# Patient Record
Sex: Male | Born: 1940 | Race: White | Hispanic: No | Smoking: Former smoker
Health system: Southern US, Community
[De-identification: ages and names within clinical notes are randomized; demographics above are authoritative.]

## PROBLEM LIST (undated history)

## (undated) DIAGNOSIS — J349 Unspecified disorder of nose and nasal sinuses: Secondary | ICD-10-CM

## (undated) DIAGNOSIS — M1990 Unspecified osteoarthritis, unspecified site: Secondary | ICD-10-CM

## (undated) DIAGNOSIS — R069 Unspecified abnormalities of breathing: Secondary | ICD-10-CM

## (undated) DIAGNOSIS — M67919 Unspecified disorder of synovium and tendon, unspecified shoulder: Secondary | ICD-10-CM

## (undated) DIAGNOSIS — N400 Enlarged prostate without lower urinary tract symptoms: Secondary | ICD-10-CM

## (undated) DIAGNOSIS — I10 Essential (primary) hypertension: Secondary | ICD-10-CM

## (undated) DIAGNOSIS — R0602 Shortness of breath: Secondary | ICD-10-CM

## (undated) DIAGNOSIS — C801 Malignant (primary) neoplasm, unspecified: Secondary | ICD-10-CM

## (undated) DIAGNOSIS — K635 Polyp of colon: Secondary | ICD-10-CM

## (undated) HISTORY — DX: Shortness of breath: R06.02

## (undated) HISTORY — DX: Unspecified abnormalities of breathing: R06.9

## (undated) HISTORY — PX: TONSILLECTOMY: SUR1361

## (undated) HISTORY — DX: Essential (primary) hypertension: I10

## (undated) HISTORY — DX: Unspecified disorder of nose and nasal sinuses: J34.9

## (undated) HISTORY — DX: Polyp of colon: K63.5

---

## 2005-05-31 ENCOUNTER — Ambulatory Visit: Payer: Self-pay | Admitting: Internal Medicine

## 2005-07-24 ENCOUNTER — Ambulatory Visit: Payer: Self-pay | Admitting: Internal Medicine

## 2005-07-31 ENCOUNTER — Ambulatory Visit: Payer: Self-pay | Admitting: Internal Medicine

## 2005-08-21 ENCOUNTER — Ambulatory Visit: Payer: Self-pay | Admitting: Internal Medicine

## 2005-08-23 ENCOUNTER — Ambulatory Visit: Payer: Self-pay | Admitting: Gastroenterology

## 2005-09-17 ENCOUNTER — Ambulatory Visit: Payer: Self-pay | Admitting: Gastroenterology

## 2005-09-17 ENCOUNTER — Encounter (INDEPENDENT_AMBULATORY_CARE_PROVIDER_SITE_OTHER): Payer: Self-pay | Admitting: *Deleted

## 2005-10-02 ENCOUNTER — Ambulatory Visit: Payer: Self-pay | Admitting: Internal Medicine

## 2005-11-19 ENCOUNTER — Ambulatory Visit: Payer: Self-pay | Admitting: Internal Medicine

## 2005-12-04 ENCOUNTER — Ambulatory Visit: Admission: RE | Admit: 2005-12-04 | Discharge: 2005-12-04 | Payer: Self-pay | Admitting: Orthopedic Surgery

## 2006-02-04 ENCOUNTER — Inpatient Hospital Stay (HOSPITAL_COMMUNITY): Admission: RE | Admit: 2006-02-04 | Discharge: 2006-02-07 | Payer: Self-pay | Admitting: Orthopedic Surgery

## 2006-08-14 ENCOUNTER — Ambulatory Visit: Payer: Self-pay | Admitting: Internal Medicine

## 2006-09-26 ENCOUNTER — Telehealth: Payer: Self-pay | Admitting: Internal Medicine

## 2006-10-04 ENCOUNTER — Telehealth: Payer: Self-pay | Admitting: Internal Medicine

## 2006-10-07 ENCOUNTER — Ambulatory Visit: Payer: Self-pay | Admitting: Internal Medicine

## 2006-10-07 DIAGNOSIS — F411 Generalized anxiety disorder: Secondary | ICD-10-CM | POA: Insufficient documentation

## 2006-10-07 DIAGNOSIS — Z87898 Personal history of other specified conditions: Secondary | ICD-10-CM | POA: Insufficient documentation

## 2006-10-08 ENCOUNTER — Telehealth: Payer: Self-pay | Admitting: Internal Medicine

## 2006-10-11 ENCOUNTER — Ambulatory Visit: Payer: Self-pay | Admitting: Internal Medicine

## 2006-10-14 ENCOUNTER — Ambulatory Visit: Payer: Self-pay | Admitting: Internal Medicine

## 2006-10-14 LAB — CONVERTED CEMR LAB
Basophils Absolute: 0 10*3/uL (ref 0.0–0.1)
Blood in Urine, dipstick: NEGATIVE
Calcium: 8.4 mg/dL (ref 8.4–10.5)
Chloride: 99 meq/L (ref 96–112)
Ferritin: 25.7 ng/mL (ref 22.0–322.0)
GFR calc Af Amer: 145 mL/min
GFR calc non Af Amer: 120 mL/min
GFR calc non Af Amer: 102 mL/min
Glucose, Bld: 103 mg/dL — ABNORMAL HIGH (ref 70–99)
Glucose, Bld: 93 mg/dL (ref 70–99)
Glucose, Urine, Semiquant: NEGATIVE
Hemoglobin: 13.5 g/dL (ref 13.0–17.0)
Hemoglobin: 14 g/dL (ref 13.0–17.0)
Lymphocytes Relative: 21.4 % (ref 12.0–46.0)
MCHC: 35.4 g/dL (ref 30.0–36.0)
Monocytes Absolute: 0.6 10*3/uL (ref 0.2–0.7)
Monocytes Relative: 10.8 % (ref 3.0–11.0)
Neutro Abs: 3.4 10*3/uL (ref 1.4–7.7)
Nitrite: NEGATIVE
Protein, U semiquant: NEGATIVE
Sodium: 126 meq/L — ABNORMAL LOW (ref 135–145)
Vitamin B-12: 282 pg/mL (ref 211–911)
WBC Urine, dipstick: NEGATIVE
pH: 7.5

## 2006-10-22 ENCOUNTER — Telehealth: Payer: Self-pay | Admitting: Internal Medicine

## 2007-03-06 HISTORY — PX: JOINT REPLACEMENT: SHX530

## 2007-03-06 HISTORY — PX: KNEE ARTHROPLASTY: SHX992

## 2007-03-24 ENCOUNTER — Ambulatory Visit: Payer: Self-pay | Admitting: Internal Medicine

## 2007-03-24 LAB — CONVERTED CEMR LAB
Blood in Urine, dipstick: NEGATIVE
Glucose, Urine, Semiquant: NEGATIVE
Specific Gravity, Urine: 1.015
WBC Urine, dipstick: NEGATIVE
pH: 7

## 2007-03-25 DIAGNOSIS — M1990 Unspecified osteoarthritis, unspecified site: Secondary | ICD-10-CM | POA: Insufficient documentation

## 2007-04-08 ENCOUNTER — Telehealth: Payer: Self-pay | Admitting: Internal Medicine

## 2007-04-28 ENCOUNTER — Telehealth: Payer: Self-pay | Admitting: Internal Medicine

## 2007-06-27 ENCOUNTER — Ambulatory Visit: Payer: Self-pay | Admitting: Internal Medicine

## 2007-06-27 DIAGNOSIS — E538 Deficiency of other specified B group vitamins: Secondary | ICD-10-CM | POA: Insufficient documentation

## 2007-06-27 DIAGNOSIS — E559 Vitamin D deficiency, unspecified: Secondary | ICD-10-CM | POA: Insufficient documentation

## 2007-09-10 ENCOUNTER — Telehealth: Payer: Self-pay | Admitting: *Deleted

## 2007-09-18 ENCOUNTER — Encounter: Payer: Self-pay | Admitting: Internal Medicine

## 2007-10-16 ENCOUNTER — Telehealth: Payer: Self-pay | Admitting: Internal Medicine

## 2007-10-23 ENCOUNTER — Telehealth: Payer: Self-pay | Admitting: Internal Medicine

## 2007-11-11 ENCOUNTER — Encounter: Payer: Self-pay | Admitting: Internal Medicine

## 2007-11-18 ENCOUNTER — Telehealth: Payer: Self-pay | Admitting: Internal Medicine

## 2007-11-25 ENCOUNTER — Telehealth: Payer: Self-pay | Admitting: Internal Medicine

## 2007-12-16 ENCOUNTER — Telehealth: Payer: Self-pay | Admitting: Internal Medicine

## 2008-01-02 ENCOUNTER — Ambulatory Visit: Payer: Self-pay | Admitting: Internal Medicine

## 2008-01-02 ENCOUNTER — Encounter: Payer: Self-pay | Admitting: Internal Medicine

## 2008-01-15 ENCOUNTER — Telehealth: Payer: Self-pay | Admitting: *Deleted

## 2008-01-19 ENCOUNTER — Ambulatory Visit: Payer: Self-pay | Admitting: Internal Medicine

## 2008-01-25 ENCOUNTER — Encounter: Payer: Self-pay | Admitting: Internal Medicine

## 2008-02-26 ENCOUNTER — Ambulatory Visit: Payer: Self-pay | Admitting: Family Medicine

## 2008-04-13 ENCOUNTER — Telehealth (INDEPENDENT_AMBULATORY_CARE_PROVIDER_SITE_OTHER): Payer: Self-pay | Admitting: *Deleted

## 2008-04-16 ENCOUNTER — Telehealth (INDEPENDENT_AMBULATORY_CARE_PROVIDER_SITE_OTHER): Payer: Self-pay | Admitting: *Deleted

## 2008-04-22 ENCOUNTER — Encounter: Payer: Self-pay | Admitting: Adult Health

## 2008-04-28 ENCOUNTER — Encounter: Payer: Self-pay | Admitting: Internal Medicine

## 2008-06-09 ENCOUNTER — Telehealth: Payer: Self-pay | Admitting: Internal Medicine

## 2008-08-18 ENCOUNTER — Ambulatory Visit: Payer: Self-pay | Admitting: Family Medicine

## 2008-08-18 DIAGNOSIS — N433 Hydrocele, unspecified: Secondary | ICD-10-CM | POA: Insufficient documentation

## 2008-10-08 ENCOUNTER — Ambulatory Visit: Payer: Self-pay | Admitting: Family Medicine

## 2009-01-26 ENCOUNTER — Ambulatory Visit: Payer: Self-pay | Admitting: Family Medicine

## 2009-01-31 LAB — CONVERTED CEMR LAB
ALT: 21 units/L (ref 0–53)
AST: 21 units/L (ref 0–37)
BUN: 8 mg/dL (ref 6–23)
Basophils Absolute: 0 10*3/uL (ref 0.0–0.1)
Bilirubin, Direct: 0.1 mg/dL (ref 0.0–0.3)
Calcium: 8.7 mg/dL (ref 8.4–10.5)
Cholesterol: 150 mg/dL (ref 0–200)
Creatinine, Ser: 0.8 mg/dL (ref 0.4–1.5)
Eosinophils Relative: 3.4 % (ref 0.0–5.0)
GFR calc non Af Amer: 101.68 mL/min (ref >60)
HDL: 27.1 mg/dL — ABNORMAL LOW (ref >39.00)
LDL Cholesterol: 110 mg/dL — ABNORMAL HIGH (ref 0–99)
Lymphocytes Relative: 21.5 % (ref 12.0–46.0)
Monocytes Relative: 9.9 % (ref 3.0–12.0)
Neutrophils Relative %: 64.9 % (ref 43.0–77.0)
Platelets: 220 10*3/uL (ref 150.0–400.0)
Potassium: 4.2 meq/L (ref 3.5–5.1)
RDW: 11.6 % (ref 11.5–14.6)
Total Bilirubin: 1.1 mg/dL (ref 0.3–1.2)
VLDL: 13.2 mg/dL (ref 0.0–40.0)
Vitamin B-12: 230 pg/mL (ref 211–911)
WBC: 5.2 10*3/uL (ref 4.5–10.5)

## 2009-12-08 ENCOUNTER — Ambulatory Visit: Payer: Self-pay | Admitting: Family Medicine

## 2009-12-08 DIAGNOSIS — N400 Enlarged prostate without lower urinary tract symptoms: Secondary | ICD-10-CM | POA: Insufficient documentation

## 2009-12-12 LAB — CONVERTED CEMR LAB: PSA: 0.36 ng/mL (ref 0.10–4.00)

## 2009-12-14 ENCOUNTER — Ambulatory Visit: Payer: Self-pay | Admitting: Family Medicine

## 2009-12-16 ENCOUNTER — Ambulatory Visit: Payer: Self-pay | Admitting: Family Medicine

## 2010-04-04 NOTE — Assessment & Plan Note (Signed)
Summary: health examination form to be completed/njr   Vital Signs:  Patient profile:   70 year old male Weight:      176 pounds O2 Sat:      98 % Temp:     98 degrees F Pulse rate:   78 / minute BP sitting:   140 / 90  Vitals Entered By: Pura Spice, RN (December 08, 2009 9:41 AM) CC: form completion  for substituting  school system    History of Present Illness: Here for a prostate check and to have a form filled out for [facility]. He feels fine and has no concerns. He is urinating freely. He has not seen Dr. Vonita Moss for over a year.   Allergies (verified): No Known Drug Allergies  Past History:  Past Medical History: Anxiety COPD by Cxray?  see normal   Spirometry  2009    on no meds Osteoarthritis  knees ? RLS Benign prostatic hypertrophy  Past Surgical History: Reviewed history from 08/18/2008 and no changes required. right Total knee replacement 2007 per Dr. Turner Daniels  Family History: Reviewed history from 01/19/2008 and no changes required. unremarkable Neg for lung disease  Social History: Reviewed history from 01/19/2008 and no changes required. stopped pipe smoking 6 months ago.  Alcohol use-yes  Married  Review of Systems  The patient denies anorexia, fever, weight loss, weight gain, vision loss, decreased hearing, hoarseness, chest pain, syncope, dyspnea on exertion, peripheral edema, prolonged cough, headaches, hemoptysis, abdominal pain, melena, hematochezia, severe indigestion/heartburn, hematuria, incontinence, genital sores, muscle weakness, suspicious skin lesions, transient blindness, difficulty walking, depression, unusual weight change, abnormal bleeding, enlarged lymph nodes, angioedema, breast masses, and testicular masses.    Physical Exam  General:  Well-developed,well-nourished,in no acute distress; alert,appropriate and cooperative throughout examination Neck:  No deformities, masses, or tenderness noted. Lungs:  Normal  respiratory effort, chest expands symmetrically. Lungs are clear to auscultation, no crackles or wheezes. Heart:  Normal rate and regular rhythm with an occasional ectopic beat.  S1 and S2 normal without gallop, murmur, click, rub or other extra sounds. Abdomen:  Bowel sounds positive,abdomen soft and non-tender without masses, organomegaly or hernias noted. Rectal:  No external abnormalities noted. Normal sphincter tone. No rectal masses or tenderness. Prostate:  no nodules, no asymmetry, no induration, and 1+ enlarged.   Msk:  No deformity or scoliosis noted of thoracic or lumbar spine.   Pulses:  R and L carotid,radial,femoral,dorsalis pedis and posterior tibial pulses are full and equal bilaterally Extremities:  No clubbing, cyanosis, edema, or deformity noted with normal full range of motion of all joints.   Neurologic:  No cranial nerve deficits noted. Station and gait are normal. Plantar reflexes are down-going bilaterally. DTRs are symmetrical throughout. Sensory, motor and coordinative functions appear intact.   Impression & Recommendations:  Problem # 1:  BENIGN PROSTATIC HYPERTROPHY (ICD-600.00)  Orders: Venipuncture ([address]) TLB-PSA (Prostate Specific Antigen) (84153-PSA) Specimen Handling (60454)  Problem # 2:  WEAKNESS (ICD-780.79)  Problem # 3:  OSTEOARTHRITIS (ICD-715.90)  Complete Medication List: 1)  Flomax 0.4 Mg Xr24h-cap (Tamsulosin hcl) .... Once daily  Other Orders: Tdap => 87yrs IM (09811) Admin 1st Vaccine (91478)  Patient Instructions: 1)  Form was filled out. He will return next week for a PPD.    Immunizations Administered:  Tetanus Vaccine:    Vaccine Type: Tdap    Site: left deltoid    Mfr: GlaxoSmithKline    Dose: 0.5 ml    Route: IM  Given by: Pura Spice, RN    Exp. Date: 12/23/2011    Lot #: [id number]    VIS given: 01/21/08 version given December 08, 2009.

## 2010-04-04 NOTE — Letter (Signed)
Summary: Health Examination Certificate  Health Examination Certificate   Imported By: Maryln Gottron 12/20/2009 12:28:53  _____________________________________________________________________  External Attachment:    Type:   Image     Comment:   External Document

## 2010-04-04 NOTE — Assessment & Plan Note (Signed)
Summary: TB test pt will come in around 430pm/njr   Nurse Visit   Allergies: No Known Drug Allergies  Immunizations Administered:  PPD Skin Test:    Vaccine Type: PPD    Site: left forearm    Mfr: Sanofi Pasteur    Dose: 0.1 ml    Route: ID    Given by: Pura Spice, RN    Exp. Date: 01/04/2010    Lot #: [id number]  Orders Added: 1)  TB Skin Test [86580] 2)  Admin 1st Vaccine [44034]

## 2010-04-04 NOTE — Assessment & Plan Note (Signed)
Summary: TB READING//ALP   Nurse Visit   Allergies: No Known Drug Allergies  PPD Results    Date of reading: 12/16/2009    Results: < 5mm    Interpretation: negative

## 2010-05-09 ENCOUNTER — Other Ambulatory Visit: Payer: Self-pay | Admitting: Dermatology

## 2010-07-21 NOTE — Op Note (Signed)
Shawn Gaines, Shawn Gaines                  ACCOUNT NO.:  [id number]   MEDICAL RECORD NO.:  [id number]          PATIENT TYPE:  INP   LOCATION:  5015                         FACILITY:  [facility]   PHYSICIAN:  Feliberto Gottron. Turner Daniels, M.D.   DATE OF BIRTH:  02/14/1941   DATE OF PROCEDURE:  02/04/2006  DATE OF DISCHARGE:                               OPERATIVE REPORT   PREOPERATIVE DIAGNOSIS:  Osteoarthritis right knee.   POSTOPERATIVE DIAGNOSIS:  Osteoarthritis right knee.   PROCEDURE:  Right total knee arthroplasty using DePuy Sigma RP  components, 6 tibia, 5 femur, 10 posterior stabilized spacer and a 41 mm  patellar button.  DePuy HV cement with 1500 mg Zinacef.   SURGEON:  Feliberto Gottron. Turner Daniels, M.D.   ASSISTANTLaural Benes. Jannet Mantis.   ANESTHETIC:  General LMA.   ESTIMATED BLOOD LOSS:  Minimal.   FLUID REPLACEMENT:  Liter crystalloid.   URINE OUTPUT:  300 mL.   TOURNIQUET TIME:  1 hour and 35 minutes.   INDICATIONS FOR PROCEDURE:  70 year old man with end-stage arthritis of  both knees, right worse than left.  He has failed conservative treatment  and desires elective right total knee arthroplasty.  He is down to bone-  on-bone arthritic changes.  He has thought about this for quite some  time, many months to years.  He recently retired as a Runner, broadcasting/film/video.  We had a  thorough discussion of the pre and postoperative course and questions  were answered.  Risks and benefits of surgery were reviewed as well.   DESCRIPTION OF PROCEDURE:  The patient identified by armband, taken the  operating room at [facility].  Appropriate anesthetic monitors  were attached and general LMA anesthesia induced with the patient in  supine position.  Foley catheter was inserted.  Lateral post applied to  the table and foot positioner and tourniquet applied high the right  thigh. The right lower extremity was then prepped, draped in usual  sterile fashion from the toes to the tourniquet and it should be noted  that  just prior to coming into the operative suite.  He did receive a  gram of Ancef IV.  The limb was then wrapped with an Esmarch bandage.  The tourniquet inflated 350 mmHg with the knee bent.  We made an  anterior midline incision 18 cm in length starting out a centimeter  distal to and a centimeter medial to the tibial tubercle going up over  the patella and proximal to the patella for about 5 or 6 cm through the  skin and subcutaneous tissue down the level of transverse retinaculum  which was reflected medially.  Allowing a standard medial parapatellar  arthrotomy.  The prepatellar fat pad was resected.  The superficial  medial collateral ligament was elevated off the anteromedial tibia with  the Bovie with the knee in extension.  The patella was then everted and  the knee flexed up to 150 degrees.  This allowed resection of the rest  the prepatellar fat pad, the anterior one half of both menisci.  We  continued dissection  of the superficial medial collateral ligament  around posteriorly leaving it intact distally on the tibia to release  the superficial medial collateral ligament since he had a varus knee  deformity and some prominent medial osteophytes that were removed from  the tibia and the femur as well as lateral osteophytes.  The ACL and PCL  were then removed with electrocautery and the tibia translated  anteriorly using a McHale retractor through the notch, a lateral Homan a  posteromedial Z.  The proximal tibia was entered with the DePuy step  drill followed by the 0 degrees posterior slope cutting guide removing 3-  4 mm of bone medially and 8 to 9 mm laterally with the power oscillating  saw.  We then entered the distal femur 2 mm anterior to the PCL origin  with a step drill followed by 5 degrees right distal femoral cutting  guide set a 12 mm position because of his rather large size.  The tibia  sized out to a 6.  The cutting guide was pinned into place. 5 degrees  valgus,  right and the distal femoral cut accomplished, we measured for  #5 femoral component with the posterior referenced cutting guide and  pinned it in place in 3 degrees external rotation.  We then took the  chamfer cutting guide and hammered it into the pin holes and performed  our anterior, posterior and chamfer cuts without difficulty followed by  the Sigma RP box cut.  The everted patella was then measured with the  caliper 29 mm and we prepared the size for 41, set the cutting guide at  16 to remove 13 mm of bone, sized for 41 patellar button and drilled.  At this point the knee was once again hyperflexed.  Tibial retractors  placed and the 6 tibial baseplate was inserted, pinned into place.  Smokestack was then used to do the conical reaming.  We then inserted  the Delta fit keel trial.  At this point the #5 right trial femur was  hammered into place.  A 10 PS spacer was placed on the tibial baseplate  and a 41 poly button placed on the patella.  The knee came to full  extension flexed easily to 150 degrees and had slight ligamentous laxity  medially but then again we took the superficial medial collateral  ligament all the way around to the semimembranosus insertion to remove  osteophytes and this was not unexpected.  At this point the trial  components were removed.  All bony surfaces were water picked clean and  dry with suction and sponges, a double batch of DePuy HV cement with  1500 mg Zinacef was then mixed and applied to all bony and metallic  surfaces and in order we hammered into place, a #6 tibial baseplate and  removed excess cement, a #5 right femoral component removed excess  cement, 41 mm poly button and removed excess cement, and a 10 rotating  platform bearing was placed, the real one.  The knee was again reduced,  taken through range of motion, noted have excellent tracking.  Some more  excess cement was removed.  Medium hemostats were inserted in the lateral skin wound  and the parapatellar arthrotomy closed with running  #1 Vicryl suture.  The subcutaneous tissue with 0-0 and 2-0 undyed  Vicryl suture and skin with skin staples.  A dressing of Xeroform 4x4  dressing sponges, Webril and Ace wrap applied.  The tourniquet was let  down.  The  patient was then awakened and taken to the recovery room  without difficulty.      Feliberto Gottron. Turner Daniels, M.D.  Electronically Signed     FJR/MEDQ  D:  02/04/2006  T:  02/04/2006  Job:  [id number]

## 2010-07-21 NOTE — Letter (Signed)
November 30, 2005     Shawn Gaines, M.D.  [address]  [place], [place] [address]   RE:  KEEGHAN, BIALY  MRN:  [id number]  /  DOB:  Feb 26, 1941   Dear Dr. Thurston Gaines,   I have seen Mr. Heinrich Fertig, a 70 year old pipe-smoking married white  gentleman in my office, in regards to preoperative evaluation for a total  knee replacement.  He has no past history of cardiovascular disease or  documented pulmonary disease, although with his smoking an x-Tyjay done in  June 2007, did show some COPD.  His exercise, however, is limited only by  his degenerative joint disease.  He is not diabetic or hypertensive and has  no claudication symptoms.   PHYSICAL EXAMINATION:  VITAL SIGNS:  On exam in my office his weight was 184  pounds, temperature 97.1 degrees, pulse 70 with five or six premature beats  per minute, blood pressure 130/84.  CARDIOVASCULAR:  Within normal limits except for the premature beats on the  electrocardiogram.  He showed a normal sinus rhythm with unifocal PVCs.  He  was totally unaware of these symptoms.   I discussed with him preoperative medical rest and did not feel that his  PVCs on a baseline electrocardiogram were significant, based on his profile;  however, we did discuss perioperative beta blockers to decrease  cardiovascular complication risk.  He was given a trial of metoprolol 12.5  mg b.i.d., given 25 mg of a 30 that he can try this week and call us if he  has significant bradycardia.   I have told him that he should be a medically appropriate surgical candidate  for his upcoming total knee replacement.  Please let us know if we can help  you in any other way.    Sincerely,      Neta Mends. Fabian Sharp, MD     WKP/MedQ  DD:  11/30/2005  DT:  11/30/2005  Job #:  [id number]

## 2010-07-21 NOTE — Discharge Summary (Signed)
Shawn Gaines, Shawn Gaines                  ACCOUNT NO.:  [id number]   MEDICAL RECORD NO.:  [id number]          PATIENT TYPE:  INP   LOCATION:  5015                         FACILITY:  [facility]   PHYSICIAN:  Feliberto Gottron. Turner Daniels, M.D.   DATE OF BIRTH:  1940/11/17   DATE OF ADMISSION:  02/04/2006  DATE OF DISCHARGE:  02/07/2006                               DISCHARGE SUMMARY   PRIMARY DIAGNOSIS FOR THIS ADMISSION:  End-stage degenerative joint  disease of the right knee.   PROCEDURE WHILE IN HOSPITAL:  Right total knee arthroplasty.   HISTORY OF PRESENT ILLNESS:  The patient is a 70 year old male with end-  stage arthritis of both knees, right worse than left.  He has failed  conservative treatment and desires elective right total knee  arthroplasty.  He is down to bone on bone arthritic changes.  He has  thought about this for quite some time, many months to years.  He is  recently retired as a Runner, broadcasting/film/video.  After thorough discussion of the pre and  postoperative course and questions answered the patient wishes to  proceed knowing the risks and benefits of the surgery after review.   ALLERGIES:  THE PATIENT HAS NO KNOWN DRUG ALLERGIES.   MEDICATIONS AT TIME OF ADMISSION:  None.   PAST MEDICAL HISTORY:  __________  disease as well as a history of DJD.   SURGICAL HISTORY:  Tonsillectomy.   SOCIAL HISTORY:  Positive tobacco, a pipe per day, 4 to 5 alcoholic  beverages per week.  No IV drug abuse.  He is retired and married.   FAMILY HISTORY:  Mother died at age 77 with a history of DJD.  Father  died at 8 with a history of stroke.   REVIEW OF SYSTEMS:  Positive for glasses, decreased hearing, frequent  urination.  He denies any shortness of breath, chest pain or recent  illness.   PHYSICAL EXAMINATION:  VITAL SIGNS:  Pulse is 80, respirations 20,  temperature 97.7, blood pressure 150/80.  He is a 6 foot, 180 pound  male.  HEENT:  Head is normocephalic, atraumatic.  Ears:  TMs are clear.  Eyes:  Pupils equal, round, and reactive to light and accommodation.  Nose:  Patent.  Throat:  Benign.  He has partial dentures.  No difficulties  with gums.  NECK:  Supple.  Full range of motion.  CHEST:  Clear to auscultation and percussion.  HEART:  Regular rate and rhythm.  ABDOMEN:  Soft and nontender.  EXTREMITIES:  Right knee 5-110 degrees positive crepitus and trace  effusion, positive pain, stable with  __________ .   RADIOGRAPHS:  X-Dennis shows bone on bone changes of bilateral knees.   PREOPERATIVE LABS:  Including CBC, CT, chest x-Damarea, EKG, PT and PTT are  within normal limits with the exception of sodium of 129.   HOSPITAL COURSE:  On the day of admission the patient was taken to the  operating room at [facility] where he underwent a right total knee  arthroplasty and a few segment RP components, a #6 tibia, #5 femur, a 10  posterior, a stable S spacer and 41 mm patellar button.  All components  were cemented with components that had been impregnated with  antibiotics.  The patient was placed on perioperative antibiotics.  He  was placed on postoperative Coumadin prophylaxis with bridging Lovenox  treatment.  He was placed on postoperative PCA Dilaudid for pain  control.  A medium Hemovac drain was placed.  The physical therapy began  in the PACU with the CPM.  On postoperative day 1 the patient was awake  and alert.  No emesis, minimal nausea.  Vital signs were stable.  Hemoglobin 10.8, WBC of 8.8.  PT of 12.9.  Drain output of 550 mL over  shift, which was discontinued.  Dressing was clean and dry.  Neurovascularly intact.  He is, otherwise, stable and continuing with  physical therapy, as well as his Coumadin prophylaxis.  Postoperative  day 2, the patient was complaining of pain.  Vital signs were stable.  He was afebrile.  Hemoglobin 9.8, WBC 7.6.  INR 1.4.  Dressing was dry.  Abdomen was soft and nontender.  He was otherwise neurovascularly intact  distally.  PCO was  discontinued, IV and Foley was discontinued.  It was  felt that he would benefit from home physical therapy.  Postoperative  day 3 the patient was awake and alert, voiding without difficulty,  walking around the nursing station, taking p.o. as well.  He did  complain of increased swelling.  Vital signs were stable.  Hemoglobin  9.9, WBC of 10.1.  INR 1.6.  Wound was clean and dry.  CPM 0 to 50  degrees.  Urine output 3,500 mL.  The leg was 1 to 2+ swollen with no  increased pain with moving the foot up and down.  He was sent for venous  Dopplers to rule out DVT and this was negative.  Because of this and the  fact that he was otherwise medically stable and orthopedically improved,  he was discharge home in the care of his family.  At the time of  transfer, he was independent in his transfers and was progressing well  with physical therapy.  His pain was well controlled on oral pain  medicines and these will be continued on a home basis.  His home  medications were not an issue, as he had preoperatively.  He was given  prescriptions for Percocet, for Coumadin and for Robaxin.  He should  return to clinic in one weeks' time, sooner should he have any increased  fever over 101.  Activity and weightbearing as tolerated with total knee  precautions.  Diet is regular.  Patient to see [place] on one weeks' time for  followup check.      Laural Benes. Jannet Mantis.      Feliberto Gottron. Turner Daniels, M.D.  Electronically Signed    JBR/MEDQ  D:  03/25/2006  T:  03/26/2006  Job:  [id number]

## 2010-11-26 ENCOUNTER — Emergency Department (HOSPITAL_COMMUNITY)
Admission: EM | Admit: 2010-11-26 | Discharge: 2010-11-27 | Disposition: A | Discharge status: Home / Self Care | Payer: Medicare Other | Attending: Emergency Medicine | Admitting: Emergency Medicine

## 2010-11-26 ENCOUNTER — Emergency Department (HOSPITAL_COMMUNITY): Payer: Medicare Other

## 2010-11-26 DIAGNOSIS — T63461A Toxic effect of venom of wasps, accidental (unintentional), initial encounter: Secondary | ICD-10-CM | POA: Insufficient documentation

## 2010-11-26 DIAGNOSIS — T6391XA Toxic effect of contact with unspecified venomous animal, accidental (unintentional), initial encounter: Secondary | ICD-10-CM | POA: Insufficient documentation

## 2010-11-26 DIAGNOSIS — R070 Pain in throat: Secondary | ICD-10-CM | POA: Insufficient documentation

## 2010-11-26 DIAGNOSIS — R220 Localized swelling, mass and lump, head: Secondary | ICD-10-CM | POA: Insufficient documentation

## 2010-11-26 HISTORY — DX: Benign prostatic hyperplasia without lower urinary tract symptoms: N40.0

## 2010-11-27 ENCOUNTER — Encounter (HOSPITAL_COMMUNITY): Payer: Self-pay

## 2010-11-27 ENCOUNTER — Emergency Department (HOSPITAL_COMMUNITY): Payer: Medicare Other

## 2010-11-27 LAB — BASIC METABOLIC PANEL
BUN: 12 mg/dL (ref 6–23)
CO2: 27 mEq/L (ref 19–32)
Calcium: 8.6 mg/dL (ref 8.4–10.5)
Chloride: 98 mEq/L (ref 96–112)
Creatinine, Ser: 0.66 mg/dL (ref 0.50–1.35)

## 2010-11-27 MED ORDER — IOHEXOL 300 MG/ML  SOLN
75.0000 mL | Freq: Once | INTRAMUSCULAR | Status: AC | PRN
  Administered 2010-11-27: 75 mL via INTRAVENOUS

## 2011-05-03 ENCOUNTER — Encounter (HOSPITAL_COMMUNITY): Payer: Self-pay | Admitting: *Deleted

## 2011-05-03 ENCOUNTER — Other Ambulatory Visit: Payer: Self-pay | Admitting: Orthopedic Surgery

## 2011-05-03 ENCOUNTER — Other Ambulatory Visit (HOSPITAL_COMMUNITY): Payer: Self-pay | Admitting: *Deleted

## 2011-05-03 MED ORDER — CEFAZOLIN SODIUM-DEXTROSE 2-3 GM-% IV SOLR
2.0000 g | INTRAVENOUS | Status: AC
  Administered 2011-05-04: 2 g via INTRAVENOUS

## 2011-05-03 MED ORDER — DEXTROSE-NACL 5-0.45 % IV SOLN: INTRAVENOUS | Status: DC

## 2011-05-03 MED ORDER — CHLORHEXIDINE GLUCONATE 4 % EX LIQD
60.0000 mL | Freq: Once | CUTANEOUS | Status: DC
  Filled 2011-05-03: qty 60

## 2011-05-03 NOTE — H&P (Signed)
  Subjective: Shawn Gaines is a patient familiar to our practice who was last seen by Dr. Turner Gaines in 2009.  He is status post right total knee arthroplasty in December 2007.  He reports that his right knee is doing very well.  Unfortunately he has developed significant arthritis in his left knee.  He has been treated by Dr. Charlett Gaines with cortisone injections and Visco supplementation.  At this point, his pain is so severe that it limits his activities and interferes with his sleep at night.  He reports that his knee pain is also affecting his balance.  PMH:  No known drug allergies.  Depression.  PSH: Right TKA, Tonsillectomy  Family Hx: Arthritis  Social Hx: Denies tobacco.  Occasional EtOH.  Retired Runner, broadcasting/film/video.  ROS: Positive for glasses and hearing loss.  Patient denies dizziness, nausea, fever, chills, vomiting, shortness of breath, chest pain, loss of appetite, or rash.    PHYSICAL EXAM: Well-developed, well-nourished.  Awake, alert, and oriented x3.  Extraocular motion is intact.  No use of accessory respiratory muscles for breathing.   Cardiovascular exam reveals a regular rhythm.  Skin is intact without cuts, scrapes, or abrasions. Examination of the left knee demonstrates a trace effusion.  He has a palpable Baker cyst.  He is tender to palpation along the medial joint line.  Range of motion is 5-120.  Neurovascular exam is within normal limits.  X-rays: 4 views of the left knee demonstrate end-stage arthritis in the medial compartment with bony erosion.  He has well placed, well fixed total knee components on the right side.  Impression: #1 end-stage arthritis of the left knee. #2 status post right total knee arthroplasty in December of 2007  Plan: We have discussed the options in detail with Shawn Gaines today.  At this point he has exhausted all conservative measures and desires elective left total knee arthroplasty. All risks and benefits of surgery were discussed with the patient.

## 2011-05-04 ENCOUNTER — Encounter (HOSPITAL_COMMUNITY)
Admission: RE | Disposition: A | Discharge status: Skilled Nursing Facility | Payer: Self-pay | Source: Ambulatory Visit | Attending: Orthopedic Surgery

## 2011-05-04 ENCOUNTER — Inpatient Hospital Stay (HOSPITAL_COMMUNITY)
Admission: RE | Admit: 2011-05-04 | Discharge: 2011-05-07 | DRG: 470 | Disposition: A | Discharge status: Skilled Nursing Facility | Payer: Medicare Other | Source: Ambulatory Visit | Attending: Orthopedic Surgery | Admitting: Orthopedic Surgery

## 2011-05-04 ENCOUNTER — Encounter (HOSPITAL_COMMUNITY): Payer: Self-pay | Admitting: Registered Nurse

## 2011-05-04 ENCOUNTER — Encounter (HOSPITAL_COMMUNITY): Payer: Self-pay | Admitting: *Deleted

## 2011-05-04 ENCOUNTER — Ambulatory Visit (HOSPITAL_COMMUNITY): Payer: Medicare Other

## 2011-05-04 ENCOUNTER — Ambulatory Visit (HOSPITAL_COMMUNITY): Payer: Medicare Other | Admitting: Registered Nurse

## 2011-05-04 ENCOUNTER — Encounter (HOSPITAL_COMMUNITY): Payer: Self-pay

## 2011-05-04 DIAGNOSIS — M1712 Unilateral primary osteoarthritis, left knee: Secondary | ICD-10-CM

## 2011-05-04 DIAGNOSIS — Z96659 Presence of unspecified artificial knee joint: Secondary | ICD-10-CM

## 2011-05-04 DIAGNOSIS — IMO0002 Reserved for concepts with insufficient information to code with codable children: Principal | ICD-10-CM | POA: Diagnosis present

## 2011-05-04 DIAGNOSIS — Z85828 Personal history of other malignant neoplasm of skin: Secondary | ICD-10-CM

## 2011-05-04 DIAGNOSIS — M1710 Unilateral primary osteoarthritis, unspecified knee: Principal | ICD-10-CM | POA: Diagnosis present

## 2011-05-04 HISTORY — DX: Malignant (primary) neoplasm, unspecified: C80.1

## 2011-05-04 HISTORY — DX: Unspecified osteoarthritis, unspecified site: M19.90

## 2011-05-04 HISTORY — DX: Unspecified disorder of synovium and tendon, unspecified shoulder: M67.919

## 2011-05-04 HISTORY — PX: TOTAL KNEE ARTHROPLASTY: SHX125

## 2011-05-04 LAB — SURGICAL PCR SCREEN
MRSA, PCR: NEGATIVE
Staphylococcus aureus: POSITIVE — AB

## 2011-05-04 LAB — DIFFERENTIAL
Lymphs Abs: 1.2 10*3/uL (ref 0.7–4.0)
Monocytes Absolute: 0.5 10*3/uL (ref 0.1–1.0)
Monocytes Relative: 12 % (ref 3–12)
Neutro Abs: 2.3 10*3/uL (ref 1.7–7.7)
Neutrophils Relative %: 53 % (ref 43–77)

## 2011-05-04 LAB — CBC
HCT: 41 % (ref 39.0–52.0)
Hemoglobin: 14.5 g/dL (ref 13.0–17.0)
MCH: 33 pg (ref 26.0–34.0)
RBC: 4.4 MIL/uL (ref 4.22–5.81)

## 2011-05-04 LAB — BASIC METABOLIC PANEL
BUN: 13 mg/dL (ref 6–23)
Chloride: 103 mEq/L (ref 96–112)
GFR calc Af Amer: >90 mL/min (ref >90)
GFR calc non Af Amer: >90 mL/min (ref >90)
Glucose, Bld: 98 mg/dL (ref 70–99)
Potassium: 4.1 mEq/L (ref 3.5–5.1)
Sodium: 136 mEq/L (ref 135–145)

## 2011-05-04 LAB — TYPE AND SCREEN: ABO/RH(D): A POS

## 2011-05-04 LAB — PROTIME-INR: INR: 1.02 (ref 0.00–1.49)

## 2011-05-04 SURGERY — ARTHROPLASTY, KNEE, TOTAL
Anesthesia: General | Site: Knee | Laterality: Left | Wound class: Clean

## 2011-05-04 MED ORDER — SODIUM CHLORIDE 0.9 % IR SOLN
Status: DC | PRN
  Administered 2011-05-04: 4000 mL

## 2011-05-04 MED ORDER — MIDAZOLAM HCL 2 MG/2ML IJ SOLN
2.0000 mg | Freq: Once | INTRAMUSCULAR | Status: AC
  Administered 2011-05-04: 2 mg via INTRAVENOUS

## 2011-05-04 MED ORDER — MIDAZOLAM HCL 2 MG/2ML IJ SOLN
INTRAMUSCULAR | Status: AC
  Filled 2011-05-04: qty 2

## 2011-05-04 MED ORDER — CEFAZOLIN SODIUM-DEXTROSE 2-3 GM-% IV SOLR
INTRAVENOUS | Status: AC
  Filled 2011-05-04: qty 50

## 2011-05-04 MED ORDER — OXYCODONE-ACETAMINOPHEN 5-325 MG PO TABS
1.0000 | ORAL_TABLET | ORAL | Status: DC | PRN
  Administered 2011-05-06: 2 via ORAL
  Administered 2011-05-06: 1 via ORAL
  Administered 2011-05-06: 2 via ORAL
  Administered 2011-05-06: 1 via ORAL
  Administered 2011-05-07 (×3): 2 via ORAL
  Filled 2011-05-04: qty 2
  Filled 2011-05-04: qty 1
  Filled 2011-05-04 (×5): qty 2

## 2011-05-04 MED ORDER — DEXAMETHASONE SODIUM PHOSPHATE 10 MG/ML IJ SOLN
INTRAMUSCULAR | Status: DC | PRN
  Administered 2011-05-04: 4 mg via INTRAVENOUS

## 2011-05-04 MED ORDER — ONDANSETRON HCL 4 MG/2ML IJ SOLN: 4.0000 mg | Freq: Four times a day (QID) | INTRAMUSCULAR | Status: DC | PRN

## 2011-05-04 MED ORDER — METOCLOPRAMIDE HCL 5 MG PO TABS
5.0000 mg | ORAL_TABLET | Freq: Three times a day (TID) | ORAL | Status: DC | PRN
  Filled 2011-05-04: qty 2

## 2011-05-04 MED ORDER — METOCLOPRAMIDE HCL 5 MG/ML IJ SOLN: 5.0000 mg | Freq: Three times a day (TID) | INTRAMUSCULAR | Status: DC | PRN

## 2011-05-04 MED ORDER — FLEET ENEMA 7-19 GM/118ML RE ENEM: 1.0000 | ENEMA | Freq: Once | RECTAL | Status: AC | PRN

## 2011-05-04 MED ORDER — SILODOSIN 8 MG PO CAPS
8.0000 mg | ORAL_CAPSULE | Freq: Every day | ORAL | Status: DC
  Administered 2011-05-05 – 2011-05-06 (×2): 8 mg via ORAL
  Filled 2011-05-04 (×5): qty 1

## 2011-05-04 MED ORDER — ACETAMINOPHEN 325 MG PO TABS: 650.0000 mg | ORAL_TABLET | Freq: Four times a day (QID) | ORAL | Status: DC | PRN

## 2011-05-04 MED ORDER — HYDROMORPHONE HCL PF 1 MG/ML IJ SOLN
0.5000 mg | INTRAMUSCULAR | Status: DC | PRN
  Administered 2011-05-04 – 2011-05-05 (×7): 1 mg via INTRAVENOUS
  Filled 2011-05-04 (×7): qty 1

## 2011-05-04 MED ORDER — WARFARIN VIDEO
Freq: Once | Status: AC
  Administered 2011-05-05: 15:00:00

## 2011-05-04 MED ORDER — LACTATED RINGERS IV SOLN
INTRAVENOUS | Status: DC | PRN
  Administered 2011-05-04 (×2): via INTRAVENOUS

## 2011-05-04 MED ORDER — BISACODYL 10 MG RE SUPP: 10.0000 mg | Freq: Every day | RECTAL | Status: DC | PRN

## 2011-05-04 MED ORDER — ONDANSETRON HCL 4 MG/2ML IJ SOLN
INTRAMUSCULAR | Status: DC | PRN
  Administered 2011-05-04: 4 mg via INTRAVENOUS

## 2011-05-04 MED ORDER — ACETAMINOPHEN 10 MG/ML IV SOLN
INTRAVENOUS | Status: AC
  Filled 2011-05-04: qty 100

## 2011-05-04 MED ORDER — KCL IN DEXTROSE-NACL 20-5-0.45 MEQ/L-%-% IV SOLN
INTRAVENOUS | Status: DC
  Administered 2011-05-04: 16:00:00 via INTRAVENOUS
  Administered 2011-05-04: 125 mL via INTRAVENOUS
  Administered 2011-05-05 – 2011-05-06 (×4): via INTRAVENOUS
  Filled 2011-05-04 (×11): qty 1000

## 2011-05-04 MED ORDER — METHOCARBAMOL 500 MG PO TABS
500.0000 mg | ORAL_TABLET | Freq: Four times a day (QID) | ORAL | Status: DC | PRN
  Administered 2011-05-04 – 2011-05-07 (×10): 500 mg via ORAL
  Filled 2011-05-04 (×9): qty 1

## 2011-05-04 MED ORDER — ACETAMINOPHEN 10 MG/ML IV SOLN
INTRAVENOUS | Status: DC | PRN
  Administered 2011-05-04: 1000 mg via INTRAVENOUS

## 2011-05-04 MED ORDER — MUPIROCIN 2 % EX OINT
TOPICAL_OINTMENT | CUTANEOUS | Status: AC
  Filled 2011-05-04: qty 22

## 2011-05-04 MED ORDER — PHENOL 1.4 % MT LIQD: 1.0000 | OROMUCOSAL | Status: DC | PRN

## 2011-05-04 MED ORDER — ONDANSETRON HCL 4 MG/2ML IJ SOLN: 4.0000 mg | Freq: Once | INTRAMUSCULAR | Status: DC | PRN

## 2011-05-04 MED ORDER — WARFARIN SODIUM 5 MG PO TABS
5.0000 mg | ORAL_TABLET | Freq: Once | ORAL | Status: AC
  Administered 2011-05-04: 5 mg via ORAL
  Filled 2011-05-04 (×2): qty 1

## 2011-05-04 MED ORDER — HYDROCODONE-ACETAMINOPHEN 5-325 MG PO TABS
1.0000 | ORAL_TABLET | ORAL | Status: DC | PRN
  Administered 2011-05-05: 2 via ORAL
  Filled 2011-05-04: qty 2

## 2011-05-04 MED ORDER — METHOCARBAMOL 100 MG/ML IJ SOLN
500.0000 mg | Freq: Four times a day (QID) | INTRAVENOUS | Status: DC | PRN
  Filled 2011-05-04: qty 5

## 2011-05-04 MED ORDER — MENTHOL 3 MG MT LOZG: 1.0000 | LOZENGE | OROMUCOSAL | Status: DC | PRN

## 2011-05-04 MED ORDER — MIDAZOLAM HCL 5 MG/5ML IJ SOLN
INTRAMUSCULAR | Status: DC | PRN
  Administered 2011-05-04 (×2): 1 mg via INTRAVENOUS

## 2011-05-04 MED ORDER — ONDANSETRON HCL 4 MG PO TABS: 4.0000 mg | ORAL_TABLET | Freq: Four times a day (QID) | ORAL | Status: DC | PRN

## 2011-05-04 MED ORDER — ZOLPIDEM TARTRATE 5 MG PO TABS: 5.0000 mg | ORAL_TABLET | Freq: Every evening | ORAL | Status: DC | PRN

## 2011-05-04 MED ORDER — MAGNESIUM HYDROXIDE 400 MG/5ML PO SUSP: 30.0000 mL | Freq: Every day | ORAL | Status: DC | PRN

## 2011-05-04 MED ORDER — DIPHENHYDRAMINE HCL 12.5 MG/5ML PO ELIX: 12.5000 mg | ORAL_SOLUTION | ORAL | Status: DC | PRN

## 2011-05-04 MED ORDER — CEFUROXIME SODIUM 1.5 G IJ SOLR
INTRAMUSCULAR | Status: DC | PRN
  Administered 2011-05-04: 1.5 g

## 2011-05-04 MED ORDER — PROPOFOL 10 MG/ML IV EMUL
INTRAVENOUS | Status: DC | PRN
  Administered 2011-05-04: 180 mg via INTRAVENOUS

## 2011-05-04 MED ORDER — ACETAMINOPHEN 650 MG RE SUPP: 650.0000 mg | Freq: Four times a day (QID) | RECTAL | Status: DC | PRN

## 2011-05-04 MED ORDER — HYDROMORPHONE HCL PF 1 MG/ML IJ SOLN
INTRAMUSCULAR | Status: AC
  Filled 2011-05-04: qty 1

## 2011-05-04 MED ORDER — EPHEDRINE SULFATE 50 MG/ML IJ SOLN
INTRAMUSCULAR | Status: DC | PRN
  Administered 2011-05-04 (×2): 10 mg via INTRAVENOUS

## 2011-05-04 MED ORDER — FENTANYL CITRATE 0.05 MG/ML IJ SOLN
INTRAMUSCULAR | Status: DC | PRN
  Administered 2011-05-04 (×3): 50 ug via INTRAVENOUS
  Administered 2011-05-04: 150 ug via INTRAVENOUS
  Administered 2011-05-04: 50 ug via INTRAVENOUS

## 2011-05-04 MED ORDER — ACETAMINOPHEN 10 MG/ML IV SOLN: 1000.0000 mg | Freq: Four times a day (QID) | INTRAVENOUS | Status: DC

## 2011-05-04 MED ORDER — PATIENT'S GUIDE TO USING COUMADIN BOOK
Freq: Once | Status: DC
  Filled 2011-05-04 (×2): qty 1

## 2011-05-04 MED ORDER — KCL IN DEXTROSE-NACL 20-5-0.45 MEQ/L-%-% IV SOLN
INTRAVENOUS | Status: AC
  Administered 2011-05-04: 125 mL via INTRAVENOUS
  Filled 2011-05-04: qty 1000

## 2011-05-04 MED ORDER — HYDROMORPHONE HCL PF 1 MG/ML IJ SOLN
0.2500 mg | INTRAMUSCULAR | Status: DC | PRN
  Administered 2011-05-04 (×2): 0.5 mg via INTRAVENOUS
  Administered 2011-05-05: 2 mg via INTRAVENOUS

## 2011-05-04 SURGICAL SUPPLY — 60 items
BANDAGE ESMARK 6X9 LF (GAUZE/BANDAGES/DRESSINGS) ×1 IMPLANT
BLADE SAG 18X100X1.27 (BLADE) ×2 IMPLANT
BLADE SAW SGTL 13X75X1.27 (BLADE) ×2 IMPLANT
BLADE SURG ROTATE 9660 (MISCELLANEOUS) IMPLANT
BNDG CMPR 9X6 STRL LF SNTH (GAUZE/BANDAGES/DRESSINGS) ×1
BNDG CMPR MED 10X6 ELC LF (GAUZE/BANDAGES/DRESSINGS) ×1
BNDG ELASTIC 6X10 VLCR STRL LF (GAUZE/BANDAGES/DRESSINGS) ×2 IMPLANT
BNDG ESMARK 6X9 LF (GAUZE/BANDAGES/DRESSINGS) ×2
BOWL SMART MIX CTS (DISPOSABLE) ×2 IMPLANT
CEMENT HV SMART SET (Cement) ×4 IMPLANT
CLOTH BEACON ORANGE TIMEOUT ST (SAFETY) ×2 IMPLANT
COVER BACK TABLE 24X17X13 BIG (DRAPES) ×1 IMPLANT
COVER SURGICAL LIGHT HANDLE (MISCELLANEOUS) ×3 IMPLANT
CUFF TOURNIQUET SINGLE 34IN LL (TOURNIQUET CUFF) ×1 IMPLANT
CUFF TOURNIQUET SINGLE 44IN (TOURNIQUET CUFF) IMPLANT
DRAPE EXTREMITY T 121X128X90 (DRAPE) ×2 IMPLANT
DRAPE U-SHAPE 47X51 STRL (DRAPES) ×2 IMPLANT
DRSG PAD ABDOMINAL 8X10 ST (GAUZE/BANDAGES/DRESSINGS) ×1 IMPLANT
DURAPREP 26ML APPLICATOR (WOUND CARE) ×2 IMPLANT
ELECT REM PT RETURN 9FT ADLT (ELECTROSURGICAL) ×2
ELECTRODE REM PT RTRN 9FT ADLT (ELECTROSURGICAL) ×1 IMPLANT
EVACUATOR 1/8 PVC DRAIN (DRAIN) ×2 IMPLANT
GAUZE XEROFORM 1X8 LF (GAUZE/BANDAGES/DRESSINGS) ×2 IMPLANT
GAUZE XEROFORM 5X9 LF (GAUZE/BANDAGES/DRESSINGS) ×1 IMPLANT
GLOVE BIO SURGEON STRL SZ7 (GLOVE) ×2 IMPLANT
GLOVE BIO SURGEON STRL SZ7.5 (GLOVE) ×3 IMPLANT
GLOVE BIOGEL PI IND STRL 6.5 (GLOVE) IMPLANT
GLOVE BIOGEL PI IND STRL 7.0 (GLOVE) ×1 IMPLANT
GLOVE BIOGEL PI IND STRL 8 (GLOVE) ×1 IMPLANT
GLOVE BIOGEL PI INDICATOR 6.5 (GLOVE) ×2
GLOVE BIOGEL PI INDICATOR 7.0 (GLOVE) ×1
GLOVE BIOGEL PI INDICATOR 8 (GLOVE) ×1
GLOVE SS BIOGEL STRL SZ 6.5 (GLOVE) IMPLANT
GLOVE SUPERSENSE BIOGEL SZ 6.5 (GLOVE) ×2
GOWN PREVENTION PLUS XLARGE (GOWN DISPOSABLE) ×2 IMPLANT
GOWN STRL NON-REIN LRG LVL3 (GOWN DISPOSABLE) ×4 IMPLANT
HANDPIECE INTERPULSE COAX TIP (DISPOSABLE) ×2
HOOD PEEL AWAY FACE SHEILD DIS (HOOD) ×5 IMPLANT
KIT BASIN OR (CUSTOM PROCEDURE TRAY) ×2 IMPLANT
KIT ROOM TURNOVER OR (KITS) ×2 IMPLANT
MANIFOLD NEPTUNE II (INSTRUMENTS) ×2 IMPLANT
NS IRRIG 1000ML POUR BTL (IV SOLUTION) ×2 IMPLANT
PACK TOTAL JOINT (CUSTOM PROCEDURE TRAY) ×2 IMPLANT
PAD ARMBOARD 7.5X6 YLW CONV (MISCELLANEOUS) ×3 IMPLANT
PADDING CAST COTTON 6X4 STRL (CAST SUPPLIES) ×2 IMPLANT
SET HNDPC FAN SPRY TIP SCT (DISPOSABLE) ×1 IMPLANT
SPONGE GAUZE 4X4 12PLY (GAUZE/BANDAGES/DRESSINGS) ×3 IMPLANT
STAPLER VISISTAT 35W (STAPLE) ×2 IMPLANT
SUCTION FRAZIER TIP 10 FR DISP (SUCTIONS) ×2 IMPLANT
SURGIFLO TRUKIT (HEMOSTASIS) IMPLANT
SUT VIC AB 0 CTX 36 (SUTURE) ×2
SUT VIC AB 0 CTX36XBRD ANTBCTR (SUTURE) ×1 IMPLANT
SUT VIC AB 1 CTX 36 (SUTURE) ×2
SUT VIC AB 1 CTX36XBRD ANBCTR (SUTURE) ×1 IMPLANT
SUT VIC AB 2-0 CT1 27 (SUTURE) ×4
SUT VIC AB 2-0 CT1 TAPERPNT 27 (SUTURE) ×1 IMPLANT
TOWEL OR 17X24 6PK STRL BLUE (TOWEL DISPOSABLE) ×2 IMPLANT
TOWEL OR 17X26 10 PK STRL BLUE (TOWEL DISPOSABLE) ×2 IMPLANT
TRAY FOLEY CATH 14FR (SET/KITS/TRAYS/PACK) ×1 IMPLANT
WATER STERILE IRR 1000ML POUR (IV SOLUTION) ×5 IMPLANT

## 2011-05-04 NOTE — Preoperative (Signed)
Beta Blockers   Reason not to administer Beta Blockers:Not Applicable 

## 2011-05-04 NOTE — Progress Notes (Signed)
Orthopedic Tech Progress Note Patient Details:  Shawn Gaines [birth year] [id number]  CPM Left Knee CPM Left Knee: On Left Knee Flexion (Degrees): 30  Left Knee Extension (Degrees): 0    [name] 05/04/2011, 2:27 PM

## 2011-05-04 NOTE — Anesthesia Postprocedure Evaluation (Signed)
  Anesthesia Post-op Note  Patient: Shawn Gaines  Procedure(s) Performed: Procedure(s) (LRB): TOTAL KNEE ARTHROPLASTY (Left)  Patient Location: PACU  Anesthesia Type: General and GA combined with regional for post-op pain  Level of Consciousness: awake, alert  and oriented  Airway and Oxygen Therapy: Patient Spontanous Breathing and Patient connected to nasal cannula oxygen  Post-op Pain: moderate  Post-op Assessment: Post-op Vital signs reviewed and Patient's Cardiovascular Status Stable  Post-op Vital Signs: stable  Complications: No apparent anesthesia complications

## 2011-05-04 NOTE — Plan of Care (Signed)
Problem: Consults Goal: Diagnosis- Total Joint Replacement Outcome: Completed/Met Date Met:  05/04/11 Primary Total Knee Left

## 2011-05-04 NOTE — Progress Notes (Signed)
ANTICOAGULATION CONSULT NOTE - Initial Consult  Pharmacy Consult for coumadin Indication: VTE prophylaxis  Allergies  Allergen Reactions  . Lactose Intolerance (Gi)     Patient Measurements: Height: 6' (182.9 cm) Weight: 180 lb (81.647 kg) IBW/kg (Calculated) : 77.6   Vital Signs: Temp: 97.6 F (36.4 C) (03/01 1400) Temp src: Oral (03/01 1043) BP: 156/73 mmHg (03/01 1043) Pulse Rate: 70  (03/01 1043)  Labs:  Basename 05/04/11 1014  HGB 14.5  HCT 41.0  PLT 243  APTT 29  LABPROT 13.6  INR 1.02  HEPARINUNFRC --  CREATININE 0.72  CKTOTAL --  CKMB --  TROPONINI --   Estimated Creatinine Clearance: 93 ml/min (by C-G formula based on Cr of 0.72).  Medical History: Past Medical History  Diagnosis Date  . Prostatic hypertrophy   . Cancer     skin cancer- R Buttocks-bx was postive - pt not sure what kind of cancer- for removeal in March  . Arthritis     Knee  . Rotator cuff disorder     BIL    Assessment: 71 yo male s/p L TKA to start coumadin. INR noted at 1.02 on 05/04/11.  Goal of Therapy:  INR 2-3   Plan:  -Coumadin 5mg  po today -Daily PT/INR -Will begin education process  Shawn Gaines 05/04/2011,3:10 PM

## 2011-05-04 NOTE — Anesthesia Preprocedure Evaluation (Addendum)
Anesthesia Evaluation  Patient identified by MRN, date of birth, ID band Patient awake    Reviewed: Allergy & Precautions, H&P , NPO status , Patient's Chart, lab work & pertinent test results  Airway Mallampati: II TM Distance: >3 FB Neck ROM: Full    Dental  (+) Teeth Intact, Partial Lower and Dental Advisory Given   Pulmonary  clear to auscultation        Cardiovascular Regular Normal    Neuro/Psych Anxiety  Neuromuscular disease    GI/Hepatic   Endo/Other    Renal/GU      Musculoskeletal   Abdominal   Peds  Hematology   Anesthesia Other Findings   Reproductive/Obstetrics                          Anesthesia Physical Anesthesia Plan  ASA: II  Anesthesia Plan: General   Post-op Pain Management:    Induction: Intravenous  Airway Management Planned: LMA  Additional Equipment:   Intra-op Plan:   Post-operative Plan:   Informed Consent: I have reviewed the patients History and Physical, chart, labs and discussed the procedure including the risks, benefits and alternatives for the proposed anesthesia with the patient or authorized representative who has indicated his/her understanding and acceptance.   Dental advisory given  Plan Discussed with:   Anesthesia Plan Comments: (Plan GA with LMA and FNB  Kipp Brood, MD)       Anesthesia Quick Evaluation

## 2011-05-04 NOTE — Op Note (Signed)
PATIENT ID:      Shawn Gaines  MRN:     [id number] DOB/AGE:    1940-09-14 / 71 y.o.       OPERATIVE REPORT    DATE OF PROCEDURE:  05/04/2011       PREOPERATIVE DIAGNOSIS:   OSTEOARTHRITIS LEFT KNEE      Estimated Body mass index is 24.41 kg/(m^2) as calculated from the following:   Height as of this encounter: 6\' 0" (1.829 m).   Weight as of this encounter: 180 lb(81.647 kg).                                                        POSTOPERATIVE DIAGNOSIS:   OA L Knee                                                                        PROCEDURE:  Procedure(s): TOTAL KNEE ARTHROPLASTY Using Depuy Sigma RP implants #6L Femur, #6Tibia, 10mm sigma RP bearing, 41 Patella     SURGEON: Brahim Dolman J    ASSISTANT:   Shirl Harris PA-C   (Present and scrubbed throughout the case, critical for assistance with exposure, retraction, instrumentation, and closure.)         ANESTHESIA: GA combined with regional for post-op pain   DRAINS: foley, 2 medium hemovac in knee   TOURNIQUET TIME:    COMPLICATIONS:  None     SPECIMENS: None   INDICATIONS FOR PROCEDURE: The patient has  OSTEOARTHRITIS LEFT KNEE, varus deformities, XR shows bone on bone arthritis. Patient has failed all conservative measures including anti-inflammatory medicines, narcotics, attempts at  exercise and weight loss, cortisone injections and viscosupplementation.  Risks and benefits of surgery have been discussed, questions answered.   DESCRIPTION OF PROCEDURE: The patient identified by armband, received  right femoral nerve block and IV antibiotics, in the holding area at [facility]. Patient taken to the operating room, appropriate anesthetic  monitors were attached General endotracheal anesthesia induced with  the patient in supine position, Foley catheter was inserted. Tourniquet  applied high to the operative thigh. Lateral post and foot positioner  applied to the table, the lower extremity was then prepped  and draped  in usual sterile fashion from the ankle to the tourniquet. Time-out procedure was performed. The limb was wrapped with an Esmarch bandage and the tourniquet inflated to 350 mmHg. We began the operation by making the anterior midline incision starting at  handbreadth above the patella going over the patella 1 cm medial to and  4 cm distal to the tibial tubercle. Small bleeders in the skin and the  subcutaneous tissue identified and cauterized. Transverse retinaculum was incised and reflected medially and a medial parapatellar arthrotomy was accomplished. the patella was everted and theprepatellar fat pad resected. The superficial medial collateral  ligament was then elevated from anterior to posterior along the proximal  flare of the tibia and anterior half of the menisci resected. The knee was hyperflexed exposing bone on bone arthritis. Peripheral and notch osteophytes as well as the cruciate ligaments were  then resected. We continued to  work our way around posteriorly along the proximal tibia, and externally  rotated the tibia subluxing it out from underneath the femur. A McHale  retractor was placed through the notch and a lateral Hohmann retractor  placed, and we then drilled through the proximal tibia in line with the  axis of the tibia followed by an intramedullary guide rod and 2-degree  posterior slope cutting guide. The tibial cutting guide was pinned into place  allowing resection of 3 mm of bone medially and about 9 mm of bone  laterally because of her varus deformity. Satisfied with the tibial resection, we then  entered the distal femur 2 mm anterior to the PCL origin with the  intramedullary guide rod and applied the distal femoral cutting guide  set at 11mm, with 5 degrees of valgus. This was pinned along the  epicondylar axis. At this point, the distal femoral cut was accomplished without difficulty. We then sized for a #6L femoral component and pinned the guide in 3  degrees of external rotation.The chamfer cutting guide was pinned into place. The anterior, posterior, and chamfer cuts were accomplished without difficulty followed by  the Sigma RP box cutting guide and the box cut. We also removed posterior osteophytes from the posterior femoral condyles. At this  time, the knee was brought into full extension. We checked our  extension and flexion gaps and found them symmetric at 10mm.  The patella thickness measured at 25 mm. We felt a 41 patella would  fit. We set the cutting guide at 15 and removed the posterior 9.5-10 mm  of the patella sized for 41 button and drilled the lollipop. The knee  was then once again hyperflexed exposing the proximal tibia. We sized for a #6 tibial base plate, applied the smokestack and the conical reamer followed by the the Delta fin keel punch. We then hammered into place the Sigma RP trial femoral component, inserted a 10-mm trial bearing, trial patellar button, and took the knee through range of motion from 0-130 degrees. No thumb pressure was required for patellar  tracking. At this point, all trial components were removed, a double batch of DePuy HV cement with 1500 mg of Zinacef was mixed and applied to all bony metallic mating surfaces except for the posterior condyles of the femur itself. In order, we  hammered into place the tibial tray and removed excess cement, the femoral component and removed excess cement, a 10-mm Sigma RP bearing  was inserted, and the knee brought to full extension with compression.  The patellar button was clamped into place, and excess cement  removed. While the cement cured the wound was irrigated out with normal saline solution pulse lavage, and medium Hemovac drains were placed from an anterolateral  approach. Ligament stability and patellar tracking were checked and found to be excellent. The parapatellar arthrotomy was closed with  running #1 Vicryl suture. The subcutaneous tissue with 0 and  2-0 undyed  Vicryl suture, and the skin with skin staples. A dressing of Xeroform,  4 x 4, dressing sponges, Webril, and Ace wrap applied. The patient  awakened, extubated, and taken to recovery room without difficulty.   Gor Vestal J 05/04/2011, 11:24 AM

## 2011-05-04 NOTE — Interval H&P Note (Signed)
History and Physical Interval Note:  05/04/2011 11:22 AM  Shawn Gaines  has presented today for surgery, with the diagnosis of OSTEOARTHRITIS LEFT KNEE  The various methods of treatment have been discussed with the patient and family. After consideration of risks, benefits and other options for treatment, the patient has consented to  Procedure(s) (LRB): TOTAL KNEE ARTHROPLASTY (Left) as a surgical intervention .  The patients' history has been reviewed, patient examined, no change in status, stable for surgery.  I have reviewed the patients' chart and labs.  Questions were answered to the patient's satisfaction.     Nestor Lewandowsky

## 2011-05-04 NOTE — Progress Notes (Signed)
Pt removed IV while moving in bed; pressure held to site; tip was intact; small hematoma on top of hand at IV site.  IV restarted in right arm with a #18 gauge.

## 2011-05-04 NOTE — Progress Notes (Signed)
Pt resting comfortably after IV versed; stating that pain is improved.

## 2011-05-04 NOTE — Progress Notes (Signed)
CPM 0-30 degrees begun on left knee

## 2011-05-04 NOTE — Anesthesia Procedure Notes (Signed)
Anesthesia Regional Block:  Femoral nerve block  Pre-Anesthetic Checklist: ,, timeout performed, Correct Patient, Correct Site, Correct Laterality, Correct Procedure, Correct Position, site marked, Risks and benefits discussed,  Surgical consent,  Pre-op evaluation,  At surgeon's request and post-op pain management  Laterality: Left  Prep: chloraprep       Needles:  Injection technique: Single-shot  Needle Type: Stimulator Needle - 80      Needle Gauge: 22 and 22 G    Additional Needles:  Procedures: ultrasound guided and nerve stimulator Femoral nerve block  Nerve Stimulator or Paresthesia:  Response: Quadriceps, 0.4 mA,   Additional Responses:   Narrative:  Start time: 05/04/2011 11:29 AM End time: 05/04/2011 11:40 AM  Additional Notes: 30 cc 0.5% Marcaine with 1:200 epi injected in 5 cc increments easily.  Femoral nerve block

## 2011-05-04 NOTE — Progress Notes (Signed)
Dr. Noreene Larsson by to evaluate patient; informed of IV removal and pt being irritable; ordered IV versed and additional dilaudid as needed to control symptoms.

## 2011-05-04 NOTE — Transfer of Care (Signed)
Immediate Anesthesia Transfer of Care Note  Patient: Shawn Gaines  Procedure(s) Performed: Procedure(s) (LRB): TOTAL KNEE ARTHROPLASTY (Left)  Patient Location: PACU  Anesthesia Type: General  Level of Consciousness: awake and alert   Airway & Oxygen Therapy: Patient Spontanous Breathing and Patient connected to nasal cannula oxygen  Post-op Assessment: Report given to PACU RN and Post -op Vital signs reviewed and stable  Post vital signs: Reviewed  Complications: No apparent anesthesia complications

## 2011-05-05 LAB — CBC
MCH: 32.2 pg (ref 26.0–34.0)
MCHC: 34.2 g/dL (ref 30.0–36.0)
MCV: 94.2 fL (ref 78.0–100.0)
Platelets: 198 10*3/uL (ref 150–400)

## 2011-05-05 LAB — BASIC METABOLIC PANEL
Calcium: 8.5 mg/dL (ref 8.4–10.5)
Creatinine, Ser: 0.7 mg/dL (ref 0.50–1.35)
GFR calc non Af Amer: >90 mL/min (ref >90)
Glucose, Bld: 119 mg/dL — ABNORMAL HIGH (ref 70–99)
Sodium: 131 mEq/L — ABNORMAL LOW (ref 135–145)

## 2011-05-05 LAB — PROTIME-INR
INR: 1.12 (ref 0.00–1.49)
Prothrombin Time: 14.6 seconds (ref 11.6–15.2)

## 2011-05-05 MED ORDER — HYDROMORPHONE HCL PF 1 MG/ML IJ SOLN
1.0000 mg | INTRAMUSCULAR | Status: DC | PRN
  Administered 2011-05-05: 2 mg via INTRAVENOUS
  Administered 2011-05-05 – 2011-05-06 (×2): 1 mg via INTRAVENOUS
  Administered 2011-05-06 (×2): 2 mg via INTRAVENOUS
  Filled 2011-05-05: qty 2
  Filled 2011-05-05: qty 1
  Filled 2011-05-05 (×2): qty 2
  Filled 2011-05-05 (×2): qty 1

## 2011-05-05 MED ORDER — HYDROMORPHONE BOLUS VIA INFUSION
1.0000 mg | INTRAVENOUS | Status: DC | PRN
  Filled 2011-05-05: qty 2

## 2011-05-05 MED ORDER — WARFARIN SODIUM 5 MG PO TABS
5.0000 mg | ORAL_TABLET | Freq: Once | ORAL | Status: AC
  Administered 2011-05-05: 5 mg via ORAL
  Filled 2011-05-05: qty 1

## 2011-05-05 MED ORDER — HYDROMORPHONE HCL PF 1 MG/ML IJ SOLN
INTRAMUSCULAR | Status: AC
  Administered 2011-05-05: 2 mg via INTRAVENOUS
  Filled 2011-05-05: qty 2

## 2011-05-05 NOTE — Evaluation (Signed)
Physical Therapy Evaluation Patient Details Name: Shawn Gaines MRN: [id number] DOB: 1940-12-26 Today's Date: 05/05/2011  Problem List:  Patient Active Problem List  Diagnoses  . TINEA CRURIS  . B12 DEFICIENCY  . UNSPECIFIED VITAMIN D DEFICIENCY  . ANXIETY  . ETHMOID SINUSITIS  . ACUTE SINUSITIS, UNSPECIFIED  . BENIGN PROSTATIC HYPERTROPHY  . HYDROCELE  . OSTEOARTHRITIS  . TOE PAIN  . WEAKNESS  . DYSURIA  . LACERATION, FOOT  . BENIGN PROSTATIC HYPERTROPHY, HX OF  . Osteoarthritis of left knee    Past Medical History:  Past Medical History  Diagnosis Date  . Prostatic hypertrophy   . Cancer     skin cancer- R Buttocks-bx was postive - pt not sure what kind of cancer- for removeal in March  . Arthritis     Knee  . Rotator cuff disorder     BIL   Past Surgical History:  Past Surgical History  Procedure Date  . Tonsillectomy   . Knee arthroplasty 2009    right  . Joint replacement 2009    R Knee    PT Assessment/Plan/Recommendation PT Assessment Clinical Impression Statement: Pt underwent L TKA 05-04-11. Eval was initiated this AM.  Lunch arrived and pt's desire was to eat lunch prior to completing mobility assessment.  Eval was therefore split into AM and PM sessions.  Pt is highly motivated and will progress well with PT intervention for safe return home with wife. PT Recommendation/Assessment: Patient will need skilled PT in the acute care venue PT Problem List: Decreased strength;Decreased range of motion;Decreased activity tolerance;Decreased mobility;Pain;Decreased knowledge of use of DME Barriers to Discharge: None PT Therapy Diagnosis : Difficulty walking;Acute pain PT Plan PT Frequency: 7X/week PT Treatment/Interventions: DME instruction;Gait training;Stair training;Functional mobility training;Therapeutic activities;Therapeutic exercise;Patient/family education PT Recommendation Recommendations for Other Services: OT consult Follow Up Recommendations: Home  health PT Equipment Recommended: None recommended by PT PT Goals  Acute Rehab PT Goals PT Goal Formulation: With patient Pt will go Supine/Side to Sit: with modified independence PT Goal: Supine/Side to Sit - Progress: Goal set today Pt will go Sit to Supine/Side: with modified independence PT Goal: Sit to Supine/Side - Progress: Goal set today Pt will go Sit to Stand: with modified independence PT Goal: Sit to Stand - Progress: Goal set today Pt will go Stand to Sit: with modified independence PT Goal: Stand to Sit - Progress: Goal set today Pt will Transfer Bed to Chair/Chair to Bed: with modified independence PT Transfer Goal: Bed to Chair/Chair to Bed - Progress: Goal set today Pt will Ambulate: >150 feet;with rolling walker;with modified independence PT Goal: Ambulate - Progress: Goal set today Pt will Go Up / Down Stairs: 3-5 stairs;with least restrictive assistive device;with supervision PT Goal: Up/Down Stairs - Progress: Goal set today Pt will Perform Home Exercise Program: with supervision, verbal cues required/provided PT Goal: Perform Home Exercise Program - Progress: Goal set today  PT Evaluation Precautions/Restrictions  Precautions Precautions: Knee Required Braces or Orthoses: No Restrictions LLE Weight Bearing: Weight bearing as tolerated Prior Functioning  Home Living Lives With: Spouse Receives Help From: Family Type of Home: House Home Layout: Two level;Full bath on main level;Able to live on main level with bedroom/bathroom Home Access: Level entry Home Adaptive Equipment: Walker - rolling Prior Function Level of Independence: Independent with basic ADLs;Independent with homemaking with ambulation;Independent with gait;Independent with transfers Driving: Yes Vocation: Part time employment Cognition Cognition Arousal/Alertness: Awake/alert Overall Cognitive Status: Appears within functional limits for tasks assessed Orientation Level:  Oriented  X4 Sensation/Coordination   Extremity Assessment   Mobility (including Balance) Bed Mobility Bed Mobility: Yes Supine to Sit: 4: Min assist Supine to Sit Details (indicate cue type and reason): verbal cues for sequencing Sit to Supine: 4: Min assist Transfers Transfers: Yes Sit to Stand: 4: Min assist;From bed;With upper extremity assist Sit to Stand Details (indicate cue type and reason): verbal cues for hand placement Stand to Sit: 4: Min assist;To chair/3-in-1;With armrests Stand to Sit Details: verbal cues for sequencing/safety Stand Pivot Transfers: 4: Min assist Stand Pivot Transfer Details (indicate cue type and reason): with RW, verbal cues for sequencing Ambulation/Gait Ambulation/Gait: Yes Ambulation/Gait Assistance: 4: Min assist Ambulation/Gait Assistance Details (indicate cue type and reason): verbal cues for sequencing Ambulation Distance (Feet): 25 Feet Assistive device: Rolling walker Gait Pattern: Step-to pattern;Decreased stance time - right;Antalgic Gait velocity: decreased    Exercise  Total Joint Exercises Ankle Circles/Pumps: AROM;Both;10 reps Quad Sets: AROM;Left;5 reps End of Session PT - End of Session Equipment Utilized During Treatment: Gait belt Activity Tolerance: Patient tolerated treatment well Patient left: in chair;in bed (in chair after AM session, in bed after PM session) General Behavior During Session: [facility] for tasks performed Cognition: [facility] for tasks performed  Ilda Foil 05/05/2011, 3:08 PM  Aida Raider, PT  Office # [phone] Pager [phone]

## 2011-05-05 NOTE — Progress Notes (Signed)
Hemovac output of 450 cc bloody drainage x11 hrs, PA updated, no new orders

## 2011-05-05 NOTE — Progress Notes (Signed)
ANTICOAGULATION CONSULT NOTE - follow up Pharmacy note  Consult for coumadin Indication: VTE prophylaxis  Allergies  Allergen Reactions  . Lactose Intolerance (Gi)     Patient Measurements: Height: 6' (182.9 cm) Weight: 180 lb (81.647 kg) IBW/kg (Calculated) : 77.6   Vital Signs: Temp: 97.7 F (36.5 C) (03/02 0631) BP: 107/54 mmHg (03/02 0631) Pulse Rate: 69  (03/02 0631)  Labs:  Basename 05/05/11 0500 05/04/11 1014  HGB 11.0* 14.5  HCT 32.2* 41.0  PLT 198 243  APTT -- 29  LABPROT 14.6 13.6  INR 1.12 1.02  HEPARINUNFRC -- --  CREATININE 0.70 0.72  CKTOTAL -- --  CKMB -- --  TROPONINI -- --   Estimated Creatinine Clearance: 93 ml/min (by C-G formula based on Cr of 0.7).  Medical History: Past Medical History  Diagnosis Date  . Prostatic hypertrophy   . Cancer     skin cancer- R Buttocks-bx was postive - pt not sure what kind of cancer- for removeal in March  . Arthritis     Knee  . Rotator cuff disorder     BIL    Assessment: 71 yo male s/p L TKA to start coumadin. INR noted at 1.12 on 3/213.  Goal of Therapy:  INR 2-3   Plan:  -Coumadin 5mg  po today -Daily PT/INR -Will begin education process  Lucille Passy 05/05/2011,11:51 AM

## 2011-05-05 NOTE — Progress Notes (Signed)
Subjective: 1 Day Post-Op Procedure(s) (LRB): TOTAL KNEE ARTHROPLASTY (Left) Patient reports pain as 8 on 0-10 scale.   Taking po/voiding ok  Objective: Vital signs in last 24 hours: Temp:  [97 F (36.1 C)-97.9 F (36.6 C)] 97.9 F (36.6 C) (03/02 1443) Pulse Rate:  [67-85] 85  (03/02 1443) Resp:  [9-18] 18  (03/02 1443) BP: (103-139)/(54-82) 128/72 mmHg (03/02 1443) SpO2:  [97 %-100 %] 98 % (03/02 1443)  Intake/Output from previous day: 03/01 0701 - 03/02 0700 In: 3051.3 [P.O.:170; I.V.:2531.3] Out: 2300 [Urine:1150; Drains:1000; Blood:150] Intake/Output this shift: Total I/O In: 240 [P.O.:240] Out: 600 [Urine:300; Drains:300]   Basename 05/05/11 0500 05/04/11 1014  HGB 11.0* 14.5    Basename 05/05/11 0500 05/04/11 1014  WBC 8.9 4.3  RBC 3.42* 4.40  HCT 32.2* 41.0  PLT 198 243    Basename 05/05/11 0500 05/04/11 1014  NA 131* 136  K 4.1 4.1  CL 99 103  CO2 24 24  BUN 11 13  CREATININE 0.70 0.72  GLUCOSE 119* 98  CALCIUM 8.5 9.3    Basename 05/05/11 0500 05/04/11 1014  LABPT -- --  INR 1.12 1.02   Left knee exam: Neurovascular intact Sensation intact distally Intact pulses distally Dorsiflexion/Plantar flexion intact Incision: dressing C/D/I Hemovac drain intact and patent. Assessment/Plan: 1 Day Post-Op Procedure(s) (LRB): TOTAL KNEE ARTHROPLASTY (Left) Up with therapy Cont coumadin Increase iv dilaudid dose to 2 mg prn pain. Will change dressing and pull drain in AM.   Shawn Gaines G 05/05/2011, 3:07 PM

## 2011-05-06 LAB — CBC
HCT: 28.1 % — ABNORMAL LOW (ref 39.0–52.0)
MCH: 33.2 pg (ref 26.0–34.0)
MCHC: 35.2 g/dL (ref 30.0–36.0)
RDW: 12.7 % (ref 11.5–15.5)

## 2011-05-06 LAB — PROTIME-INR: INR: 1.2 (ref 0.00–1.49)

## 2011-05-06 MED ORDER — WARFARIN SODIUM 5 MG PO TABS: ORAL_TABLET | ORAL | Status: DC

## 2011-05-06 MED ORDER — METHOCARBAMOL 750 MG PO TABS: ORAL_TABLET | ORAL | Status: AC

## 2011-05-06 MED ORDER — WARFARIN SODIUM 7.5 MG PO TABS
7.5000 mg | ORAL_TABLET | Freq: Once | ORAL | Status: AC
  Administered 2011-05-06: 7.5 mg via ORAL
  Filled 2011-05-06: qty 1

## 2011-05-06 MED ORDER — OXYCODONE-ACETAMINOPHEN 5-325 MG PO TABS: 1.0000 | ORAL_TABLET | ORAL | Status: AC | PRN

## 2011-05-06 NOTE — Progress Notes (Signed)
PT PROGRESS NOTE  Attempted PT Rx this PM.  Pt in 10/10 pain.  He just finished working with OT for evaluation.  OT reported pt very unsafe with transfer due to being upset and in significant pain.  RN aware, and reporting pt unable to have pain meds at this time.  MD approved pt for d/c home today.  PT feels pt is unable to d/c home today, and d/c should be held until tomorrow.  Pt unable to participate in PT session this PM due to the pain.  Attempting stair training when pt is angry re: pain would be unsafe.  PT to complete session, including stair training in AM, if pt remains as an inpt.  Pt/RN aware.  Aida Raider, PT  Office # [phone] Pager [phone]

## 2011-05-06 NOTE — Progress Notes (Signed)
CSW received consult for SNF. PT recommendation for home with [facility] noted. No other CSW needs reported or noted. CSW signing off. Please re-consult if SNF needed. Dellie Burns, MSW, [place] [phone] (weekend)

## 2011-05-06 NOTE — Discharge Summary (Deleted)
Patient ID: Shawn Gaines MRN: [id number] DOB/AGE: 72-Dec-1942 72 y.o.  Admit date: 05/04/2011 Discharge date: 05/06/2011  Admission Diagnoses:  Active Problems:  Osteoarthritis of left knee   Discharge Diagnoses:  Same  Past Medical History  Diagnosis Date  . Prostatic hypertrophy   . Cancer     skin cancer- R Buttocks-bx was postive - pt not sure what kind of cancer- for removeal in March  . Arthritis     Knee  . Rotator cuff disorder     BIL    Surgeries: Procedure(s):Left TOTAL KNEE ARTHROPLASTY on 05/04/2011  Discharged Condition: Improved  Hospital Course: Shawn Gaines is an 71 y.o. male who was admitted 05/04/2011 for operative treatment of osteoarthritis of the left knee. X-rays showed bone on bone DJD. Patient has severe unremitting pain that affects sleep, daily activities, and work/hobbies. After pre-op clearance the patient was taken to the operating room on 05/04/2011 and underwent  Procedure(s):Left TOTAL KNEE ARTHROPLASTY.    Patient was given perioperative antibiotics: Anti-infectives     Start     Dose/Rate Route Frequency Ordered Stop   05/04/11 1237   cefUROXime (ZINACEF) injection  Status:  Discontinued          As needed 05/04/11 1237 05/04/11 1354   05/04/11 1021   ceFAZolin (ANCEF) 2-3 GM-% IVPB SOLR     Comments: Elliott, Beth: cabinet override         05/04/11 1021 05/04/11 2229   05/03/11 1332   ceFAZolin (ANCEF) IVPB 2 g/50 mL premix        2 g 100 mL/hr over 30 Minutes Intravenous 60 min pre-op 05/03/11 1332 05/04/11 1155           Patient was given sequential compression devices, early ambulation, and chemoprophylaxis to prevent DVT.  Patient benefited maximally from hospital stay and there were no complications.    Recent vital signs: Patient Vitals for the past 24 hrs:  BP Temp Temp src Pulse Resp SpO2  05/06/11 0608 155/95 mmHg 98 F (36.7 C) - 78  18  99 %  May 17, 2011 2151 128/78 mmHg 98.6 F (37 C) - 96  18  99 %  17-May-2011 1748 112/62  mmHg - - 77  18  -  May 17, 2011 1443 128/72 mmHg 97.9 F (36.6 C) Oral 85  18  98 %     Recent laboratory studies:  Basename 05/06/11 0605 05-17-11 0500 05/04/11 1014  WBC 8.5 8.9 --  HGB 9.9* 11.0* --  HCT 28.1* 32.2* --  PLT 181 198 --  NA -- 131* 136  K -- 4.1 4.1  CL -- 99 103  CO2 -- 24 24  BUN -- 11 13  CREATININE -- 0.70 0.72  GLUCOSE -- 119* 98  INR 1.20 1.12 --  CALCIUM -- 8.5 --     Discharge Medications:   Medication List  As of 05/06/2011 11:17 AM   TAKE these medications         methocarbamol 750 MG tablet   Commonly known as: ROBAXIN   One tablet every 8 hrs prn spasm      oxyCODONE-acetaminophen 5-325 MG per tablet   Commonly known as: PERCOCET   Take 1-2 tablets by mouth every 4 (four) hours as needed for pain.      silodosin 8 MG Caps capsule   Commonly known as: RAPAFLO   Take 8 mg by mouth daily with breakfast.      warfarin 5 MG tablet   Commonly known  as: COUMADIN   One tablet daily x 2 weeks unless otherwise directed.            Diagnostic Studies: Dg Chest 2 View  05/04/2011  *RADIOLOGY REPORT*  Clinical Data: Preoperative evaluation for total knee arthroplasty. No current chest complaints  CHEST - 2 VIEW  Comparison: 10/23/2007  Findings: Changes of COPD are evident with a stable degree of hyperinflation noted.  Taking this into consideration, heart size is within normal limits.  Mild ectasia of the thoracic aorta is stable. The lung fields demonstrate some prominence of the interstitial markings compatible with underlying bronchitic change. No focal infiltrates or signs of congestive failure are noted.  No pleural fluid or significant peribronchial cuffing is seen. Scarring at the left costophrenic angle is unchanged.  Bony structures appear intact.  IMPRESSION: COPD with stable chronic changes.  No new focal or acute abnormality identified.  Original Report Authenticated By: Bertha Stakes, M.D.    Disposition: 01-Home or Self  Care  Discharge Orders    Future Orders Please Complete By Expires   Diet general      Call MD / Call 911      Comments:   If you experience chest pain or shortness of breath, CALL 911 and be transported to the hospital emergency room.  If you develope a fever above 101 F, pus (white drainage) or increased drainage or redness at the wound, or calf pain, call your surgeon's office.   Weight Bearing as taught in Physical Therapy      Comments:   Use a walker or crutches as instructed.   Driving restrictions      Comments:   No driving for 2-3 weeks   CPM      Comments:   Continuous passive motion machine (CPM):      Use the CPM from 0 to 60 degrees for 8 hours per day.      You may increase by 10 degrees per day.  You may break it up into 2 or 3 sessions per day.      Use CPM for 1-2 weeks or until you are told to stop.   Do not put a pillow under the knee. Place it under the heel.      Discharge instructions      Comments:   Keep your knee wound dry.      Follow-up Information    Follow up with Nestor Lewandowsky, MD. Schedule an appointment as soon as possible for a visit in 2 weeks.   Contact information:   [facility] [address] [place] [address] [phone]           Signed: Matthew Folks 05/06/2011, 11:17 AM

## 2011-05-06 NOTE — Progress Notes (Signed)
OT Cancellation Note  Treatment cancelled today due to pt finishing with PT session currently.  Pt asked if OT could return later today as he is experiencing significant pain.  Will re-attempt as time allows.    05/06/2011 Cipriano Mile OTR/L Pager [phone] Office [phone]

## 2011-05-06 NOTE — Progress Notes (Signed)
Subjective: 2 Days Post-Op Procedure(s) (LRB): TOTAL KNEE ARTHROPLASTY (Left) Patient reports pain as 5 on 0-10 scale.   Taking po/voiding OK  Objective: Vital signs in last 24 hours: Temp:  [97.9 F (36.6 C)-98.6 F (37 C)] 98 F (36.7 C) (03/03 4540) Pulse Rate:  [77-96] 78  (03/03 0608) Resp:  [18] 18  (03/03 0608) BP: (112-155)/(62-95) 155/95 mmHg (03/03 0608) SpO2:  [98 %-99 %] 99 % (03/03 9811)  Intake/Output from previous day: 03/02 0701 - 03/03 0700 In: 3475 [P.O.:600; I.V.:2875] Out: 3250 [Urine:2400; Drains:850] Intake/Output this shift:     Basename 05/06/11 0605 05/05/11 0500 05/04/11 1014  HGB 9.9* 11.0* 14.5    Basename 05/06/11 0605 05/05/11 0500  WBC 8.5 8.9  RBC 2.98* 3.42*  HCT 28.1* 32.2*  PLT 181 198    Basename 05/05/11 0500 05/04/11 1014  NA 131* 136  K 4.1 4.1  CL 99 103  CO2 24 24  BUN 11 13  CREATININE 0.70 0.72  GLUCOSE 119* 98  CALCIUM 8.5 9.3    Basename 05/06/11 0605 05/05/11 0500  LABPT -- --  INR 1.20 1.12   Left knee exam: ABD soft Neurovascular intact Sensation intact distally Intact pulses distally Dorsiflexion/Plantar flexion intact Incision: no drainage Compartment soft  Assessment/Plan: 2 Days Post-Op Procedure(s) (LRB): TOTAL KNEE ARTHROPLASTY (Left) PLAN: D/C IV fluids Discharge home with home health Dressing changed drain pulled.  Isobel Eisenhuth G 05/06/2011, 11:03 AM

## 2011-05-06 NOTE — Progress Notes (Signed)
Patient and spouse concerned about discharge home not being the best option at this time.  PT/OT have not cleared patient to discharge home.  Patient and spouse are requesting to SNF for Rehab.  Spouse explained that they have been separated and the patient is now living home alone.  MD notified.  Advised patient and spouse MD will come in the morning to discuss appropriate discharge.  Spouse is planning to be here around 8:30 AM to participate with discharge facilitation.  Also, patient is upset with a reported increase in pain.  MD aware.  Will continue with current pain regimen and current orders.  Will continue to monitor.

## 2011-05-06 NOTE — Progress Notes (Signed)
ANTICOAGULATION CONSULT NOTE - follow up Pharmacy note  Consult for coumadin Indication: VTE prophylaxis  Allergies  Allergen Reactions  . Lactose Intolerance (Gi)     Patient Measurements: Height: 6' (182.9 cm) Weight: 180 lb (81.647 kg) IBW/kg (Calculated) : 77.6   Vital Signs: Temp: 98 F (36.7 C) (03/03 0608) BP: 155/95 mmHg (03/03 0608) Pulse Rate: 78  (03/03 0608)  Labs:  Basename 05/06/11 0605 05/05/11 0500 05/04/11 1014  HGB 9.9* 11.0* --  HCT 28.1* 32.2* 41.0  PLT 181 198 243  APTT -- -- 29  LABPROT 15.5* 14.6 13.6  INR 1.20 1.12 1.02  HEPARINUNFRC -- -- --  CREATININE -- 0.70 0.72  CKTOTAL -- -- --  CKMB -- -- --  TROPONINI -- -- --   Estimated Creatinine Clearance: 93 ml/min (by C-G formula based on Cr of 0.7).  Medical History: Past Medical History  Diagnosis Date  . Prostatic hypertrophy   . Cancer     skin cancer- R Buttocks-bx was postive - pt not sure what kind of cancer- for removeal in March  . Arthritis     Knee  . Rotator cuff disorder     BIL    Assessment: 71 yo male s/p L TKA to start coumadin. INR noted at 1.20 on 05/06/11 remains subtherapeutic Goal of Therapy:  INR 2-3   Plan:  -Coumadin 7.5 5mg  po today -Daily PT/INR Pt educated  Lucille Passy 05/06/2011,12:18 PM

## 2011-05-06 NOTE — Progress Notes (Signed)
Occupational Therapy Evaluation Patient Details Name: Shawn Gaines MRN: [id number] DOB: 1941/02/16 Today's Date: 05/06/2011  Problem List:  Patient Active Problem List  Diagnoses  . TINEA CRURIS  . B12 DEFICIENCY  . UNSPECIFIED VITAMIN D DEFICIENCY  . ANXIETY  . ETHMOID SINUSITIS  . ACUTE SINUSITIS, UNSPECIFIED  . BENIGN PROSTATIC HYPERTROPHY  . HYDROCELE  . OSTEOARTHRITIS  . TOE PAIN  . WEAKNESS  . DYSURIA  . LACERATION, FOOT  . BENIGN PROSTATIC HYPERTROPHY, HX OF  . Osteoarthritis of left knee    Past Medical History:  Past Medical History  Diagnosis Date  . Prostatic hypertrophy   . Cancer     skin cancer- R Buttocks-bx was postive - pt not sure what kind of cancer- for removeal in March  . Arthritis     Knee  . Rotator cuff disorder     BIL   Past Surgical History:  Past Surgical History  Procedure Date  . Tonsillectomy   . Knee arthroplasty 2009    right  . Joint replacement 2009    R Knee    OT Assessment/Plan/Recommendation OT Assessment Clinical Impression Statement: Pt s/p L TKA thus affecting PLOF. Will benefit from acute OT to address below problem list in prep for d/c home.  OT Recommendation/Assessment: Patient will need skilled OT in the acute care venue OT Problem List: Decreased activity tolerance;Decreased knowledge of use of DME or AE;Pain OT Therapy Diagnosis : Acute pain OT Plan OT Frequency: Min 2X/week OT Treatment/Interventions: Self-care/ADL training;Therapeutic activities;Patient/family education;DME and/or AE instruction OT Recommendation Follow Up Recommendations: Supervision/Assistance - 24 hour Equipment Recommended: 3 in 1 bedside comode Individuals Consulted Consulted and Agree with Results and Recommendations: Patient OT Goals Acute Rehab OT Goals OT Goal Formulation: With patient Time For Goal Achievement: 7 days ADL Goals Pt Will Perform Grooming: with modified independence;Standing at sink ADL Goal: Grooming - Progress:  Goal set today Pt Will Perform Lower Body Bathing: with modified independence;Sit to stand from chair;Sit to stand from bed ADL Goal: Lower Body Bathing - Progress: Goal set today Pt Will Perform Lower Body Dressing: with modified independence;Sit to stand from chair;Sit to stand from bed ADL Goal: Lower Body Dressing - Progress: Goal set today Pt Will Transfer to Toilet: with modified independence;3-in-1;with DME;Ambulation ADL Goal: Toilet Transfer - Progress: Goal set today Pt Will Perform Tub/Shower Transfer: Tub transfer;Ambulation;with DME ADL Goal: Tub/Shower Transfer - Progress: Goal set today  OT Evaluation Precautions/Restrictions  Precautions Precautions: Knee Required Braces or Orthoses: No Restrictions Weight Bearing Restrictions: Yes LLE Weight Bearing: Weight bearing as tolerated Prior Functioning Home Living Lives With: Spouse Receives Help From: Family Type of Home: House Home Layout: Two level;Full bath on main level;Able to live on main level with bedroom/bathroom Bathroom Shower/Tub: Engineer, manufacturing systems: Standard Home Adaptive Equipment: Walker - rolling Prior Function Level of Independence: Independent with basic ADLs;Independent with homemaking with ambulation;Independent with gait;Independent with transfers Driving: Yes Vocation: Part time employment ADL ADL Lower Body Bathing: Performed;Moderate assistance Lower Body Bathing Details (indicate cue type and reason): Pt had soiled himself prior to OT arrival and required assist to reach bil. feet and for steadying when cleaning peri area in standing. Where Assessed - Lower Body Bathing: Sit to stand from bed Toilet Transfer: Simulated;Minimal assistance Toilet Transfer Details (indicate cue type and reason): bed to chair Toilet Transfer Method: Stand pivot Toilet Transfer Equipment: Other (comment) (chair) Equipment Used: Rolling walker ADL Comments: ADL assessement limited due to pt's pain  Vision/Perception    Cognition Cognition Arousal/Alertness: Awake/alert Overall Cognitive Status: Appears within functional limits for tasks assessed Orientation Level: Oriented X4 Sensation/Coordination   Extremity Assessment RUE Assessment RUE Assessment: Within Functional Limits LUE Assessment LUE Assessment: Within Functional Limits Mobility  Transfers Sit to Stand: 4: Min assist;From bed;With upper extremity assist Sit to Stand Details (indicate cue type and reason): cueing for hand placement Stand to Sit: 4: Min assist;To chair/3-in-1;With armrests Stand to Sit Details: cueing for sequencing Exercises   End of Session OT - End of Session Equipment Utilized During Treatment: Gait belt Activity Tolerance: Patient limited by pain;Other (comment) (agitation due to pain) Patient left: in chair;with call bell in reach Nurse Communication: Other (comment) (pain) General Behavior During Session: Agitated (upset about voiding in bed and not having pain meds) Cognition: [facility] for tasks performed   Cipriano Mile 05/06/2011, 4:30 PM  05/06/2011 Cipriano Mile OTR/L Pager [phone] Office [phone]

## 2011-05-06 NOTE — Progress Notes (Signed)
Physical Therapy Treatment Patient Details Name: Shawn Gaines MRN: [id number] DOB: 1940/12/23 Today's Date: 05/06/2011  PT Assessment/Plan  PT - Assessment/Plan Comments on Treatment Session: Pt reporting high levels of pain without relief from pain meds.  RN aware.  Pt's desire was to d/c home today.  PT recommends holding d/c until tomorrow.  Pt needs better pain control, increased mobility/gait distance, and stair training prior to d/c home. PT Plan: Discharge plan remains appropriate;Frequency remains appropriate PT Frequency: 7X/week Follow Up Recommendations: Home health PT Equipment Recommended: None recommended by PT PT Goals  Acute Rehab PT Goals PT Goal: Supine/Side to Sit - Progress: Progressing toward goal PT Goal: Sit to Supine/Side - Progress: Progressing toward goal PT Goal: Sit to Stand - Progress: Progressing toward goal PT Goal: Stand to Sit - Progress: Progressing toward goal PT Transfer Goal: Bed to Chair/Chair to Bed - Progress: Progressing toward goal PT Goal: Ambulate - Progress: Progressing toward goal PT Goal: Up/Down Stairs - Progress: Progressing toward goal PT Goal: Perform Home Exercise Program - Progress: Progressing toward goal  PT Treatment Precautions/Restrictions  Precautions Precautions: Knee Required Braces or Orthoses: No Restrictions Weight Bearing Restrictions: Yes LLE Weight Bearing: Weight bearing as tolerated Mobility (including Balance) Bed Mobility Supine to Sit: 4: Min assist;HOB elevated (Comment degrees) (45 degrees) Supine to Sit Details (indicate cue type and reason): verbal cues for sequencing Transfers Sit to Stand: 4: Min assist;From bed;With upper extremity assist Sit to Stand Details (indicate cue type and reason): verbal cues for hand placement, sequencing Stand to Sit: 4: Min assist;To chair/3-in-1;With armrests Stand to Sit Details: verbal cues for sequencing, safety Ambulation/Gait Ambulation/Gait Assistance: 4: Min  assist Ambulation/Gait Assistance Details (indicate cue type and reason): verbal cues for sequencing Ambulation Distance (Feet): 30 Feet Assistive device: Rolling walker Gait Pattern: Step-to pattern;Decreased stance time - left;Antalgic Gait velocity: decreased    Exercise  Total Joint Exercises Ankle Circles/Pumps: AROM;Both;10 reps Quad Sets: AAROM;Left;10 reps (required neuromuscular facilitation) Heel Slides: AAROM;Left;10 reps Hip ABduction/ADduction: AAROM;Left;5 reps End of Session PT - End of Session Equipment Utilized During Treatment: Gait belt Activity Tolerance: Patient limited by pain Patient left: in chair;with call bell in reach Nurse Communication: Mobility status for transfers General Behavior During Session: [facility] for tasks performed Cognition: [facility] for tasks performed  Ilda Foil 05/06/2011, 12:01 PM  Aida Raider, PT  Office # [phone] Pager [phone]

## 2011-05-07 LAB — PROTIME-INR: INR: 1.67 — ABNORMAL HIGH (ref 0.00–1.49)

## 2011-05-07 LAB — CBC
HCT: 26.8 % — ABNORMAL LOW (ref 39.0–52.0)
Hemoglobin: 9.3 g/dL — ABNORMAL LOW (ref 13.0–17.0)
MCH: 33.1 pg (ref 26.0–34.0)
MCHC: 34.7 g/dL (ref 30.0–36.0)
MCV: 95.4 fL (ref 78.0–100.0)
RDW: 12.6 % (ref 11.5–15.5)

## 2011-05-07 MED ORDER — WARFARIN SODIUM 5 MG PO TABS
5.0000 mg | ORAL_TABLET | Freq: Once | ORAL | Status: DC
  Filled 2011-05-07: qty 1

## 2011-05-07 MED ORDER — WARFARIN - PHARMACIST DOSING INPATIENT
Freq: Every day | Status: DC
  Filled 2011-05-07: qty 1

## 2011-05-07 MED ORDER — CELECOXIB 200 MG PO CAPS
200.0000 mg | ORAL_CAPSULE | Freq: Two times a day (BID) | ORAL | Status: DC
  Filled 2011-05-07 (×2): qty 1

## 2011-05-07 MED ORDER — CELECOXIB 200 MG PO CAPS: 200.0000 mg | ORAL_CAPSULE | Freq: Two times a day (BID) | ORAL | Status: AC

## 2011-05-07 MED FILL — Mupirocin Oint 2%: CUTANEOUS | Qty: 22 | Status: AC

## 2011-05-07 NOTE — Progress Notes (Signed)
Physical Therapy Treatment Note   05/07/11 0900  PT Visit Information  Last PT Received On 05/07/11  Precautions  Precautions Knee  Restrictions  LLE Weight Bearing WBAT  Bed Mobility  Supine to Sit 4: Min assist  Supine to Sit Details (indicate cue type and reason) assist for L LE management  Transfers  Sit to Stand 4: Min assist (contact guard)  Sit to Stand Details (indicate cue type and reason) cueing for sequencing/technique  Stand to Sit 4: Min assist (contact guard)  Stand to Sit Details verbal cues for hand placement  Ambulation/Gait  Ambulation/Gait Assistance 4: Min assist (contact guard)  Ambulation/Gait Assistance Details (indicate cue type and reason) verbal cues to attempt to achieve L foot flat, however patient ambulated on ball of L LE, pt with limited ability to WB thru L LE.  Ambulation Distance (Feet) 12 Feet (x2 (to/from bathroom))  Assistive device Rolling walker  Gait Pattern Step-to pattern;Decreased step length - left;Decreased stance time - left  Gait velocity decreased, freq standing rest breaks  Stairs No  Wheelchair Mobility  Wheelchair Mobility No  Posture/Postural Control  Posture/Postural Control No significant limitations  Balance  Balance Assessed No  Total Joint Exercises  Ankle Circles/Pumps AROM;Both;15 reps;Seated  Quad Sets AROM;Left;10 reps;Seated (with LEs elevated)  Heel Slides AAROM;Left;15 reps;Seated (achieved approx 45-50 of L knee flexion)  Knee Flexion AAROM;Left;10 reps;Seated (with LEs elevated)  PT - End of Session  Equipment Utilized During Treatment Gait belt  Activity Tolerance Patient limited by pain  Patient left in chair;with call bell in reach  Nurse Communication Mobility status for ambulation  General  Behavior During Session [facility] for tasks performed  Cognition [facility] for tasks performed  PT - Assessment/Plan  Comments on Treatment Session Patient with increased pain limiting ambulation tolerance this AM.  Patient con't to demonstrate decreased abiltiy to tolerated L LE in extension. Pt educated on importance of stretching behind the knee. Patient con't to have decreased active ROM and decreased L LE strength.. Patient to con't to benefit from SNF upon d/c for maximal functional recovery for safe transition home.  PT Plan Discharge plan remains appropriate;Frequency remains appropriate  PT Frequency 7X/week  Follow Up Recommendations Skilled nursing facility  Acute Rehab PT Goals  PT Goal: Supine/Side to Sit - Progress Progressing toward goal  PT Goal: Sit to Supine/Side - Progress Progressing toward goal  PT Goal: Sit to Stand - Progress Progressing toward goal  PT Goal: Stand to Sit - Progress Progressing toward goal  PT Transfer Goal: Bed to Chair/Chair to Bed - Progress Progressing toward goal  PT Goal: Ambulate - Progress Progressing toward goal  PT Goal: Up/Down Stairs - Progress Progressing toward goal  PT Goal: Perform Home Exercise Program - Progress Progressing toward goal    Pain: 8/10 L knee pain   Lewis Shock, PT, DPT Pager #: [phone] Office #: [phone]

## 2011-05-07 NOTE — Progress Notes (Signed)
Clinical Social Work-CSW facilitated pt d/c to [facility] with wife providing transport and d/c packet-No further needs-Cross Jorge-MSW, [phone]

## 2011-05-07 NOTE — Discharge Summary (Signed)
Patient ID: Shawn Gaines MRN: [id number] DOB/AGE: [birth year] 71 y.o.  Admit date: 05/04/2011 Discharge date: 05/07/2011  Admission Diagnoses:  Active Problems:  Osteoarthritis of left knee   Discharge Diagnoses:  Same  Past Medical History  Diagnosis Date  . Prostatic hypertrophy   . Cancer     skin cancer- R Buttocks-bx was postive - pt not sure what kind of cancer- for removeal in March  . Arthritis     Knee  . Rotator cuff disorder     BIL    Surgeries: Procedure(s): TOTAL KNEE ARTHROPLASTY on 05/04/2011   Consultants:    Discharged Condition: Improved  Hospital Course: Shawn Gaines is an 71 y.o. male who was admitted 05/04/2011 for operative treatment of<principal problem not specified>. Patient has severe unremitting pain that affects sleep, daily activities, and work/hobbies. After pre-op clearance the patient was taken to the operating room on 05/04/2011 and underwent  Procedure(s): TOTAL KNEE ARTHROPLASTY.    Patient was given perioperative antibiotics: Anti-infectives     Start     Dose/Rate Route Frequency Ordered Stop   05/04/11 1237   cefUROXime (ZINACEF) injection  Status:  Discontinued          As needed 05/04/11 1237 05/04/11 1354   05/04/11 1021   ceFAZolin (ANCEF) 2-3 GM-% IVPB SOLR     Comments: Elliott, Beth: cabinet override         05/04/11 1021 05/04/11 2229   05/03/11 1332   ceFAZolin (ANCEF) IVPB 2 g/50 mL premix        2 g 100 mL/hr over 30 Minutes Intravenous 60 min pre-op 05/03/11 1332 05/04/11 1155           Patient was given sequential compression devices, early ambulation, and chemoprophylaxis to prevent DVT.  Patient benefited maximally from hospital stay and there were no complications.    Recent vital signs: Patient Vitals for the past 24 hrs:  BP Temp Temp src Pulse Resp SpO2  05/07/11 0621 112/70 mmHg 98.6 F (37 C) - 84  18  98 %  May 14, 2011 2135 113/69 mmHg 99.6 F (37.6 C) - 83  18  97 %  May 14, 2011 1500 150/85 mmHg 98.3 F (36.8  C) Oral 85  18  98 %     Recent laboratory studies:  Basename 05/07/11 0550 05/14/11 0605 05/05/11 0500  WBC 6.3 8.5 --  HGB 9.3* 9.9* --  HCT 26.8* 28.1* --  PLT 152 181 --  NA -- -- 131*  K -- -- 4.1  CL -- -- 99  CO2 -- -- 24  BUN -- -- 11  CREATININE -- -- 0.70  GLUCOSE -- -- 119*  INR 1.67* 1.20 --  CALCIUM -- -- 8.5     Discharge Medications:   Medication List  As of 05/07/2011 12:29 PM   TAKE these medications         methocarbamol 750 MG tablet   Commonly known as: ROBAXIN   One tablet every 8 hrs prn spasm      oxyCODONE-acetaminophen 5-325 MG per tablet   Commonly known as: PERCOCET   Take 1-2 tablets by mouth every 4 (four) hours as needed for pain.      silodosin 8 MG Caps capsule   Commonly known as: RAPAFLO   Take 8 mg by mouth daily with breakfast.      warfarin 5 MG tablet   Commonly known as: COUMADIN   One tablet daily x 2 weeks unless otherwise directed.  Diagnostic Studies: Dg Chest 2 View  05/04/2011  *RADIOLOGY REPORT*  Clinical Data: Preoperative evaluation for total knee arthroplasty. No current chest complaints  CHEST - 2 VIEW  Comparison: 10/23/2007  Findings: Changes of COPD are evident with a stable degree of hyperinflation noted.  Taking this into consideration, heart size is within normal limits.  Mild ectasia of the thoracic aorta is stable. The lung fields demonstrate some prominence of the interstitial markings compatible with underlying bronchitic change. No focal infiltrates or signs of congestive failure are noted.  No pleural fluid or significant peribronchial cuffing is seen. Scarring at the left costophrenic angle is unchanged.  Bony structures appear intact.  IMPRESSION: COPD with stable chronic changes.  No new focal or acute abnormality identified.  Original Report Authenticated By: Bertha Stakes, M.D.    Disposition: 01-Home or Self Care  Discharge Orders    Future Orders Please Complete By Expires   Diet  general      Diet - low sodium heart healthy      Call MD / Call 911      Comments:   If you experience chest pain or shortness of breath, CALL 911 and be transported to the hospital emergency room.  If you develope a fever above 101 F, pus (white drainage) or increased drainage or redness at the wound, or calf pain, call your surgeon's office.   Weight Bearing as taught in Physical Therapy      Comments:   Use a walker or crutches as instructed.   Driving restrictions      Comments:   No driving for 2-3 weeks   CPM      Comments:   Continuous passive motion machine (CPM):      Use the CPM from 0 to 60 degrees for 8 hours per day.      You may increase by 10 degrees per day.  You may break it up into 2 or 3 sessions per day.      Use CPM for 1-2 weeks or until you are told to stop.   Do not put a pillow under the knee. Place it under the heel.      Discharge instructions      Comments:   Keep your knee wound dry.   Increase activity slowly      Walker       May shower / Bathe      Driving Restrictions      Comments:   No driving for 2 weeks.   Change dressing (specify)      Comments:   Dressing change as needed.   Call MD for:  temperature >100.4      Call MD for:  severe uncontrolled pain      Call MD for:  redness, tenderness, or signs of infection (pain, swelling, redness, odor or green/yellow discharge around incision site)         Follow-up Information    Follow up with Nestor Lewandowsky, MD. Schedule an appointment as soon as possible for a visit in 2 weeks.   Contact information:   [facility] [address] [place] [address] [phone]           Signed: Hazle Nordmann. 05/07/2011, 12:29 PM

## 2011-05-07 NOTE — Progress Notes (Signed)
Physical Therapy Treatment Note   05/07/11 1220  PT Visit Information  Last PT Received On 05/07/11  Precautions  Precautions Knee  Restrictions  LLE Weight Bearing WBAT  Bed Mobility  Sit to Supine 4: Min assist (with trapeze)  Sit to Supine - Details (indicate cue type and reason) assist for L LE management  Transfers  Sit to Stand 4: Min assist  Sit to Stand Details (indicate cue type and reason) verbal cues for safety  Stand to Sit 4: Min assist  Stand to Sit Details verbal cues for hand placement  Ambulation/Gait  Ambulation/Gait Assistance 4: Min assist (contact guard)  Ambulation/Gait Assistance Details (indicate cue type and reason) verbal cues to increased L LE WBing, increased ability to achieve L foot flat  Ambulation Distance (Feet) 100 Feet  Assistive device Rolling walker  Gait Pattern Step-to pattern;Decreased step length - left;Decreased stance time - left  Gait velocity freq standing rest breaks  Stairs No  Wheelchair Mobility  Wheelchair Mobility No  PT - End of Session  Equipment Utilized During Treatment Gait belt  Activity Tolerance Patient tolerated treatment well  Patient left in bed;in CPM;with call bell in reach (CPM 0-50 degrees)  Nurse Communication Mobility status for transfers  General  Behavior During Session [facility] for tasks performed  Cognition [facility] for tasks performed  PT - Assessment/Plan  Comments on Treatment Session Patient with increased ambulation tolerance this session but con't to have decreased active L Knee ROM and L LE strength. Patient with improved L LE weigt-bearing but remains to tolerate only 50 degrees of knee flexion in CPM.  PT Plan Discharge plan remains appropriate;Frequency remains appropriate  Follow Up Recommendations Skilled nursing facility  Acute Rehab PT Goals  PT Goal: Supine/Side to Sit - Progress Progressing toward goal  PT Goal: Sit to Supine/Side - Progress Progressing toward goal  PT Goal: Sit to Stand -  Progress Progressing toward goal  PT Goal: Stand to Sit - Progress Progressing toward goal  PT Transfer Goal: Bed to Chair/Chair to Bed - Progress Progressing toward goal  PT Goal: Ambulate - Progress Progressing toward goal  PT Goal: Up/Down Stairs - Progress Progressing toward goal  PT Goal: Perform Home Exercise Program - Progress Progressing toward goal     Pain: 6/10 L knee, RN provided patient with pain medication  Lewis Shock, PT, DPT Pager #: [phone] Office #: [phone]

## 2011-05-07 NOTE — Progress Notes (Signed)
PATIENT ID: Shawn Gaines  MRN: [id number]  DOB/AGE:  08-13-1940 / 71 y.o.  3 Days Post-Op Procedure(s) (LRB): TOTAL KNEE ARTHROPLASTY (Left)    PROGRESS NOTE Subjective: Patient is alert, oriented, no Nausea, no Vomiting, yes} passing gas, no Bowel Movement. Taking PO well. Denies SOB, Chest or Calf Pain. Using Incentive Spirometer, PAS in place. Ambulating well with PT. Patient reports pain as moderate  .    Objective: Vital signs in last 24 hours: Filed Vitals:   05/06/11 0608 05/06/11 1500 05/06/11 2135 05/07/11 0621  BP: 155/95 150/85 113/69 112/70  Pulse: 78 85 83 84  Temp: 98 F (36.7 C) 98.3 F (36.8 C) 99.6 F (37.6 C) 98.6 F (37 C)  TempSrc:  Oral    Resp: 18 18 18 18   Height:      Weight:      SpO2: 99% 98% 97% 98%      Intake/Output from previous day: I/O last 3 completed shifts: In: 2095 [P.O.:720; I.V.:1375] Out: 2250 [Urine:1700; Drains:550]   Intake/Output this shift:     LABORATORY DATA:  Basename 05/07/11 0550 05/06/11 0605 05/05/11 0500  WBC 6.3 8.5 --  HGB 9.3* 9.9* --  HCT 26.8* 28.1* --  PLT 152 181 --  NA -- -- 131*  K -- -- 4.1  CL -- -- 99  CO2 -- -- 24  BUN -- -- 11  CREATININE -- -- 0.70  GLUCOSE -- -- 119*  GLUCAP -- -- --  INR 1.67* 1.20 --  CALCIUM -- -- 8.5    Examination: Neurologically intact ABD soft Neurovascular intact Sensation intact distally Intact pulses distally Dorsiflexion/Plantar flexion intact Incision: dressing C/D/I}  Assessment:   3 Days Post-Op Procedure(s) (LRB): TOTAL KNEE ARTHROPLASTY (Left) ADDITIONAL DIAGNOSIS:  none  Plan: PT/OT WBAT, CPM 5/hrs day until ROM 0-90 degrees, then D/C CPM DVT Prophylaxis:  Coumadin target INR 1.5-2.0 DISCHARGE PLAN: Skilled Nursing Facility/Rehab today if bed available.   DISCHARGE NEEDS: HHPT, HHRN, CPM, Walker and 3-in-1 comode seat     Siomara Burkel M. 05/07/2011, 12:24 PM

## 2011-05-07 NOTE — Progress Notes (Signed)
Occupational Therapy Treatment Patient Details Name: Shawn Gaines MRN: [id number] DOB: 1940/08/05 Today's Date: 05/07/2011  OT Assessment/Plan OT Assessment/Plan Comments on Treatment Session: Pt. educated on use of AE for LB dressing technique and verbally described shower transfer  OT Plan: Discharge plan remains appropriate OT Frequency: Min 2X/week Follow Up Recommendations: Skilled nursing facility;Other (comment) (pt. and pt's wife desire ST-SNF stay) Equipment Recommended: Defer to next venue OT Goals Acute Rehab OT Goals OT Goal Formulation: With patient Time For Goal Achievement: 7 days ADL Goals Pt Will Transfer to Toilet: with modified independence;3-in-1;with DME;Ambulation ADL Goal: Toilet Transfer - Progress: Progressing toward goals  OT Treatment Precautions/Restrictions  Precautions Precautions: Knee Required Braces or Orthoses: No Restrictions Weight Bearing Restrictions: Yes LLE Weight Bearing: Weight bearing as tolerated   ADL ADL Toilet Transfer: Performed;Minimal assistance Toilet Transfer Details (indicate cue type and reason): Mod verbal cues for hand placement and technique for using RW in standing over commode Toilet Transfer Method: Ambulating Toilet Transfer Equipment: Other (comment) (Use of RW over the commode) Toileting - Clothing Manipulation: Performed;Minimal assistance Toileting - Clothing Manipulation Details (indicate cue type and reason): With moving gown Where Assessed - Toileting Clothing Manipulation: Standing Toileting - Hygiene: Performed;Minimal assistance Toileting - Hygiene Details (indicate cue type and reason): min assist for gathering toilet paper and holding gown up Where Assessed - Toileting Hygiene: Sit to stand from 3-in-1 or toilet Mobility  Bed Mobility Bed Mobility: Yes Supine to Sit: 4: Min assist;HOB elevated (Comment degrees) Supine to Sit Details (indicate cue type and reason): Mod verbal cues for  technique Transfers Sit to Stand: 4: Min assist;From bed;With upper extremity assist Sit to Stand Details (indicate cue type and reason): Min verbal cues for hand placement     End of Session OT - End of Session Equipment Utilized During Treatment: Gait belt Activity Tolerance: Other (comment);Patient limited by pain Patient left: in chair;with call bell in reach Nurse Communication: Other (comment) General Behavior During Session: [facility] for tasks performed Cognition: [facility] for tasks performed  Rathana Viveros, OTR/L Pager [phone]  05/07/2011, 10:21 AM

## 2011-05-07 NOTE — Progress Notes (Signed)
Pt bing D/Cd to SNF at this time. His wife is transporting pt to nursing facility.

## 2011-05-07 NOTE — Progress Notes (Signed)
ANTICOAGULATION CONSULT NOTE - follow up Pharmacy note  Consult for coumadin Indication: VTE prophylaxis  No Active Allergies  Patient Measurements: Height: 6' (182.9 cm) Weight: 180 lb (81.647 kg) IBW/kg (Calculated) : 77.6   Vital Signs: Temp: 98.6 F (37 C) (03/04 0621) BP: 112/70 mmHg (03/04 0621) Pulse Rate: 84  (03/04 0621)  Labs:  Basename 05/07/11 0550 05/06/11 0605 05/05/11 0500 05/04/11 1014  HGB 9.3* 9.9* -- --  HCT 26.8* 28.1* 32.2* --  PLT 152 181 198 --  APTT -- -- -- 29  LABPROT 20.0* 15.5* 14.6 --  INR 1.67* 1.20 1.12 --  HEPARINUNFRC -- -- -- --  CREATININE -- -- 0.70 0.72  CKTOTAL -- -- -- --  CKMB -- -- -- --  TROPONINI -- -- -- --   Estimated Creatinine Clearance: 93 ml/min (by C-G formula based on Cr of 0.7).  Medical History: Past Medical History  Diagnosis Date  . Prostatic hypertrophy   . Cancer     skin cancer- R Buttocks-bx was postive - pt not sure what kind of cancer- for removeal in March  . Arthritis     Knee  . Rotator cuff disorder     BIL    Assessment:  71 yo male s/p L TKA to start coumadin. INR up to 1.67. Hgb down to 9.3 post-op and platelets down to 152.  Goal of Therapy:  INR 2-3   Plan:  -Coumadin 5mg  po today -Daily PT/INR Pt educated 3/3  Merilynn Finland, [employer] 05/07/2011,9:38 AM

## 2011-05-08 ENCOUNTER — Encounter (HOSPITAL_COMMUNITY): Payer: Self-pay | Admitting: Orthopedic Surgery

## 2011-08-13 ENCOUNTER — Telehealth: Payer: Self-pay | Admitting: Family Medicine

## 2011-08-13 NOTE — Telephone Encounter (Signed)
Pt is req to get a prostate exam and to get psa check, and all other labs needed for urology. Pls advise.

## 2011-08-14 NOTE — Telephone Encounter (Signed)
Make him an OV. If he wants a cpx, give [place] 30 minutes.

## 2011-08-14 NOTE — Telephone Encounter (Signed)
Pt returned call and has rsc his cpx for June 24th with fasting labs on 08/16/11.

## 2011-08-14 NOTE — Telephone Encounter (Signed)
Called and left pt a vm to sch ov as noted and to remind pt of his upcoming cpx in July.

## 2011-08-16 ENCOUNTER — Telehealth: Payer: Self-pay | Admitting: Family Medicine

## 2011-08-16 ENCOUNTER — Other Ambulatory Visit: Payer: Medicare Other

## 2011-08-16 NOTE — Telephone Encounter (Signed)
al [employer] [employer] [address] [place], [place] [address] p. [phone] f. [phone] To: [facility] (After Hours Triage) Fax: [phone] From: [facility] Date/ Time: 08/15/2011 8:07 PM Taken By: Ulice Bold, CSR Caller: Jasson Facility: not collected Patient: Shawn Gaines, Shawn Gaines DOB: Oct 11, 1940 Phone: [phone] Reason for Call: See info below Regarding Appointment: Yes Appt Date: 08/16/2011 Appt Time: 11:30:00 AM Provider: Nelwyn Salisbury Reason: Cancel Appointment Details: Schedule conflict. Will call back to reschedule. Outcome: Instructed patient to call back on the next business day.

## 2011-08-17 ENCOUNTER — Other Ambulatory Visit (INDEPENDENT_AMBULATORY_CARE_PROVIDER_SITE_OTHER): Payer: Medicare Other

## 2011-08-17 DIAGNOSIS — Z79899 Other long term (current) drug therapy: Secondary | ICD-10-CM

## 2011-08-17 DIAGNOSIS — Z0000 Encounter for general adult medical examination without abnormal findings: Secondary | ICD-10-CM

## 2011-08-17 DIAGNOSIS — Z125 Encounter for screening for malignant neoplasm of prostate: Secondary | ICD-10-CM

## 2011-08-17 LAB — POCT URINALYSIS DIPSTICK
Leukocytes, UA: NEGATIVE
Protein, UA: NEGATIVE
Spec Grav, UA: 1.02
Urobilinogen, UA: 1

## 2011-08-17 LAB — BASIC METABOLIC PANEL
BUN: 13 mg/dL (ref 6–23)
Creatinine, Ser: 0.7 mg/dL (ref 0.4–1.5)
GFR: 117.76 mL/min (ref >60.00)
Potassium: 4.6 mEq/L (ref 3.5–5.1)

## 2011-08-17 LAB — HEPATIC FUNCTION PANEL
ALT: 18 U/L (ref 0–53)
AST: 20 U/L (ref 0–37)
Bilirubin, Direct: 0.1 mg/dL (ref 0.0–0.3)
Total Bilirubin: 0.9 mg/dL (ref 0.3–1.2)

## 2011-08-17 LAB — LIPID PANEL
LDL Cholesterol: 105 mg/dL — ABNORMAL HIGH (ref 0–99)
VLDL: 7 mg/dL (ref 0.0–40.0)

## 2011-08-17 LAB — CBC WITH DIFFERENTIAL/PLATELET
Eosinophils Relative: 4.9 % (ref 0.0–5.0)
Monocytes Relative: 10.8 % (ref 3.0–12.0)
Neutrophils Relative %: 56.3 % (ref 43.0–77.0)
Platelets: 230 10*3/uL (ref 150.0–400.0)
WBC: 4.3 10*3/uL — ABNORMAL LOW (ref 4.5–10.5)

## 2011-08-17 LAB — PSA: PSA: 0.26 ng/mL (ref 0.10–4.00)

## 2011-08-21 ENCOUNTER — Encounter: Payer: Self-pay | Admitting: Family Medicine

## 2011-08-21 NOTE — Progress Notes (Signed)
Quick Note:  I tried to reach pt by phone and no answer. I put a copy of results in mail. ______ 

## 2011-08-22 ENCOUNTER — Ambulatory Visit (INDEPENDENT_AMBULATORY_CARE_PROVIDER_SITE_OTHER): Payer: Medicare Other | Admitting: Family Medicine

## 2011-08-22 ENCOUNTER — Telehealth: Payer: Self-pay | Admitting: Family Medicine

## 2011-08-22 ENCOUNTER — Encounter: Payer: Self-pay | Admitting: Family Medicine

## 2011-08-22 VITALS — BP 120/78 | HR 69 | Temp 97.6°F | Wt 186.0 lb

## 2011-08-22 DIAGNOSIS — T148XXA Other injury of unspecified body region, initial encounter: Secondary | ICD-10-CM

## 2011-08-22 DIAGNOSIS — W57XXXA Bitten or stung by nonvenomous insect and other nonvenomous arthropods, initial encounter: Secondary | ICD-10-CM

## 2011-08-22 DIAGNOSIS — T148 Other injury of unspecified body region: Secondary | ICD-10-CM

## 2011-08-22 MED ORDER — SILODOSIN 8 MG PO CAPS: 8.0000 mg | ORAL_CAPSULE | Freq: Every day | ORAL | Status: DC

## 2011-08-22 MED ORDER — DOXYCYCLINE HYCLATE 100 MG PO CAPS: 100.0000 mg | ORAL_CAPSULE | Freq: Two times a day (BID) | ORAL | Status: AC

## 2011-08-22 NOTE — Progress Notes (Signed)
  Subjective:    Patient ID: Shawn Gaines, male    DOB: Jan 22, 1941, 71 y.o.   MRN: [id number]  HPI Here for a tick bite on the left upper eyelid. He discovered this yesterday and a neighbor pulled it out with tweezers. Today the lid itches and is sore. He denies any fever, aches, etc.    Review of Systems  Constitutional: Negative.   Eyes: Negative.   Respiratory: Negative.   Cardiovascular: Negative.   Neurological: Negative.        Objective:   Physical Exam  Constitutional: He appears well-developed and well-nourished.  Eyes: Conjunctivae and EOM are normal. Pupils are equal, round, and reactive to light.       The left upper eyelid has a tiny red bite mark surrounded by some redness and edema          Assessment & Plan:  Cover with Doxycycline. Use ice and Benadryl cream at the site. Recheck prn

## 2011-08-22 NOTE — Telephone Encounter (Signed)
[facility] Triage Call Report Triage Record Num: [id number] Operator: Patriciaann Clan Patient Name: Shawn Gaines Call Date & Time: 08/21/2011 7:59:54PM Patient Phone: [phone] PCP: Tera Mater. Clent Ridges Patient Gender: Male PCP Fax : [phone] Patient DOB: [birth year] [facility] Name[facility] Reason for Call: Caller: Raymond/Patient; PCP: Nelwyn Salisbury.; CB#: [phone]; Call regarding Tick on eyelid; Patient states he noted a Tick on his eyelid 08/21/11. States body of tick was removed but unable to remove the head of tick. No redness or swelling. Afebrile. Triage per Insect Bites Protocol. No emergent sx identified. Care advice given per guidelines. Call back parameters reviewed. Patient advised to call office 08/22/11 0830 fo rappt. Patient verbalizes understanding and agreeable. Protocol(s) Used: Bites and Stings - Insects or Spiders Recommended Outcome per Protocol: Provide Home/Self Care Reason for Outcome: Tick bite Care Advice: If you find a tick on yourself, it is important to take it off as soon as possible. The tick transmitting [facility] Spotted Fever can cause an infection after several hours of attachment. The risk of getting Lyme disease is much lower if the tick is removed within 36 hours. ~ After the tick is removed, wash hands with soap and water and disinfect the bite site with iodine scrub, rubbing alcohol, or soap and water. ~ If possible, save the tick for identification in case of illness. Place in a plastic bag, seal it, write the date and location of the bite on the bag, and store in the freezer. ~ ~ SYMPTOM / CONDITION MANAGEMENT To remove the tick: - Wear latex gloves, or protect fingers with a tissue or paper towel. Grasp the tick with fine-tipped tweezers as close to the skin as possible. With a steady, even motion, pull the tick up and away from the skin. If the head remains in the skin remove it with tweezers, or call provider if  unable to remove. - Mouth parts left in skin may cause irritation or a granuloma. - Do not try to smother the tick with petroleum jelly, nail polish, gasoline, rubbing alcohol or any other irritant. - Do not burn the tick with a hot match in an attempt to remove it. These methods may cause the tick to inject more fluids into the skin. ~

## 2011-08-27 ENCOUNTER — Ambulatory Visit (INDEPENDENT_AMBULATORY_CARE_PROVIDER_SITE_OTHER): Payer: Medicare Other | Admitting: Family Medicine

## 2011-08-27 ENCOUNTER — Telehealth: Payer: Self-pay | Admitting: Family Medicine

## 2011-08-27 ENCOUNTER — Encounter: Payer: Self-pay | Admitting: Family Medicine

## 2011-08-27 ENCOUNTER — Encounter: Payer: Medicare Other | Admitting: Family Medicine

## 2011-08-27 VITALS — BP 120/82 | HR 74 | Temp 97.7°F | Ht 69.5 in | Wt 184.0 lb

## 2011-08-27 DIAGNOSIS — Z0000 Encounter for general adult medical examination without abnormal findings: Secondary | ICD-10-CM

## 2011-08-27 NOTE — Progress Notes (Signed)
  Subjective:    Patient ID: Shawn Gaines, male    DOB: 02/16/1941, 71 y.o.   MRN: [id number]  HPI 71 yr old male for a cpx. He feels fine and has no concerns. We saw him last week for a tick bite to the left upper eyelid, and this is healing well.    Review of Systems  Constitutional: Negative.   HENT: Negative.   Eyes: Negative.   Respiratory: Negative.   Cardiovascular: Negative.   Gastrointestinal: Negative.   Genitourinary: Negative.   Musculoskeletal: Negative.   Skin: Negative.   Neurological: Negative.   Hematological: Negative.   Psychiatric/Behavioral: Negative.        Objective:   Physical Exam  Constitutional: He is oriented to person, place, and time. He appears well-developed and well-nourished. No distress.  HENT:  Head: Normocephalic and atraumatic.  Right Ear: External ear normal.  Left Ear: External ear normal.  Nose: Nose normal.  Mouth/Throat: Oropharynx is clear and moist. No oropharyngeal exudate.  Eyes: Conjunctivae and EOM are normal. Pupils are equal, round, and reactive to light. Right eye exhibits no discharge. Left eye exhibits no discharge. No scleral icterus.  Neck: Neck supple. No JVD present. No tracheal deviation present. No thyromegaly present.  Cardiovascular: Normal rate, regular rhythm, normal heart sounds and intact distal pulses.  Exam reveals no gallop and no friction rub.   No murmur heard.      EKG normal   Pulmonary/Chest: Effort normal and breath sounds normal. No respiratory distress. He has no wheezes. He has no rales. He exhibits no tenderness.  Abdominal: Soft. Bowel sounds are normal. He exhibits no distension and no mass. There is no tenderness. There is no rebound and no guarding.  Genitourinary: Rectum normal, prostate normal and penis normal. Guaiac negative stool. No penile tenderness.  Musculoskeletal: Normal range of motion. He exhibits no edema and no tenderness.  Lymphadenopathy:    He has no cervical adenopathy.    Neurological: He is alert and oriented to person, place, and time. He has normal reflexes. No cranial nerve deficit. He exhibits normal muscle tone. Coordination normal.  Skin: Skin is warm and dry. No rash noted. He is not diaphoretic. No erythema. No pallor.  Psychiatric: He has a normal mood and affect. His behavior is normal. Judgment and thought content normal.          Assessment & Plan:  Well exam. Set up a colonoscopy.

## 2011-08-27 NOTE — Telephone Encounter (Signed)
[facility] Triage Call Report Triage Record Num: [id number] Operator: April Finney Patient Name: Shawn Gaines Call Date & Time: 08/26/2011 5:57:00PM Patient Phone: [phone] PCP: Tera Mater. Clent Ridges Patient Gender: Male PCP Fax : [phone] Patient DOB: 15-Dec-1940 Practice Name: [facility] Reason for Call: Caller: Wenzel/Patient; PCP: Nelwyn Salisbury.; CB#: [phone]; Call regarding Tick bite eyelid, rx makes him dizzy and very tired; He is on Doxycyline since 08/23/11 for a tick bite. He states it's making him feel tired and having mild dizziness. No emergent symptoms. He denies any head ache, rash or fever. See in 24 hrs care advice given. Next dose is not due till tomorrow. Protocol(s) Used: Dizziness or Vertigo Recommended Outcome per Protocol: See Provider within 24 hours Reason for Outcome: Symptoms began after starting or changing dose of prescription, nonprescription, alternative medication, or illicit drug Care Advice: Call EMS 911 if patient develops confusion, decreased level of consciousness, chest pain, shortness of breath, or focal neurologic abnormalities such as facial droop or weakness of one extremity. ~ ~ DO NOT drive until condition evaluated. ~ Protect from falling or other injury. ~ Call provider immediately if have difficulty walking, vision problems, or weakness. ~ SYMPTOM / CONDITION MANAGEMENT ~ SAFETY / PROTECTION ~ CAUTIONS ~ List, or take, all current prescription(s), nonprescription or alternative medication(s) to provider for evaluation. ~ Lie still in a dimly lit room and avoid any sudden change in position. Medication Advice: - Discontinue all nonprescription and alternative medications, especially stimulants, until evaluated by provider. - Take prescribed medications as directed, following label instructions for the medication. - Do not change medications or dosing regimen until provider is consulted. - Know possible side effects of  medication and what to do if they occur. - Tell provider all prescription, nonprescription or alternative medications that you take ~ A temporary drop in blood pressure sometimes occurs with a quick change to an upright position (postural hypotension) and may cause light-headedness or dizziness. Change position slowly. Making a habit of rising slowly and sitting for a few minutes before standing to walk usually relieves the feeling of faintness. ~ When feeling faint, find a place to lie down if possible, and elevate legs 8 to 12 inches above the heart. If unable to lie down, sit in a chair and lower head between the knees for 3 to 5 minutes. If standing and not able to sit, cross legs and squeeze the knees together to move blood to the heart. ~

## 2011-08-28 ENCOUNTER — Ambulatory Visit (INDEPENDENT_AMBULATORY_CARE_PROVIDER_SITE_OTHER): Payer: Medicare Other | Admitting: Family Medicine

## 2011-08-28 ENCOUNTER — Encounter: Payer: Self-pay | Admitting: Family Medicine

## 2011-08-28 ENCOUNTER — Telehealth: Payer: Self-pay | Admitting: Gastroenterology

## 2011-08-28 VITALS — BP 120/82 | HR 73 | Temp 97.9°F | Wt 184.0 lb

## 2011-08-28 DIAGNOSIS — L570 Actinic keratosis: Secondary | ICD-10-CM

## 2011-08-28 DIAGNOSIS — H16139 Photokeratitis, unspecified eye: Secondary | ICD-10-CM

## 2011-08-28 NOTE — Progress Notes (Signed)
  Subjective:    Patient ID: Shawn Gaines, male    DOB: 1940-11-04, 71 y.o.   MRN: [id number]  HPI Here to treat a lesion on the forehead. We looked at this yesterday, and he returns today for cryosurgery. 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    Review of Systems  Constitutional: Negative.        Objective:   Physical Exam  Constitutional: He appears well-developed and well-nourished.  Skin:       Light tan papular lesion as above           Assessment & Plan:  The lesion was treated with 2 cycles of cryotherapy. He will cover the area with a Bandaid.

## 2011-08-28 NOTE — Telephone Encounter (Signed)
Informed pt I sent for his chart and will call him back; pt stated understanding.

## 2011-08-29 NOTE — Telephone Encounter (Signed)
lmom for pt to call back

## 2011-09-04 ENCOUNTER — Telehealth: Payer: Self-pay | Admitting: Family Medicine

## 2011-09-04 NOTE — Telephone Encounter (Signed)
I spoke with pt and gave him the information needed.

## 2011-09-04 NOTE — Telephone Encounter (Signed)
Pt called and said that he needs to know what type of vax is used for shingles vax. Pt needs this info for his insurance to see if they will cover. Pls call and leave a detailed message is pt is not avail.

## 2011-09-05 ENCOUNTER — Other Ambulatory Visit: Payer: Medicare Other

## 2011-09-10 ENCOUNTER — Ambulatory Visit (INDEPENDENT_AMBULATORY_CARE_PROVIDER_SITE_OTHER): Payer: Medicare Other | Admitting: Family Medicine

## 2011-09-10 DIAGNOSIS — Z0000 Encounter for general adult medical examination without abnormal findings: Secondary | ICD-10-CM

## 2011-09-10 DIAGNOSIS — Z2911 Encounter for prophylactic immunotherapy for respiratory syncytial virus (RSV): Secondary | ICD-10-CM

## 2011-09-12 ENCOUNTER — Encounter: Payer: Medicare Other | Admitting: Family Medicine

## 2011-09-13 NOTE — Telephone Encounter (Signed)
Discussed findings of COLON in 2007 with pt and that hyperplastic polyps were found. Dr Jarold Motto reviewed his chart last year and did not feel a repeat COLON was needed. Pt reports he is having no problems with bowel changes, stool changes or pain, but he would like for Dr Jarold Motto to review his chart again to verify a repeat COLON is not needed. Pt will be gone for 2 weeks and he stated it's OK to leave Dr Norval Gable decision on his home phone number. Dr Jarold Motto, the chart is on your desk for review. Thanks.

## 2011-09-14 NOTE — Telephone Encounter (Signed)
Informed pt Dr Jarold Motto feels as though he does not need a repeat COLON until 2017. Pt stated understanding and RECALL placed.

## 2011-09-14 NOTE — Telephone Encounter (Signed)
Due 2017

## 2011-09-17 ENCOUNTER — Telehealth: Payer: Self-pay | Admitting: Family Medicine

## 2011-09-17 NOTE — Telephone Encounter (Signed)
[facility] Triage Call Report Triage Record Num: [id number] Operator: Judeen Hammans Patient Name: Shawn Gaines Call Date & Time: 09/16/2011 9:04:41PM Patient Phone: [phone] PCP: Tera Mater. Clent Ridges Patient Gender: Male PCP Fax : [phone] Patient DOB: 01/19/1941 [facility] Name[facility] Reason for Call: Caller: [name]/Patient; PCP: Nelwyn Salisbury.; CB#: [phone]; Call regarding medication questions. Pt wants to know which medication (oxycodone, naproxen, doxycycline) would be best to take for pain relief for a 6 hour flight - gets stiff/uncomfortable after sitting for prolonged periods. No symptoms present at this time. Explained that Oxycodone is a narcotic and is the strongest of the meds in question, Doxycycline is an antibiotic and will not help with pain, and that Naproxen is like Ibuprofen but lasts a bit longer - can take that every 12 hours vs every 6 hours. Advised pt it would be more prudent to take Naproxen while in transit d/t potential sedative properties of Oxycodone. Pt also questioning whether it's ok to drink alcohol with medication - instructed pt never to consume alcohol while taking any medication. Protocol(s) Used: Information Only Call; No Symptom Triage (Adult) Recommended Outcome per Protocol: Provide Information or Advice Only Reason for Outcome: Health information question; no triage required. Information provided from approved references or clinical experience. Care Advice: ~ 07/

## 2012-01-16 ENCOUNTER — Encounter: Payer: Self-pay | Admitting: Family Medicine

## 2012-01-16 ENCOUNTER — Ambulatory Visit (INDEPENDENT_AMBULATORY_CARE_PROVIDER_SITE_OTHER): Payer: Medicare Other | Admitting: Family Medicine

## 2012-01-16 VITALS — BP 120/78 | HR 74 | Temp 97.9°F | Wt 189.0 lb

## 2012-01-16 DIAGNOSIS — N138 Other obstructive and reflux uropathy: Secondary | ICD-10-CM

## 2012-01-16 DIAGNOSIS — N139 Obstructive and reflux uropathy, unspecified: Secondary | ICD-10-CM

## 2012-01-16 DIAGNOSIS — K5900 Constipation, unspecified: Secondary | ICD-10-CM

## 2012-01-16 DIAGNOSIS — N401 Enlarged prostate with lower urinary tract symptoms: Secondary | ICD-10-CM

## 2012-01-16 MED ORDER — SILODOSIN 8 MG PO CAPS: 8.0000 mg | ORAL_CAPSULE | Freq: Every day | ORAL | Status: DC

## 2012-01-16 NOTE — Progress Notes (Signed)
  Subjective:    Patient ID: Shawn Gaines, male    DOB: 08/10/1940, 71 y.o.   MRN: [id number]  HPI Here for constipation for the past month. He averages a BM every 3 days, but only when he eats a lot of prunes. He does drink water but does not eat much fiber. About this same time a month ago he switched from Rapaflo to Flomax. No abdominal pain or nausea or fever. He says he has also been under a lot of stress lately with caring for his daughter, who is a single mother and who is unemployed and living with them.    Review of Systems  Constitutional: Negative.   Gastrointestinal: Positive for constipation. Negative for nausea, vomiting, abdominal pain, diarrhea, blood in stool and abdominal distention.       Objective:   Physical Exam  Constitutional: He appears well-developed and well-nourished.  Abdominal: Soft. Bowel sounds are normal. He exhibits no distension and no mass. There is no tenderness. There is no rebound and no guarding.          Assessment & Plan:  This is probably from a combination of stress and side effects of Flomax. He will stop this and go back on  Rapaflo. Try Miralax daily.

## 2012-01-17 ENCOUNTER — Telehealth: Payer: Self-pay | Admitting: Family Medicine

## 2012-01-17 NOTE — Telephone Encounter (Signed)
Pt called and said that he meant to get his prostate checked when he was if for ov. Would this require a or ov? Pls advise.

## 2012-01-18 NOTE — Telephone Encounter (Signed)
Called pt to sch ov for prostate check. Pt wanted to know if there could not be a charge for this ov, since this was supposed to be done during last ov? Pls advise.

## 2012-01-18 NOTE — Telephone Encounter (Signed)
I called and left a voice message with the below information. 

## 2012-01-18 NOTE — Telephone Encounter (Signed)
Just 15 minutes would work for this

## 2012-01-18 NOTE — Telephone Encounter (Signed)
First of all, he does not need a prostate exam because I already did one in June, which included a normal PSA. Second of all, he did not tell me the other day that he wanted a prostate exam or I would have done so. If he wants to come back in for this , yes there will be a charge for my time

## 2012-01-22 ENCOUNTER — Ambulatory Visit (INDEPENDENT_AMBULATORY_CARE_PROVIDER_SITE_OTHER): Payer: Medicare Other | Admitting: Family Medicine

## 2012-01-22 ENCOUNTER — Encounter: Payer: Self-pay | Admitting: Family Medicine

## 2012-01-22 VITALS — BP 124/76 | HR 69 | Temp 97.7°F | Wt 189.0 lb

## 2012-01-22 DIAGNOSIS — R059 Cough, unspecified: Secondary | ICD-10-CM

## 2012-01-22 DIAGNOSIS — K5900 Constipation, unspecified: Secondary | ICD-10-CM

## 2012-01-22 DIAGNOSIS — N401 Enlarged prostate with lower urinary tract symptoms: Secondary | ICD-10-CM

## 2012-01-22 DIAGNOSIS — N138 Other obstructive and reflux uropathy: Secondary | ICD-10-CM

## 2012-01-22 DIAGNOSIS — R05 Cough: Secondary | ICD-10-CM

## 2012-01-22 DIAGNOSIS — N139 Obstructive and reflux uropathy, unspecified: Secondary | ICD-10-CM

## 2012-01-22 MED ORDER — TAMSULOSIN HCL 0.4 MG PO CAPS: 0.8000 mg | ORAL_CAPSULE | Freq: Every day | ORAL | Status: DC

## 2012-01-22 NOTE — Progress Notes (Signed)
  Subjective:    Patient ID: Shawn Gaines, male    DOB: 19-Apr-1940, 71 y.o.   MRN: [id number]  HPI Here for several things. He asks about checking his prostate even though he has no symptoms. He had a normal DRE and PSA in June during his cpx. He asks to be switched back to Flomax because Rapaflo is too expensive. Also he asks about any products he can use for chest congestion that have eucalyptus oil in them. His friend gave him some liquid he had obtained with this in it, and it opened up his chest well. He denies any fever and does not feel sick as such. Also Miralax once a day has helped his BMs a little but they are still sluggish.    Review of Systems  Constitutional: Negative.   Respiratory: Positive for cough.   Gastrointestinal: Positive for constipation. Negative for nausea, vomiting, abdominal pain, diarrhea, blood in stool and abdominal distention.       Objective:   Physical Exam  Constitutional: He appears well-developed and well-nourished.  Cardiovascular: Normal rate, regular rhythm, normal heart sounds and intact distal pulses.   Pulmonary/Chest: Effort normal and breath sounds normal.  Lymphadenopathy:    He has no cervical adenopathy.          Assessment & Plan:  Increase the Miralax to bid. Try Vicks Vaporub on the chest for congestion. Go back on Flomax but increase to 0.8 mg a day.

## 2012-03-05 HISTORY — PX: COLONOSCOPY: SHX174

## 2012-03-10 ENCOUNTER — Encounter: Payer: Self-pay | Admitting: Family Medicine

## 2012-03-10 ENCOUNTER — Ambulatory Visit (INDEPENDENT_AMBULATORY_CARE_PROVIDER_SITE_OTHER): Payer: Medicare Other | Admitting: Family Medicine

## 2012-03-10 VITALS — BP 138/76 | HR 82 | Temp 97.9°F | Wt 188.0 lb

## 2012-03-10 DIAGNOSIS — Z9109 Other allergy status, other than to drugs and biological substances: Secondary | ICD-10-CM

## 2012-03-10 DIAGNOSIS — K5900 Constipation, unspecified: Secondary | ICD-10-CM

## 2012-03-10 DIAGNOSIS — N400 Enlarged prostate without lower urinary tract symptoms: Secondary | ICD-10-CM

## 2012-03-10 NOTE — Progress Notes (Signed)
  Subjective:    Patient ID: Shawn Gaines, male    DOB: 21-Oct-1940, 72 y.o.   MRN: [id number]  HPI Here to follow up on issues like BPH, constipation, and stuffy head and nose. He tried Miralax bid but this did not help, so he went back to eating prunes. These work well so he eats these every day. He went back on Flomax and is having good results, even on just 0.4 mg a day. The stuffy head has bothered him for a few weeks. He denies any ST or fever. He does not want to take any decongestants or meds for this.    Review of Systems  Constitutional: Negative.   HENT: Positive for congestion. Negative for hearing loss, ear pain, sore throat, sneezing and postnasal drip.   Eyes: Negative.   Respiratory: Negative.   Cardiovascular: Negative.   Gastrointestinal: Negative.   Genitourinary: Negative.        Objective:   Physical Exam  Constitutional: He appears well-developed and well-nourished.  HENT:  Right Ear: External ear normal.  Left Ear: External ear normal.  Nose: Nose normal.  Mouth/Throat: Oropharynx is clear and moist. No oropharyngeal exudate.  Eyes: Conjunctivae normal are normal. Pupils are equal, round, and reactive to light.  Neck: No thyromegaly present.  Pulmonary/Chest: Effort normal and breath sounds normal.  Lymphadenopathy:    He has no cervical adenopathy.          Assessment & Plan:  His constipation and BPH are stable. I advised him to run a humidifier in his bedroom every night and to use saline nasal sprays.

## 2012-04-02 ENCOUNTER — Telehealth: Payer: Self-pay | Admitting: Gastroenterology

## 2012-04-02 ENCOUNTER — Encounter: Payer: Self-pay | Admitting: Gastroenterology

## 2012-04-02 ENCOUNTER — Telehealth: Payer: Self-pay | Admitting: Family Medicine

## 2012-04-02 NOTE — Telephone Encounter (Signed)
Pt reports he hasn't had a BM in 4 days which is unusual for him. He's tried prune juice, stool softeners and this am he had an enema. Instructed him on Miralax use and that he can use twice today if needed; it's safe to use everyday. He will try this and if no improvements he will call tomorrow. He palns to keep his appt with Dr Jarold Motto on 04/11/12.

## 2012-04-02 NOTE — Telephone Encounter (Signed)
Opened in error

## 2012-04-07 ENCOUNTER — Encounter: Payer: Self-pay | Admitting: *Deleted

## 2012-04-09 ENCOUNTER — Telehealth: Payer: Self-pay | Admitting: Family Medicine

## 2012-04-09 ENCOUNTER — Encounter: Payer: Self-pay | Admitting: Family Medicine

## 2012-04-09 ENCOUNTER — Ambulatory Visit (INDEPENDENT_AMBULATORY_CARE_PROVIDER_SITE_OTHER): Payer: Medicare Other | Admitting: Family Medicine

## 2012-04-09 VITALS — BP 130/90 | HR 97 | Wt 184.0 lb

## 2012-04-09 DIAGNOSIS — L259 Unspecified contact dermatitis, unspecified cause: Secondary | ICD-10-CM

## 2012-04-09 DIAGNOSIS — J309 Allergic rhinitis, unspecified: Secondary | ICD-10-CM

## 2012-04-09 MED ORDER — METHYLPREDNISOLONE ACETATE 80 MG/ML IJ SUSP
120.0000 mg | Freq: Once | INTRAMUSCULAR | Status: AC
  Administered 2012-04-09: 120 mg via INTRAMUSCULAR

## 2012-04-09 MED ORDER — FLUTICASONE PROPIONATE 50 MCG/ACT NA SUSP: 2.0000 | Freq: Every day | NASAL | Status: DC

## 2012-04-09 NOTE — Addendum Note (Signed)
Addended by: Aniceto Boss A on: 04/09/2012 02:29 PM   Modules accepted: Orders

## 2012-04-09 NOTE — Telephone Encounter (Signed)
Patient states he is having allergic reaction to a hair product. Swelled lips, skin turning red, itching. Benadryl did not help. He is asking to be worked in today for a shot. He will call back in 1 hr (he had to go into a meeting). Please advise if I can work him in.  There are no avail appts today w/any provider.

## 2012-04-09 NOTE — Progress Notes (Signed)
  Subjective:    Patient ID: Shawn Gaines, male    DOB: 1941/01/23, 72 y.o.   MRN: [id number]  HPI Here for 2 things. First for one month he has had irritation and itching in both nostrils. Second last night he applied a hair coloring product to his beard, and this morning he has redness, burning, and swelling around the mouth and lips. No SOB.    Review of Systems  Constitutional: Negative.   HENT: Negative for nosebleeds, congestion and rhinorrhea.        Objective:   Physical Exam  Constitutional: He appears well-developed and well-nourished.  HENT:  Nose: Nose normal.  Mouth/Throat: Oropharynx is clear and moist.       Erythema around the entire mouth and the nasolabial folds. The lips are red and swollen  Eyes: Pupils are equal, round, and reactive to light.          Assessment & Plan:  Try Flonase sprays for the nasal inflammation. He has had an allergic reaction to the hair product, so he needs to never use this again. Given a steroid shot.

## 2012-04-09 NOTE — Telephone Encounter (Signed)
Ok to work in per Dr. Clent Ridges. Encounter closed.

## 2012-04-11 ENCOUNTER — Encounter: Payer: Self-pay | Admitting: Gastroenterology

## 2012-04-11 ENCOUNTER — Other Ambulatory Visit (INDEPENDENT_AMBULATORY_CARE_PROVIDER_SITE_OTHER): Payer: Medicare Other

## 2012-04-11 ENCOUNTER — Ambulatory Visit (INDEPENDENT_AMBULATORY_CARE_PROVIDER_SITE_OTHER): Payer: Medicare Other | Admitting: Gastroenterology

## 2012-04-11 ENCOUNTER — Telehealth: Payer: Self-pay | Admitting: Family Medicine

## 2012-04-11 VITALS — BP 146/90 | HR 146 | Ht 69.5 in | Wt 184.2 lb

## 2012-04-11 DIAGNOSIS — K5900 Constipation, unspecified: Secondary | ICD-10-CM

## 2012-04-11 DIAGNOSIS — Z87438 Personal history of other diseases of male genital organs: Secondary | ICD-10-CM

## 2012-04-11 DIAGNOSIS — K5909 Other constipation: Secondary | ICD-10-CM

## 2012-04-11 DIAGNOSIS — Z860100 Personal history of colon polyps, unspecified: Secondary | ICD-10-CM

## 2012-04-11 DIAGNOSIS — J449 Chronic obstructive pulmonary disease, unspecified: Secondary | ICD-10-CM

## 2012-04-11 DIAGNOSIS — M179 Osteoarthritis of knee, unspecified: Secondary | ICD-10-CM

## 2012-04-11 DIAGNOSIS — M1710 Unilateral primary osteoarthritis, unspecified knee: Secondary | ICD-10-CM

## 2012-04-11 DIAGNOSIS — Z87898 Personal history of other specified conditions: Secondary | ICD-10-CM

## 2012-04-11 DIAGNOSIS — IMO0002 Reserved for concepts with insufficient information to code with codable children: Secondary | ICD-10-CM

## 2012-04-11 LAB — COMPREHENSIVE METABOLIC PANEL
ALT: 19 U/L (ref 0–53)
Albumin: 4.3 g/dL (ref 3.5–5.2)
CO2: 27 mEq/L (ref 19–32)
Calcium: 9.1 mg/dL (ref 8.4–10.5)
Chloride: 102 mEq/L (ref 96–112)
GFR: 95.24 mL/min (ref >60.00)
Glucose, Bld: 95 mg/dL (ref 70–99)
Potassium: 4.5 mEq/L (ref 3.5–5.1)
Sodium: 134 mEq/L — ABNORMAL LOW (ref 135–145)
Total Protein: 7 g/dL (ref 6.0–8.3)

## 2012-04-11 LAB — HEPATIC FUNCTION PANEL
Albumin: 4.3 g/dL (ref 3.5–5.2)
Total Bilirubin: 1.2 mg/dL (ref 0.3–1.2)

## 2012-04-11 LAB — CBC WITH DIFFERENTIAL/PLATELET
Eosinophils Relative: 6 % — ABNORMAL HIGH (ref 0.0–5.0)
HCT: 42.5 % (ref 39.0–52.0)
Lymphocytes Relative: 21.8 % (ref 12.0–46.0)
Lymphs Abs: 1.2 10*3/uL (ref 0.7–4.0)
Monocytes Relative: 8.8 % (ref 3.0–12.0)
Platelets: 233 10*3/uL (ref 150.0–400.0)
WBC: 5.5 10*3/uL (ref 4.5–10.5)

## 2012-04-11 LAB — IBC PANEL
Iron: 165 ug/dL (ref 42–165)
Saturation Ratios: 33.6 % (ref 20.0–50.0)

## 2012-04-11 LAB — TSH: TSH: 1.7 u[IU]/mL (ref 0.35–5.50)

## 2012-04-11 LAB — T4: T4, Total: 6.4 ug/dL (ref 5.0–12.5)

## 2012-04-11 MED ORDER — MOVIPREP 100 G PO SOLR: 1.0000 | Freq: Once | ORAL | Status: DC

## 2012-04-11 NOTE — Patient Instructions (Signed)
You have been scheduled for a colonoscopy with propofol. Please follow written instructions given to you at your visit today.  Please pick up your prep kit at the pharmacy within the next 1-3 days. If you use inhalers (even only as needed) or a CPAP machine, please bring them with you on the day of your procedure.  Your physician has requested that you go to the basement for lab work before leaving today.  Please use Miralax twice a day.  Please purchase Metamucil over the counter. Take as directed.  Please follow high fiber diet below.  ______________________________________________________________________________________________________________________  High-Fiber Diet Fiber is found in fruits, vegetables, and grains. A high-fiber diet encourages the addition of more whole grains, legumes, fruits, and vegetables in your diet. The recommended amount of fiber for adult males is 38 g per day. For adult females, it is 25 g per day. Pregnant and lactating women should get 28 g of fiber per day. If you have a digestive or bowel problem, ask your caregiver for advice before adding high-fiber foods to your diet. Eat a variety of high-fiber foods instead of only a select few type of foods.  PURPOSE  To increase stool bulk.  To make bowel movements more regular to prevent constipation.  To lower cholesterol.  To prevent overeating. WHEN IS THIS DIET USED?  It may be used if you have constipation and hemorrhoids.  It may be used if you have uncomplicated diverticulosis (intestine condition) and irritable bowel syndrome.  It may be used if you need help with weight management.  It may be used if you want to add it to your diet as a protective measure against atherosclerosis, diabetes, and cancer. SOURCES OF FIBER  Whole-grain breads and cereals.  Fruits, such as apples, oranges, bananas, berries, prunes, and pears.  Vegetables, such as green peas, carrots, sweet potatoes, beets,  broccoli, cabbage, spinach, and artichokes.  Legumes, such split peas, soy, lentils.  Almonds. FIBER CONTENT IN FOODS Starches and Grains / Dietary Fiber (g)  Cheerios, 1 cup / 3 g  Corn Flakes cereal, 1 cup / 0.7 g  Rice crispy treat cereal, 1 cup / 0.3 g  Instant oatmeal (cooked),  cup / 2 g  Frosted wheat cereal, 1 cup / 5.1 g  Brown, long-grain rice (cooked), 1 cup / 3.5 g  White, long-grain rice (cooked), 1 cup / 0.6 g  Enriched macaroni (cooked), 1 cup / 2.5 g Legumes / Dietary Fiber (g)  Baked beans (canned, plain, or vegetarian),  cup / 5.2 g  Kidney beans (canned),  cup / 6.8 g  Pinto beans (cooked),  cup / 5.5 g Breads and Crackers / Dietary Fiber (g)  Plain or honey graham crackers, 2 squares / 0.7 g  Saltine crackers, 3 squares / 0.3 g  Plain, salted pretzels, 10 pieces / 1.8 g  Whole-wheat bread, 1 slice / 1.9 g  White bread, 1 slice / 0.7 g  Raisin bread, 1 slice / 1.2 g  Plain bagel, 3 oz / 2 g  Flour tortilla, 1 oz / 0.9 g  Corn tortilla, 1 small / 1.5 g  Hamburger or hotdog bun, 1 small / 0.9 g Fruits / Dietary Fiber (g)  Apple with skin, 1 medium / 4.4 g  Sweetened applesauce,  cup / 1.5 g  Banana,  medium / 1.5 g  Grapes, 10 grapes / 0.4 g  Orange, 1 small / 2.3 g  Raisin, 1.5 oz / 1.6 g  Melon, 1 cup /  1.4 g Vegetables / Dietary Fiber (g)  Green beans (canned),  cup / 1.3 g  Carrots (cooked),  cup / 2.3 g  Broccoli (cooked),  cup / 2.8 g  Peas (cooked),  cup / 4.4 g  Mashed potatoes,  cup / 1.6 g  Lettuce, 1 cup / 0.5 g  Corn (canned),  cup / 1.6 g  Tomato,  cup / 1.1 g Document Released: 02/19/2005 Document Revised: 08/21/2011 Document Reviewed: 05/24/2011 [facility] Patient Information 2013 [place], [place].

## 2012-04-11 NOTE — Telephone Encounter (Signed)
Patient Information:  Caller Name: Didier  Phone: [phone]  Patient: Trimaine, Maser  Gender: Male  DOB: 1941/02/16  Age: 72 Years  PCP: Gershon Crane [facility])  Office Follow Up:  Does the office need to follow up with this patient?: No  Instructions For The Office: N/A  RN Note:  Prior to triage, patient states would have to get back in touch as cannot talk at present due to phone connection.  Symptoms  Reason For Call & Symptoms: Has used Just for Men on face. Was seen in office 2-5 and swelling of lips has decreased after getting "Cortisone injection". Lips are very chapped 2-7.  Reviewed Health History In EMR: Yes  Reviewed Medications In EMR: Yes  Reviewed Allergies In EMR: Yes  Reviewed Surgeries / Procedures: Yes  Date of Onset of Symptoms: 04/09/2012  Guideline(s) Used:  No Protocol Available - Sick Adult  Disposition Per Guideline:   See Within 3 Days in Office  Reason For Disposition Reached:   Nursing judgment  Advice Given:  N/A

## 2012-04-11 NOTE — Telephone Encounter (Signed)
Patient Information:  Caller Name: Akshith  Phone: [phone]  Patient: Shawn Gaines, Shawn Gaines  Gender: Male  DOB: [birth year]  Age: 72 Years  PCP: Gershon Crane [facility])  Office Follow Up:  Does the office need to follow up with this patient?: No  Instructions For The Office: N/A  RN Note:  Clinical profile reviewed with patient.  Last week used "Just for Men"-face got red; lips were dried, chapped.  Tried some OTC medications that did not help.  Went in 2 days ago for steroid shot 04/09/12, Lips remain sensitive and cracked - started using a topical hydrocortisone--which seem to help.  Question from patient is regarding the fact that it stings for a couple of minutes when first applied and then if he opens mouth very wide.  Patient denies any additional symptoms with product use only notes the discomfort that is short term.  Wanted to know if pain relief may be beneficial.  RN notes that he could try something mild in lowest dosage to see.  Patient further noted that he came to office to get a second steroid shot--that he was impatient to get better.  RN offered to schedule him for [facility] office on 03/12/12, but patient declined.  Symptoms  Reason For Call & Symptoms: Reaction from hair coloring product  Reviewed Health History In EMR: Yes  Reviewed Medications In EMR: Yes  Reviewed Allergies In EMR: Yes  Reviewed Surgeries / Procedures: Yes  Date of Onset of Symptoms: 04/04/2012  Treatments Tried: Various from OTC to medical intervention  Treatments Tried Worked: Yes  Guideline(s) Used:  No Protocol Available - Information Only  Disposition Per Guideline:   Home Care  Reason For Disposition Reached:   Information only question and nurse able to answer  Advice Given:  Call Back If:  New symptoms develop  You become worse.

## 2012-04-11 NOTE — Progress Notes (Signed)
History of Present Illness:  This is a 72 year old Caucasian male who had a negative screening colonoscopy in July 2007.  He now presents with several weeks of acute constipation with abdominal gas and bloating but no significant discomfort, melena or hematochezia.  He has no urge to have a bowel movement has had to use enemas for bowel movement.  He denies been on narcotics or any other new medications except for recent Cortizone shots because of atopic facial allergies.  He exercises regularly and follows a regular diet.  He denies upper gastrointestinal or hepatobiliary or systemic complaints.  His appetite is good and his weight is stable.  Family history is noncontributory.  I have reviewed this patient's present history, medical and surgical past history, allergies and medications.     ROS:   All systems were reviewed and are negative unless otherwise stated in the HPI.    Physical Exam: At pressure 146/90, pulse 60  , weight 184 the BMI of 26.81. General well developed well nourished patient in no acute distress, appearing their stated age Rosacea appearance of face with rash around mustache and beard area and a perioral fashion. Eyes PERRLA, no icterus, fundoscopic exam per opthamologist Skin no lesions noted Neck supple, no adenopathy, no thyroid enlargement, no tenderness Chest clear to percussion and auscultation Heart no significant murmurs, gallops or rubs noted Abdomen no hepatosplenomegaly masses or tenderness, BS normal.  Rectal inspection normal no fissures, or fistulae noted.  No masses or tenderness on digital exam. Stool guaiac negative. Extremities no acute joint lesions, edema, phlebitis or evidence of cellulitis. Neurologic patient oriented x 3, cranial nerves intact, no focal neurologic deficits noted. Psychological mental status normal and normal affect.  Assessment and plan: Acute onset constipation, rule out metabolic disorder, partial colonic obstruction, versus  new-onset functional constipation related to lack of fiber in his diet.  I have placed him on a high-fiber diet with daily Metamucil and liberal by mouth fluids, MiraLax twice a day as tolerated, we will check his labs for review, and I have scheduled colonoscopy ASAP.  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  Review of his labs shows a previous mild anemia probably related to his bilateral knee replacements.  His had previous knee replacements by Dr. Robynn Pane.  Review of previous EKG shows normal sinus rhythm with occasional PVC.  The patient does have a oxygen saturation of only 86%.  Other problems include BPH and osteoarthritis.  No diagnosis found.

## 2012-04-14 ENCOUNTER — Encounter: Payer: Self-pay | Admitting: Gastroenterology

## 2012-04-14 ENCOUNTER — Encounter: Payer: Self-pay | Admitting: *Deleted

## 2012-04-14 ENCOUNTER — Ambulatory Visit (AMBULATORY_SURGERY_CENTER): Payer: Medicare Other | Admitting: Gastroenterology

## 2012-04-14 VITALS — BP 127/86 | HR 60 | Temp 96.2°F | Resp 28 | Ht 69.5 in | Wt 184.0 lb

## 2012-04-14 DIAGNOSIS — Z87898 Personal history of other specified conditions: Secondary | ICD-10-CM

## 2012-04-14 DIAGNOSIS — K5900 Constipation, unspecified: Secondary | ICD-10-CM

## 2012-04-14 DIAGNOSIS — Z860100 Personal history of colon polyps, unspecified: Secondary | ICD-10-CM

## 2012-04-14 DIAGNOSIS — Z1211 Encounter for screening for malignant neoplasm of colon: Secondary | ICD-10-CM

## 2012-04-14 MED ORDER — SODIUM CHLORIDE 0.9 % IV SOLN: 500.0000 mL | INTRAVENOUS | Status: DC

## 2012-04-14 NOTE — Progress Notes (Signed)
Patient did not have preoperative order for IV antibiotic SSI prophylaxis. (G8918)  Patient did not experience any of the following events: a burn prior to discharge; a fall within the facility; wrong site/side/patient/procedure/implant event; or a hospital transfer or hospital admission upon discharge from the facility. (G8907)  

## 2012-04-14 NOTE — Patient Instructions (Addendum)

## 2012-04-14 NOTE — Op Note (Signed)
[facility] [address] [place], [address]   COLONOSCOPY PROCEDURE REPORT  PATIENT: Shawn Gaines, Shawn Gaines.  MR#: [id number] BIRTHDATE: [birth year] , 71  yrs. old GENDER: Male ENDOSCOPIST: Mardella Layman, MD, [facility] REFERRED BY:  Gershon Crane, M.D. PROCEDURE DATE:  04/14/2012 PROCEDURE:   Colonoscopy, diagnostic and Colonoscopy, screening ASA CLASS:   Class III INDICATIONS:Patient's personal history of adenomatous colon polyps and Constipation,severe acute constipation... MEDICATIONS: propofol (Diprivan) 250mg  IV  DESCRIPTION OF PROCEDURE:   After the risks and benefits and of the procedure were explained, informed consent was obtained.  A digital rectal exam revealed external thrombosed hemorrhoids.    The LB CF-[id number]  endoscope was introduced through the anus and advanced to the cecum, which was identified by both the appendix and ileocecal valve .  The quality of the prep was poor, using MoviPrep . Very POOR EXAM per very POOR PREP!!!! The instrument was then slowly withdrawn as the colon was fully examined.     COLON FINDINGS: A normal appearing cecum, ileocecal valve, and appendiceal orifice were identified.  The ascending, hepatic flexure, transverse, splenic flexure, descending, sigmoid colon and rectum appeared unremarkable.  No polyps or cancers were seen. Retroflexed views revealed external thrombosed hemorrhoids. See pictures....    The scope was then withdrawn from the patient and the procedure completed.  COMPLICATIONS: There were no complications. ENDOSCOPIC IMPRESSION: Normal colon...poor prep..no gross lesions noted and cecum was reached...  RECOMMENDATIONS: 1.  Continue current medications..on Liness Rx. 2.  Titrate to need.Marland KitchenMarland KitchenMiralax 8ozs bid if needed 3.  High fiber diet with liberal fluid intake.   REPEAT EXAM:  cc:  _______________________________ eSignedMardella Layman, MD, [facility] 04/14/2012 3:56 PM     PATIENT NAME:   [name] MR#: [id number]

## 2012-04-15 ENCOUNTER — Telehealth: Payer: Self-pay | Admitting: *Deleted

## 2012-04-15 ENCOUNTER — Telehealth: Payer: Self-pay | Admitting: Gastroenterology

## 2012-04-15 NOTE — Telephone Encounter (Signed)
  Follow up Call-  Call back number 04/14/2012  Post procedure Call Back phone  # [phone]  Permission to leave phone message Yes     Patient questions:  Do you have a fever, pain , or abdominal swelling? no Pain Score  0 *  Have you tolerated food without any problems? yes  Have you been able to return to your normal activities? yes  Do you have any questions about your discharge instructions: Diet   no Medications  no Follow up visit  no  Do you have questions or concerns about your Care? no  Actions: * If pain score is 4 or above: No action needed, pain <4.

## 2012-04-15 NOTE — Telephone Encounter (Signed)
Patient wants to know why he needs to take Miralax.

## 2012-04-16 MED ORDER — LINACLOTIDE 145 MCG PO CAPS: ORAL_CAPSULE | ORAL | Status: DC

## 2012-04-16 NOTE — Telephone Encounter (Signed)
Explained to pt that Miralax was a safe laxative that can be used daily w/o causing dependency. We then discussed Linzess, Metamucil and Miralax and I ordered Linzess. Pt may start out with Miralax and Metamucil 1st.

## 2012-04-17 ENCOUNTER — Telehealth: Payer: Self-pay | Admitting: Internal Medicine

## 2012-04-17 DIAGNOSIS — R05 Cough: Secondary | ICD-10-CM

## 2012-04-17 DIAGNOSIS — J441 Chronic obstructive pulmonary disease with (acute) exacerbation: Secondary | ICD-10-CM

## 2012-04-17 DIAGNOSIS — J449 Chronic obstructive pulmonary disease, unspecified: Secondary | ICD-10-CM

## 2012-04-17 DIAGNOSIS — R059 Cough, unspecified: Secondary | ICD-10-CM

## 2012-04-17 NOTE — Telephone Encounter (Signed)
Pt called to inquire about results of colonoscopy.  States he needed to talk the results through. Had multiple questions including meds ordered by Dr. Jarold Motto. We discussed the normal colonoscopy limited by poor prep.  Hemorrhoids. He will use Linzess once daily and Miralax 17g daily.  Will call if not working B12 per Dr. Jarold Motto, he plans on coming for the injections. Ask for pulm referral for hx of COPD and chronic cough.  Shawn Gaines Please place Pulm referral - for eval of COPD

## 2012-04-21 ENCOUNTER — Ambulatory Visit (INDEPENDENT_AMBULATORY_CARE_PROVIDER_SITE_OTHER): Payer: Medicare Other | Admitting: Gastroenterology

## 2012-04-21 ENCOUNTER — Encounter: Payer: Self-pay | Admitting: Gastroenterology

## 2012-04-21 DIAGNOSIS — E538 Deficiency of other specified B group vitamins: Secondary | ICD-10-CM

## 2012-04-21 MED ORDER — CYANOCOBALAMIN 1000 MCG/ML IJ SOLN
1000.0000 ug | Freq: Once | INTRAMUSCULAR | Status: AC
  Administered 2012-04-21: 1000 ug via INTRAMUSCULAR

## 2012-04-21 NOTE — Telephone Encounter (Signed)
lmom for pt to call back

## 2012-04-21 NOTE — Progress Notes (Signed)
Patient ID: Shawn Gaines, male   DOB: Feb 27, 1941, 72 y.o.   MRN: [id number] Informed patient there was a nationwide shortage of B12 and that we could not make any more  b12 appointments for him at this time.  I encouraged him to check with his pcp to see if they had a supply of b12.  Patient agreed and understood.

## 2012-04-21 NOTE — Addendum Note (Signed)
Addended by: Florene Glen on: 04/21/2012 01:56 PM   Modules accepted: Orders

## 2012-04-21 NOTE — Telephone Encounter (Signed)
Scheduled pt for a Pulmonary referral with Dr Shelle Iron on 05/09/12, be there at 2:30PM. lmom for pt to call back.

## 2012-04-22 ENCOUNTER — Other Ambulatory Visit: Payer: Self-pay | Admitting: *Deleted

## 2012-04-22 NOTE — Telephone Encounter (Signed)
Called pt to inform him of his appt with Dr Shelle Iron and to see if there was anything else he needs to know about his procedure. Pt stated understanding and stated Dr Rhea Belton explained everything to him. He did report he hasn't had a BM since his procedure. He has tried Linzess, Metamucil and Miralax as well as increasing his fluids. Advised pt to take a double dose of Miralax this am with his other meds; if no BM by 3pm today, call and I will ask Dr Jarold Motto for something else. Pt stated understanding.

## 2012-04-22 NOTE — Progress Notes (Signed)
Pt left a message that he used an Enema and had good results. He will try his regular routine of miralax, metamucil and [employer]. He asked about Linzess and I informed him a script was sent to [facility] on 04/16/12.

## 2012-05-02 ENCOUNTER — Encounter: Payer: Self-pay | Admitting: Pulmonary Disease

## 2012-05-02 ENCOUNTER — Ambulatory Visit (INDEPENDENT_AMBULATORY_CARE_PROVIDER_SITE_OTHER): Payer: Medicare Other | Admitting: Pulmonary Disease

## 2012-05-02 ENCOUNTER — Ambulatory Visit (INDEPENDENT_AMBULATORY_CARE_PROVIDER_SITE_OTHER)
Admission: RE | Admit: 2012-05-02 | Discharge: 2012-05-02 | Disposition: A | Discharge status: Home / Self Care | Payer: Medicare Other | Source: Ambulatory Visit | Attending: Pulmonary Disease | Admitting: Pulmonary Disease

## 2012-05-02 VITALS — BP 124/80 | HR 70 | Temp 97.8°F | Wt 186.6 lb

## 2012-05-02 DIAGNOSIS — R059 Cough, unspecified: Secondary | ICD-10-CM

## 2012-05-02 DIAGNOSIS — R05 Cough: Secondary | ICD-10-CM

## 2012-05-02 MED ORDER — OMEPRAZOLE 40 MG PO CPDR: 40.0000 mg | DELAYED_RELEASE_CAPSULE | Freq: Every day | ORAL | Status: DC

## 2012-05-02 NOTE — Addendum Note (Signed)
Addended by: Nita Sells on: 05/02/2012 10:47 AM   Modules accepted: Orders

## 2012-05-02 NOTE — Assessment & Plan Note (Signed)
The patient has a chronic cough of 6 months duration with some dyspnea on exertion.  His spirometry today is totally normal, and although this does not rule out asthma 100%, it does rule out COPD.  I suspect his cough is coming from his upper airway, and like to take the next 2 weeks to treat for chronic allergic rhinitis and also the possibility of laryngopharyngeal reflux.  He will give me an update in 2 weeks with how things are going.  Will also check a chest x-[place] today for completeness.

## 2012-05-02 NOTE — Progress Notes (Signed)
  Subjective:    Patient ID: Shawn Gaines, male    DOB: Aug 05, 1940, 72 y.o.   MRN: [id number]  HPI The patient is a 72 year old male who been asked to see for chronic cough and some dyspnea on exertion.  He has had a dry hacking cough for the last 6 months, and states this typically comes in cycles.  He has not produced any purulent mucus.  The patient has very little tobacco use history, but a chest x-[place] in 2013 did show prominent bronchovascular markings.  He denies any history of asthma.  The patient does note significant sinus issues consisting of nasal congestion and postnasal drip at times.  He does not think he has recurrent sinus infections.  He typically will go to the gym 2 days a week, but thinks he may have a 3 block dyspnea on exertion at a moderate pace on flat ground.  He will also get winded going up steps.  He denies any frequent reflux symptoms, but does have it on occasion.  He has been tried on nasal corticosteroids but has not used on a consistent basis.  He would prefer to stay off these types of medications if possible.   Review of Systems  Constitutional: Negative for fever and unexpected weight change.  HENT: Positive for postnasal drip. Negative for ear pain, nosebleeds, congestion, sore throat, rhinorrhea, sneezing, trouble swallowing, dental problem and sinus pressure.   Eyes: Negative for redness and itching.  Respiratory: Positive for cough and shortness of breath. Negative for chest tightness and wheezing.   Cardiovascular: Negative for palpitations and leg swelling.  Gastrointestinal: Negative for nausea and vomiting.  Genitourinary: Negative for dysuria.  Musculoskeletal: Negative for joint swelling.  Skin: Positive for rash.  Neurological: Positive for dizziness ( balance issues). Negative for headaches.  Hematological: Does not bruise/bleed easily.  Psychiatric/Behavioral: Negative for dysphoric mood. The patient is nervous/anxious.        Objective:   Physical Exam Constitutional:  Well developed, no acute distress  HENT:  Nares patent but nonpurulent d/c in right nostril   Oropharynx without exudate, palate and uvula are normal  Eyes:  Perrla, eomi, no scleral icterus  Neck:  No JVD, no TMG  Cardiovascular:  Normal rate, regular rhythm, no rubs or gallops.  No murmurs        Intact distal pulses  Pulmonary :  Mildly decreased breath sounds, no stridor or respiratory distress   No rales, rhonchi, or wheezing  Abdominal:  Soft, nondistended, bowel sounds present.  No tenderness noted.   Musculoskeletal:  mild lower extremity edema noted.  Lymph Nodes:  No cervical lymphadenopathy noted  Skin:  No cyanosis noted  Neurologic:  Alert, appropriate, moves all 4 extremities without obvious deficit.         Assessment & Plan:

## 2012-05-02 NOTE — Patient Instructions (Addendum)
Get chlorpheniramine 4mg  over the counter, and take 2 each night at bedtime and one at lunch for next 2 weeks religiously Use nasonex 2 sprays each nostril each am for next 2 weeks Take omeprazole 40mg  each am for possible reflux for next 2 weeks Call me in 2 weeks with response to treatment.

## 2012-05-03 ENCOUNTER — Telehealth: Payer: Self-pay | Admitting: Pulmonary Disease

## 2012-05-03 NOTE — Telephone Encounter (Signed)
The patient called with dizziness (lightheadidness) and chills after starting Omeprazole (started two days before).   He denies fevers, has a chronic hacking cough for which he saw Dr. Shelle Iron, has been present for 6 months and unchanged, denies focal neurologic weakness, vision problems, numbness or paresthesia; denies urinary problems, abdominal problems, chest pain;   I advised him to stop the medication; stop the omeprazole and call 911 or present to the ED with worsening symptoms, or call us back on Monday with any questions.

## 2012-05-05 ENCOUNTER — Telehealth: Payer: Self-pay | Admitting: Pulmonary Disease

## 2012-05-05 NOTE — Telephone Encounter (Signed)
See phone note from Dr. Kevan Rosebush, please call pt and see how he is doing.  I still think we need to treat him for acid reflux.  If he feels the omeprazole caused this, would like to call in protonix 40mg  one each day.  Let him know same family but different medication.  His symptoms were not common for this group of medications, but everyone is different.

## 2012-05-05 NOTE — Telephone Encounter (Signed)
LMOM x 1  Patient also needs results cxr results when call back. (in my results box)

## 2012-05-06 NOTE — Progress Notes (Signed)
He can do behavioral therapies, but it may not be good enough to help his cough. 1) no eating after 8pm 2) avoid caffeine, tobacco, spicy foods 3) sleep more upright at night with bricks under head of bed or on a few pillows.  Again, I recommend treating reflux aggressively to see if the cough will improve.

## 2012-05-09 ENCOUNTER — Institutional Professional Consult (permissible substitution): Payer: Medicare Other | Admitting: Pulmonary Disease

## 2012-05-21 NOTE — Telephone Encounter (Signed)
Result Notes    Notes Recorded by Nita Sells, CMA on 05/06/2012 at 2:44 PM I have been unable to reach this patient by phone. Unable to leave vmail. ------  Notes Recorded by Barbaraann Share, MD on 05/06/2012 at 11:25 AM Can try behavioral: 1) no eating after 8pm 2) no spicy food, caffeine, alcohol, or tobacco 3) sleep more upright with pillows or bricks under head of bed.  Conservative treatment usually does not results in resolution of cough, but can try. ------     I called and spoke with patient; he is aware of recs from [facility]-he will try this for about 2 weeks and call back with report of how he is doing; pt also stated he has stopped taking omeprazole all together. Pt then stated his cough is just about gone. Will forward to [facility] to note and sign off on message.

## 2012-05-21 NOTE — Progress Notes (Signed)
Quick Note:  Pt is aware of recs from [facility] and see phone note dated 05-05-12 for further documentation. ______

## 2012-05-23 ENCOUNTER — Encounter: Payer: Self-pay | Admitting: Family Medicine

## 2012-05-23 ENCOUNTER — Ambulatory Visit (INDEPENDENT_AMBULATORY_CARE_PROVIDER_SITE_OTHER): Payer: Medicare Other | Admitting: Family Medicine

## 2012-05-23 VITALS — BP 132/80 | HR 76 | Temp 97.4°F | Wt 187.0 lb

## 2012-05-23 DIAGNOSIS — H8110 Benign paroxysmal vertigo, unspecified ear: Secondary | ICD-10-CM

## 2012-05-23 MED ORDER — FLUTICASONE PROPIONATE 50 MCG/ACT NA SUSP: 2.0000 | Freq: Every day | NASAL | Status: DC

## 2012-05-23 NOTE — Progress Notes (Signed)
  Subjective:    Patient ID: Shawn Gaines, male    DOB: May 22, 1940, 72 y.o.   MRN: [id number]  HPI Here to discuss some mild dizziness he has had for 2 weeks. This is a mild unsteadiness on his feet when he stands up or moves quickly. No HA or SOB or chest pain. He has had some sinus pressure, PND, and ear fullness without pain. Drinking fluids.    Review of Systems  Constitutional: Negative.   HENT: Positive for congestion, postnasal drip and sinus pressure.   Eyes: Negative.   Respiratory: Negative.   Neurological: Positive for dizziness. Negative for tremors, seizures, syncope, speech difficulty, weakness, light-headedness, numbness and headaches.       Objective:   Physical Exam  Constitutional: He is oriented to person, place, and time. He appears well-developed and well-nourished.  HENT:  Right Ear: External ear normal.  Left Ear: External ear normal.  Nose: Nose normal.  Mouth/Throat: Oropharynx is clear and moist.  Eyes: Conjunctivae are normal.  Lymphadenopathy:    He has no cervical adenopathy.  Neurological: He is alert and oriented to person, place, and time. He has normal reflexes. No cranial nerve deficit. He exhibits normal muscle tone. Coordination normal.          Assessment & Plan:  This sounds like mild vertigo, likely brought on by sinus congestion. Get back on Flonase daily and add Claritin D. Recheck prn

## 2012-05-24 ENCOUNTER — Emergency Department (HOSPITAL_COMMUNITY)
Admission: EM | Admit: 2012-05-24 | Discharge: 2012-05-24 | Disposition: A | Discharge status: Home / Self Care | Payer: Medicare Other | Attending: Emergency Medicine | Admitting: Emergency Medicine

## 2012-05-24 ENCOUNTER — Emergency Department (HOSPITAL_COMMUNITY): Payer: Medicare Other

## 2012-05-24 ENCOUNTER — Encounter (HOSPITAL_COMMUNITY): Payer: Self-pay | Admitting: *Deleted

## 2012-05-24 DIAGNOSIS — J3489 Other specified disorders of nose and nasal sinuses: Secondary | ICD-10-CM | POA: Insufficient documentation

## 2012-05-24 DIAGNOSIS — Z860100 Personal history of colon polyps, unspecified: Secondary | ICD-10-CM | POA: Insufficient documentation

## 2012-05-24 DIAGNOSIS — Z85828 Personal history of other malignant neoplasm of skin: Secondary | ICD-10-CM | POA: Insufficient documentation

## 2012-05-24 DIAGNOSIS — R05 Cough: Secondary | ICD-10-CM | POA: Insufficient documentation

## 2012-05-24 DIAGNOSIS — R42 Dizziness and giddiness: Secondary | ICD-10-CM

## 2012-05-24 DIAGNOSIS — IMO0002 Reserved for concepts with insufficient information to code with codable children: Secondary | ICD-10-CM | POA: Insufficient documentation

## 2012-05-24 DIAGNOSIS — J328 Other chronic sinusitis: Secondary | ICD-10-CM | POA: Insufficient documentation

## 2012-05-24 DIAGNOSIS — R059 Cough, unspecified: Secondary | ICD-10-CM | POA: Insufficient documentation

## 2012-05-24 DIAGNOSIS — J329 Chronic sinusitis, unspecified: Secondary | ICD-10-CM

## 2012-05-24 DIAGNOSIS — Z87891 Personal history of nicotine dependence: Secondary | ICD-10-CM | POA: Insufficient documentation

## 2012-05-24 DIAGNOSIS — Z8739 Personal history of other diseases of the musculoskeletal system and connective tissue: Secondary | ICD-10-CM | POA: Insufficient documentation

## 2012-05-24 DIAGNOSIS — N400 Enlarged prostate without lower urinary tract symptoms: Secondary | ICD-10-CM | POA: Insufficient documentation

## 2012-05-24 DIAGNOSIS — Z79899 Other long term (current) drug therapy: Secondary | ICD-10-CM | POA: Insufficient documentation

## 2012-05-24 LAB — CBC WITH DIFFERENTIAL/PLATELET
Basophils Absolute: 0 K/uL (ref 0.0–0.1)
Basophils Relative: 0 % (ref 0–1)
Eosinophils Absolute: 0.2 K/uL (ref 0.0–0.7)
Eosinophils Relative: 3 % (ref 0–5)
HCT: 38.4 % — ABNORMAL LOW (ref 39.0–52.0)
Hemoglobin: 14.1 g/dL (ref 13.0–17.0)
Lymphocytes Relative: 16 % (ref 12–46)
Lymphs Abs: 1.1 K/uL (ref 0.7–4.0)
MCH: 32.8 pg (ref 26.0–34.0)
MCHC: 36.7 g/dL — ABNORMAL HIGH (ref 30.0–36.0)
MCV: 89.3 fL (ref 78.0–100.0)
Monocytes Absolute: 0.7 10*3/uL (ref 0.1–1.0)
Monocytes Relative: 11 % (ref 3–12)
Neutro Abs: 4.9 10*3/uL (ref 1.7–7.7)
Neutrophils Relative %: 71 % (ref 43–77)
Platelets: 232 K/uL (ref 150–400)
RBC: 4.3 MIL/uL (ref 4.22–5.81)
RDW: 12.7 % (ref 11.5–15.5)
WBC: 7 10*3/uL (ref 4.0–10.5)

## 2012-05-24 LAB — BASIC METABOLIC PANEL
BUN: 8 mg/dL (ref 6–23)
CO2: 22 mEq/L (ref 19–32)
Chloride: 97 mEq/L (ref 96–112)
Creatinine, Ser: 0.74 mg/dL (ref 0.50–1.35)

## 2012-05-24 LAB — BASIC METABOLIC PANEL WITH GFR
Calcium: 8.9 mg/dL (ref 8.4–10.5)
GFR calc Af Amer: >90 mL/min (ref >90)
GFR calc non Af Amer: 90 mL/min — ABNORMAL LOW (ref >90)
Glucose, Bld: 97 mg/dL (ref 70–99)
Potassium: 4.4 meq/L (ref 3.5–5.1)
Sodium: 131 meq/L — ABNORMAL LOW (ref 135–145)

## 2012-05-24 LAB — POCT I-STAT TROPONIN I: Troponin i, poc: 0 ng/mL (ref 0.00–0.08)

## 2012-05-24 LAB — PRO B NATRIURETIC PEPTIDE: Pro B Natriuretic peptide (BNP): 188.4 pg/mL — ABNORMAL HIGH (ref 0–125)

## 2012-05-24 MED ORDER — TRIAMCINOLONE ACETONIDE(NASAL) 55 MCG/ACT NA INHA: 2.0000 | Freq: Every day | NASAL | Status: DC

## 2012-05-24 MED ORDER — AMOXICILLIN-POT CLAVULANATE 875-125 MG PO TABS: 1.0000 | ORAL_TABLET | Freq: Two times a day (BID) | ORAL | Status: DC

## 2012-05-24 NOTE — ED Notes (Signed)
Pt very anxious upon arrival.  After talking with pt about non-medical stuff, pt appears to be calmer and not as anxious.

## 2012-05-24 NOTE — ED Provider Notes (Signed)
History     CSN: [id number]  Arrival date & time 05/24/12  1721   First MD Initiated Contact with Patient 05/24/12 1729      Chief Complaint  Patient presents with  . Shortness of Breath    (Consider location/radiation/quality/duration/timing/severity/associated sxs/prior treatment) HPI Comments: Patient reports over the last one to 2 weeks he's been having episodes of feeling lightheaded and off balance. Patient reports that he exercises frequently and does tai chi and other aerobic-type exercises and even during exercising will have episodes where he feels lightheaded. He denies vertigo symptoms, denies any nausea. He reports no headache, loss of vision. He denies any focal numbness or weakness of his arms or legs. During these episodes, no palpitations, chest pain, shortness of breath. However independent of that he has had episodes of nasal drainage, chest congestion, cough and feeling short of breath. He denies orthopnea or paroxysmal nocturnal dyspnea. He denies prior history of coronary disease nor stroke. He saw his primary care physician yesterday and had a full examination and was told that he was healthy. Today he ate breakfast at about 8:30 consisting of cereal and decaf coffee. He reports his doctor told him to cut out caffeine. Early afternoon, after doing some house chores and yard work, he went to go fishing. While he was out fishing, he began to have significant lightheadedness, poor balance and some difficulty breathing. He was with someone who was out at his house to check for a gas leak and he continued to feel worse and therefore EMS was called. They checked his Accu-Chek and transported him here to the emergency department. Did not reveal that his Accu-Chek was abnormal. Currently, the patient reports feeling much improved and he somewhat jokingly would like to go home. Currently no HA< blurred vision, CP, SOB, nausea, sweats, fevers.    Patient is a 72 y.o. male presenting  with shortness of breath. The history is provided by the patient and the EMS personnel.  Shortness of Breath Associated symptoms: cough   Associated symptoms: no abdominal pain, no fever, no neck pain, no rash and no vomiting     Past Medical History  Diagnosis Date  . Prostatic hypertrophy   . Cancer     skin cancer- R Buttocks-bx was postive - pt not sure what kind of cancer- for removeal in March  . Arthritis     Knee  . Rotator cuff disorder     BIL  . Colon polyps     TWO HYPERPLASTIC POLYPS    Past Surgical History  Procedure Laterality Date  . Tonsillectomy    . Knee arthroplasty  2009    right  . Joint replacement  2009    R Knee  . Total knee arthroplasty  05/04/2011    Procedure: TOTAL KNEE ARTHROPLASTY;  Surgeon: Nestor Lewandowsky, MD;  Location: [facility] OR;  Service: Orthopedics;  Laterality: Left;  DEPUY  . Colonoscopy  2007    per Dr. Jarold Motto, hyperplastic polyps, repeat in 10 yrs     Family History  Problem Relation Age of Onset  . Anesthesia problems Neg Hx   . Colon cancer Neg Hx     History  Substance Use Topics  . Smoking status: Former Smoker -- 5 years    Types: Cigarettes, Pipe    Quit date: 05/04/2006  . Smokeless tobacco: Never Used     Comment: Cigs in teens--occassional "tried"---Smoked a pipe x 4-5 years.  . Alcohol Use: 1.5 oz/week  3 drink(s) per week      Review of Systems  Constitutional: Positive for fatigue. Negative for fever, chills and appetite change.  HENT: Positive for congestion. Negative for neck pain and neck stiffness.   Respiratory: Positive for cough and shortness of breath.   Gastrointestinal: Negative for nausea, vomiting, abdominal pain, diarrhea and constipation.  Musculoskeletal: Negative for back pain and arthralgias.  Skin: Negative for rash.  Neurological: Positive for dizziness and light-headedness. Negative for syncope, facial asymmetry, speech difficulty and weakness.  All other systems reviewed and are  negative.    Allergies  Doxycycline  Home Medications   Current Outpatient Rx  Name  Route  Sig  Dispense  Refill  . fluticasone (FLONASE) 50 MCG/ACT nasal spray   Nasal   Place 2 sprays into the nose daily.         . Tamsulosin HCl (FLOMAX) 0.4 MG CAPS   Oral   Take 0.4 mg by mouth daily.         Marland Kitchen amoxicillin-clavulanate (AUGMENTIN) 875-125 MG per tablet   Oral   Take 1 tablet by mouth 2 (two) times daily.   28 tablet   0   . triamcinolone (NASACORT) 55 MCG/ACT nasal inhaler   Nasal   Place 2 sprays into the nose daily. For no more than 2 weeks of usage   1 Inhaler   12     BP 135/96  Pulse 61  Temp(Src) 97.8 F (36.6 C) (Oral)  Resp 16  SpO2 98%  Physical Exam  Nursing note and vitals reviewed. Constitutional: He is oriented to person, place, and time. He appears well-developed and well-nourished. No distress.  HENT:  Head: Normocephalic and atraumatic.  Eyes: EOM are normal. Pupils are equal, round, and reactive to light.  Neck: Neck supple. No JVD present. No thyromegaly present.  Cardiovascular: Normal rate and regular rhythm.   No murmur heard. Pulmonary/Chest: Effort normal. No respiratory distress. He has no wheezes. He has no rales. He exhibits no tenderness.  Abdominal: Soft. He exhibits no distension. There is no tenderness. There is no rebound.  Neurological: He is alert and oriented to person, place, and time. No cranial nerve deficit or sensory deficit. He exhibits normal muscle tone. Coordination and gait normal. GCS eye subscore is 4. GCS verbal subscore is 5. GCS motor subscore is 6.  Minimal past pointing with right finger, symmetric near normal hell shin test, gait normal, no ataxia  Skin: Skin is warm.    ED Course  Procedures (including critical care time)  Labs Reviewed  CBC WITH DIFFERENTIAL - Abnormal; Notable for the following:    HCT 38.4 (*)    MCHC 36.7 (*)    All other components within normal limits  BASIC METABOLIC  PANEL - Abnormal; Notable for the following:    Sodium 131 (*)    GFR calc non Af Amer 90 (*)    All other components within normal limits  PRO B NATRIURETIC PEPTIDE - Abnormal; Notable for the following:    Pro B Natriuretic peptide (BNP) 188.4 (*)    All other components within normal limits  POCT I-STAT TROPONIN I   Dg Chest 2 View  05/24/2012  *RADIOLOGY REPORT*  Clinical Data: Chest pain and short of breath.  CHEST - 2 VIEW  Comparison: 05/02/2012  Findings: COPD with hyperinflation.  Negative for pneumonia. Negative for heart failure or effusion.  IMPRESSION: COPD without acute cardiopulmonary abnormality.   Original Report Authenticated By: Janeece Riggers, M.D.  Ct Head Wo Contrast  05/24/2012  *RADIOLOGY REPORT*  Clinical Data: Chest tightness and short of breath  CT HEAD WITHOUT CONTRAST  Technique:  Contiguous axial images were obtained from the base of the skull through the vertex without contrast.  Comparison: None  Findings: Mild atrophy.  Mild chronic microvascular ischemia in the white matter.  No acute infarct.  Negative for hemorrhage or mass. Atherosclerotic disease is present.  Chronic sinusitis with opacification of the right frontal sinus and mucosal thickening in the right ethmoid sinus.  IMPRESSION: No acute intracranial abnormality.   Original Report Authenticated By: Janeece Riggers, M.D.      1. Dizziness   2. Chronic sinusitis     Room air saturation is 100% and I interpret this to be normal.   EKG performed at time 19:20, shows sinus rhythm at a rate of 65 with multiple PVCs noted. Otherwise normal axis. No ST or T-wave abnormalities. No prior EKG is noted. Interpretation is borderline EKG.  9:01 PM Head CT shows no infarct, shows chronic right sinusitis which could explain some of his symptoms.  Will put on abx and steroid nasal spray.  Pt has been ambulatory fine back and forth, doubt CVA.  CXR, troponin, ECG are ok.  MDM  Pt with dyspnea, evidence of nasal  congestion.  Pt reports was given nasal steroid previously with improvement in congestion and the coughing for a while by PCP, asks for more of same.  Pt feels improved now.  Pt didn't eat lunch, symptoms began around 3 PM.  Today was also warmer that it has been this winter.  Pt's gait is normal here, ECG shows no ischemia.  Will get head CT, cardiac eval, CXR and continue to monitor.  No CP at present.          Gavin Pound. Oletta Lamas, MD 05/24/12 2224

## 2012-05-24 NOTE — Discharge Instructions (Signed)
Dizziness Dizziness is a common problem. It is a feeling of unsteadiness or lightheadedness. You may feel like you are about to faint. Dizziness can lead to injury if you stumble or fall. A person of any age group can suffer from dizziness, but dizziness is more common in older adults. CAUSES  Dizziness can be caused by many different things, including:  Middle ear problems.  Standing for too long.  Infections.  An allergic reaction.  Aging.  An emotional response to something, such as the sight of blood.  Side effects of medicines.  Fatigue.  Problems with circulation or blood pressure.  Excess use of alcohol, medicines, or illegal drug use.  Breathing too fast (hyperventilation).  An arrhythmia or problems with your heart rhythm.  Low red blood cell count (anemia).  Pregnancy.  Vomiting, diarrhea, fever, or other illnesses that cause dehydration.  Diseases or conditions such as Parkinson's disease, high blood pressure (hypertension), diabetes, and thyroid problems.  Exposure to extreme heat. DIAGNOSIS  To find the cause of your dizziness, your caregiver may do a physical exam, lab tests, radiologic imaging scans, or an electrocardiography test (ECG).  TREATMENT  Treatment of dizziness depends on the cause of your symptoms and can vary greatly. HOME CARE INSTRUCTIONS   Drink enough fluids to keep your urine clear or pale yellow. This is especially important in very hot weather. In the elderly, it is also important in cold weather.  If your dizziness is caused by medicines, take them exactly as directed. When taking blood pressure medicines, it is especially important to get up slowly.  Rise slowly from chairs and steady yourself until you feel okay.  In the morning, first sit up on the side of the bed. When this seems okay, stand slowly while holding onto something until you know your balance is fine.  If you need to stand in one place for a long time, be sure to  move your legs often. Tighten and relax the muscles in your legs while standing.  If dizziness continues to be a problem, have someone stay with you for a day or two. Do this until you feel you are well enough to stay alone. Have the person call your caregiver if he or she notices changes in you that are concerning.  Do not drive or use heavy machinery if you feel dizzy.  Do not drink alcohol. SEEK IMMEDIATE MEDICAL CARE IF:   Your dizziness or lightheadedness gets worse.  You feel nauseous or vomit.  You develop problems with talking, walking, weakness, or using your arms, hands, or legs.  You are not thinking clearly or you have difficulty forming sentences. It may take a friend or family member to determine if your thinking is normal.  You develop chest pain, abdominal pain, shortness of breath, or sweating.  Your vision changes.  You notice any bleeding.  You have side effects from medicine that seems to be getting worse rather than better. MAKE SURE YOU:   Understand these instructions.  Will watch your condition.  Will get help right away if you are not doing well or get worse. Document Released: 08/15/2000 Document Revised: 05/14/2011 Document Reviewed: 09/08/2010 [facility] Patient Information 2013 [place], [place]. Sinusitis Sinusitis is redness, soreness, and swelling (inflammation) of the paranasal sinuses. Paranasal sinuses are air pockets within the bones of your face (beneath the eyes, the middle of the forehead, or above the eyes). In healthy paranasal sinuses, mucus is able to drain out, and air is able to  circulate through them by way of your nose. However, when your paranasal sinuses are inflamed, mucus and air can become trapped. This can allow bacteria and other germs to grow and cause infection. Sinusitis can develop quickly and last only a short time (acute) or continue over a long period (chronic). Sinusitis that lasts for more than 12 weeks is considered  chronic.  CAUSES  Causes of sinusitis include:  Allergies.  Structural abnormalities, such as displacement of the cartilage that separates your nostrils (deviated septum), which can decrease the air flow through your nose and sinuses and affect sinus drainage.  Functional abnormalities, such as when the small hairs (cilia) that line your sinuses and help remove mucus do not work properly or are not present. SYMPTOMS  Symptoms of acute and chronic sinusitis are the same. The primary symptoms are pain and pressure around the affected sinuses. Other symptoms include:  Upper toothache.  Earache.  Headache.  Bad breath.  Decreased sense of smell and taste.  A cough, which worsens when you are lying flat.  Fatigue.  Fever.  Thick drainage from your nose, which often is green and may contain pus (purulent).  Swelling and warmth over the affected sinuses. DIAGNOSIS  Your caregiver will perform a physical exam. During the exam, your caregiver may:  Look in your nose for signs of abnormal growths in your nostrils (nasal polyps).  Tap over the affected sinus to check for signs of infection.  View the inside of your sinuses (endoscopy) with a special imaging device with a light attached (endoscope), which is inserted into your sinuses. If your caregiver suspects that you have chronic sinusitis, one or more of the following tests may be recommended:  Allergy tests.  Nasal culture A sample of mucus is taken from your nose and sent to a lab and screened for bacteria.  Nasal cytology A sample of mucus is taken from your nose and examined by your caregiver to determine if your sinusitis is related to an allergy. TREATMENT  Most cases of acute sinusitis are related to a viral infection and will resolve on their own within 10 days. Sometimes medicines are prescribed to help relieve symptoms (pain medicine, decongestants, nasal steroid sprays, or saline sprays).  However, for sinusitis  related to a bacterial infection, your caregiver will prescribe antibiotic medicines. These are medicines that will help kill the bacteria causing the infection.  Rarely, sinusitis is caused by a fungal infection. In theses cases, your caregiver will prescribe antifungal medicine. For some cases of chronic sinusitis, surgery is needed. Generally, these are cases in which sinusitis recurs more than 3 times per year, despite other treatments. HOME CARE INSTRUCTIONS   Drink plenty of water. Water helps thin the mucus so your sinuses can drain more easily.  Use a humidifier.  Inhale steam 3 to 4 times a day (for example, sit in the bathroom with the shower running).  Apply a warm, moist washcloth to your face 3 to 4 times a day, or as directed by your caregiver.  Use saline nasal sprays to help moisten and clean your sinuses.  Take over-the-counter or prescription medicines for pain, discomfort, or fever only as directed by your caregiver. SEEK IMMEDIATE MEDICAL CARE IF:  You have increasing pain or severe headaches.  You have nausea, vomiting, or drowsiness.  You have swelling around your face.  You have vision problems.  You have a stiff neck.  You have difficulty breathing. MAKE SURE YOU:   Understand these instructions.  Will watch your condition.  Will get help right away if you are not doing well or get worse. Document Released: 02/19/2005 Document Revised: 05/14/2011 Document Reviewed: 03/06/2011 [facility] Patient Information 2013 [place], [place].

## 2012-05-24 NOTE — ED Notes (Signed)
The [place].  He had a sudden onset of sob with some chest tightness.  Initially had some nausea and dizziness.  In and out of af according to the paramedics.  No history.  The pt was seen at his doctors 2-3 days ago for chest tightness and not breathing well .  Chest tightness for 3-4 months.  On arrival alert c/o sl nausea.  He also had a headache and very anxious iv per ems bolus

## 2012-05-25 ENCOUNTER — Telehealth: Payer: Self-pay | Admitting: Family Medicine

## 2012-05-27 ENCOUNTER — Telehealth: Payer: Self-pay | Admitting: Family Medicine

## 2012-05-27 MED ORDER — AZITHROMYCIN 250 MG PO TABS: ORAL_TABLET | ORAL | Status: DC

## 2012-05-27 NOTE — Telephone Encounter (Signed)
pls advise

## 2012-05-27 NOTE — Telephone Encounter (Signed)
[facility] Triage Call Report Triage Record Num: [id number] Operator: Tomasita Crumble Patient Name: Shawn Gaines Call Date & Time: 05/25/2012 9:07:20PM Patient Phone: [phone] PCP: Tera Mater. Clent Ridges Patient Gender: Male PCP Fax : [phone] Patient DOB: 07/13/1940 Practice Name: [facility]  Reason for Call: Caller: Dakin/Patient; PCP: Gershon Crane [facility] CB#: [phone]; Call regarding he was not able to get Augmentin at [facility] before pharmacy closed. He dropped off the Rx at pharmacy but failed to get there before they closed to pick it up. It was difficult to get him focused to answer questions related to his medical history. Advised since the Rx was taken to pharmacy earlier in the day it is not likely that another pharmacy would be able to fill it and that the instructions he has without MD signature does not constitute Rx and that starting Rx in am should be ok. Recommended that he search for a 24 hour [facility] pharmacy. Caller agreed. Medication Questions protocol used.  Protocol(s) Used: Medication Questions - Adult Recommended Outcome per Protocol: Provided Health Information Reason for Outcome: Caller has medication question(s) that was answered with available resources

## 2012-05-27 NOTE — Telephone Encounter (Signed)
Stop the Augmentin and switch to a Zpack, please call this in

## 2012-05-27 NOTE — Telephone Encounter (Signed)
Patient Information:  Caller Name: Dacota  Phone: [phone]  Patient: Shawn Gaines, Shawn Gaines  Gender: Male  DOB: Aug 07, 1940  Age: 72 Years  PCP: Gershon Crane [facility])  Office Follow Up:  Does the office need to follow up with this patient?: Yes  Instructions For The Office: Please follow up with appointment or further instruction.  See nurses notes for details.  RN Note:  Patient was recently in the emergency room for Dizziness and was diagnosed with sinus infection and placed on antibiotics Amox-Clav.  Patient states that he took his fourth pill today 05/27/12 and started with dizziness that has been progressive each hour.  Patient has had 2-3 glasses of fluid today.  States some improvement in sinus symptoms.  Patient is able to stand up and walk around with little difficulty.  States that when sitting down experiences more of a lightheadedness feeling.  Patient states that he is no different than he was on Friday and there are no new symtpoms.  Patient is not as dizzy now as he was earlier in the day and feels that it may be related to the medication that he is on. No appointments available with Dr. Clent Ridges.  Message sent for follow up.  Symptoms  Reason For Call & Symptoms: Dizziness  Reviewed Health History In EMR: Yes  Reviewed Medications In EMR: Yes  Reviewed Allergies In EMR: Yes  Reviewed Surgeries / Procedures: Yes  Date of Onset of Symptoms: 05/27/2012  Guideline(s) Used:  Dizziness  Disposition Per Guideline:   Go to Office Now  Reason For Disposition Reached:   Lightheadedness (dizziness) present now, after 2 hours of rest and fluids  Advice Given:  Drink Fluids:  Drink several glasses of fruit juice, other clear fluids, or water. This will improve hydration and blood glucose. If you have a fever or have had heat exposure, make sure the fluids are cold.  Rest for 1-2 Hours:  Lie down with feet elevated for 1 hour. This will improve blood flow and increase blood flow to the  brain.  Call Back If:  Still feel dizzy after 2 hours of rest and fluids  Passes out (faints)  You become worse.  Patient Will Follow Care Advice:  YES

## 2012-05-27 NOTE — Telephone Encounter (Signed)
I spoke with pt and sent script e-scribe. 

## 2012-05-27 NOTE — Telephone Encounter (Signed)
His dizziness is not related to the antibiotic but to the sinus infection itself. Stay on the antibiotic

## 2012-05-27 NOTE — Telephone Encounter (Signed)
Pt is not satisfied with that answer. He was not dizzy until he started the antibiotic and is requesting a call from you.

## 2012-05-28 ENCOUNTER — Telehealth: Payer: Self-pay | Admitting: Family Medicine

## 2012-05-28 NOTE — Telephone Encounter (Signed)
I spoke with pt and Dr. Clent Ridges did change the medication to a Zpack. I sent script e-scribe to pharmacy.

## 2012-05-28 NOTE — Telephone Encounter (Signed)
[facility] Triage Call Report Triage Record Num: [id number] Operator: Kelle Darting Patient Name: Kaiyu Mirabal Call Date & Time: 05/27/2012 5:08:28PM Patient Phone: [phone] PCP: Tera Mater. Clent Ridges Patient Gender: Male PCP Fax : [phone] Patient DOB: 1940/07/07 Practice Name: [facility] Reason for Call: Caller: Kong/Patient; PCP: Gershon Crane [facility]); CB#: [phone]; Call regarding Dizzy off and on; Afebrile; Onset: 05/27/12; Sx notes: Gets dizzy and feels bad about an hour after taking the 4th pill of Augmentin, that started about an hour after taking, feels dizzy, not well, this feeling lasts about an hour to an hour and half, denies any other sx, and denies any sx at this time; was seen over the weekend at [facility] ED, diagnosed with a Sinus Infection; States the Nasonex is working great but does not like the feeling that the Augmentin causes, states he is going to take one more pill, before going to bed tonight and see how that does; is to take it twice per day; Patient not triaged, did not want to answer a lot of questions, states he had called the office earlier today and informed Dr. Clent Ridges how the medication made him feel, does not understand why he is to continue taking them if they make him feel so bad; Advised patient to call back first thing in morning, before next dose is due, and see if there is another option for him regarding his sinus infection; Note to office. Protocol(s) Used: Office Note Recommended Outcome per Protocol: Information Noted and Sent to Office Reason for Outcome: Caller information to office Care Advice: ~ 03/

## 2012-05-29 ENCOUNTER — Telehealth: Payer: Self-pay | Admitting: Family Medicine

## 2012-05-29 NOTE — Telephone Encounter (Signed)
Attempted to reach patient regarding using pepto-bismol for augmentin-induced diarrhea.  On callback, unable to reach patient at number given; message left on unidentified voicemail to call office for assistance.  krs/can

## 2012-05-30 ENCOUNTER — Telehealth: Payer: Self-pay | Admitting: Family Medicine

## 2012-05-30 ENCOUNTER — Ambulatory Visit (INDEPENDENT_AMBULATORY_CARE_PROVIDER_SITE_OTHER): Payer: Medicare Other

## 2012-05-30 ENCOUNTER — Ambulatory Visit: Payer: Medicare Other

## 2012-05-30 DIAGNOSIS — E539 Vitamin B deficiency, unspecified: Secondary | ICD-10-CM

## 2012-05-30 MED ORDER — CYANOCOBALAMIN 1000 MCG/ML IJ SOLN
1000.0000 ug | Freq: Once | INTRAMUSCULAR | Status: AC
  Administered 2012-05-30: 1000 ug via INTRAMUSCULAR

## 2012-05-30 NOTE — Telephone Encounter (Signed)
Shawn Gaines , DOB-1941-02-11, PCP Gershon Crane [facility]).  Called in to office in regards to Vitamin B12.  Attempted to call back patient twice . No Contact. Reached voicemail. Left message to please contact the office for assistance.

## 2012-05-30 NOTE — Telephone Encounter (Signed)
Coming in for B12 inj w/Tamesha this afternoon. Would like recommendation then re below issues.

## 2012-05-30 NOTE — Telephone Encounter (Signed)
Per Dr. Clent Ridges, pt can take Align & Imodium.

## 2012-05-30 NOTE — Telephone Encounter (Signed)
Patient Information:  Caller Name: Arlie  Phone: [phone]  Patient: Shawn Gaines, Shawn Gaines  Gender: Male  DOB: 09/03/1940  Age: 72 Years  PCP: Gershon Crane [facility])  Office Follow Up:  Does the office need to follow up with this patient?: Yes  Instructions For The Office: Call with MD recommendations for diarrhea.  Using Pepto-Bismol now. OK to give recommendation when comes in this afternoon for Vit B12 injection.  RN Note:  Did not start Zpack; ENT spoke with Dr Clent Ridges and decided Augmentin was optimal medication. Advised to eat yogurt BID or take a probiotic.  Bland diet reviewed. Will be coming in at 1315 for Vit B12 injection; OK to give MD recommendation for otc medication to use for diarrhea at that time as he is going to gym now.  Symptoms  Reason For Call & Symptoms: Called to ask what can take for mild diarrhea while taking Augmentin for sinus infection.  Noted 4 stools yesterday and 2 watery stools so far today.  Reviewed Health History In EMR: Yes  Reviewed Medications In EMR: Yes  Reviewed Allergies In EMR: Yes  Reviewed Surgeries / Procedures: Yes  Date of Onset of Symptoms: 05/29/2012  Treatments Tried: Pepto-Bismol, Rice  Treatments Tried Worked: No  Guideline(s) Used:  Diarrhea  Disposition Per Guideline:   Callback by PCP Today  Reason For Disposition Reached:   Recent antibiotic therapy (i.e., within last 2 months)  Advice Given:  Fluids:  Drink more fluids, at least 8-10 glasses (8 oz or 240 ml) daily.  Supplement this with saltine crackers or soups to make certain that you are getting sufficient fluid and salt to meet your body's needs.  Avoid caffeinated beverages (Reason: caffeine is mildly dehydrating).  Nutrition:  Ideal initial foods include boiled starches/cereals (e.g., potatoes, rice, noodles, wheat, oats) with a small amount of salt to taste.  Other acceptable foods include: bananas, yogurt, crackers, soup.  Diarrhea Medication  - Imodium AD:   Helps reduce diarrhea.  Patient Will Follow Care Advice:  YES

## 2012-05-30 NOTE — Telephone Encounter (Signed)
See below note.

## 2012-05-30 NOTE — Telephone Encounter (Signed)
Patient Shawn Gaines calling in with a medication issue.  Attempted to call patient back . Only voicemail  Reached  [phone]. No contact. Left message to contact with office for assistance if needed.

## 2012-05-30 NOTE — Telephone Encounter (Signed)
lmom for pt to call back. Pt is sch for b12 inj today

## 2012-05-30 NOTE — Telephone Encounter (Signed)
Pt is aware of b12 inj appt today. Pt requesting to talk to AMY CAN. Pt was transferred to CAN

## 2012-06-02 ENCOUNTER — Telehealth: Payer: Self-pay | Admitting: Family Medicine

## 2012-06-02 NOTE — Telephone Encounter (Signed)
[facility] Triage Call Report Triage Record Num: [id number] Operator: Jeraldine Loots Patient Name: [name] Call Date & Time: 05/30/2012 6:27:30PM Patient Phone: [phone] PCP: Tera Mater. Clent Ridges Patient Gender: Male PCP Fax : [phone] Patient DOB: October 03, 1940 Practice Name: [facility] Reason for Call: Caller: Haley/Patient; PCP: Gershon Crane [facility]); CB#: [phone]; Call regarding diarrhea after starting the Augmentin. Wants to know what foods to avoid so that the diarrhea doesn't worsen. Has only had 1 bm today. Advised to avoid fried, greasy foods but to push liquids to prevent dehydration. Suggested that he put a glass in the restroom and everytime he urinates or has a bm to drink 4-6 oz. S/S dehydration discussed with patient. Protocol(s) Used: Office Note Recommended Outcome per Protocol: Information Noted and Sent to Office Reason for Outcome: Caller information to office Care Advice: ~ 03/28/

## 2012-06-03 ENCOUNTER — Telehealth: Payer: Self-pay | Admitting: Family Medicine

## 2012-06-03 DIAGNOSIS — R42 Dizziness and giddiness: Secondary | ICD-10-CM

## 2012-06-03 NOTE — Telephone Encounter (Signed)
Patient Information:  Caller Name: Chananya  Phone: [phone]  Patient: Shawn Gaines, Shawn Gaines  Gender: Male  DOB: Aug 22, 1940  Age: 72 Years  PCP: Gershon Crane [facility])  Office Follow Up:  Does the office need to follow up with this patient?: Yes  Instructions For The Office: Caller would like a Neurologist consult. Caller would also like a new antibiotic because he does not like the way he feels on Augmentin. Caller is having shortness of breath, dizziness, and intermittent slurred speech. Caller refusing appointment and Emergency Department.  RN Note:  Caller states his nerves are "shot". Would like follow up with Neurologist. Intermittently lightheaded and dizzy. Diarrhea present per caller. Caller is eating, drinking, and voiding within normal limits. Caller would like a new antibiotic.  Symptoms  Reason For Call & Symptoms: Caller states his speech is slurred intermittently, weakness, and very tired. Caller states the Augmentin is messing him up and he does not want to take it. Caller states his breathing is not as "easy" as normal.  Reviewed Health History In EMR: Yes  Reviewed Medications In EMR: Yes  Reviewed Allergies In EMR: Yes  Reviewed Surgeries / Procedures: Yes  Date of Onset of Symptoms: Unknown  Guideline(s) Used:  Breathing Difficulty  Disposition Per Guideline:   Go to Office Now  Reason For Disposition Reached:   Mild difficulty breathing (e.g., minimal/no SOB at rest, SOB with walking, pulse < 100) of new onset or worse than normal  Advice Given:  General Care Advice for Breathing Difficulty:  Find position of greatest comfort. For most patients the best position is semi-upright (e.g., sitting up in a comfortable chair or lying back against pillows).  Elevate head of bed (e.g., use pillows or place blocks under bed).  Avoid smoke or fume exposure.  Create a draft (e.g., use a fan directed at the face, or open a window).  Keep room temperature slightly on the  cool side.  Limit activities or space activities apart during the day. Prioritize activities.  Use a humidifier.  Call Back If:  Severe difficulty breathing occurs  Fever more than 100.5 F (38.1 C)  You become worse.  Patient Refused Recommendation:  Patient Requests Prescription  Caller does not want to come to the office or go to the ED

## 2012-06-03 NOTE — Telephone Encounter (Signed)
Patient Information:  Caller Name: Shawn Gaines  Phone: [phone]  Patient: Shawn Gaines, Shawn Gaines  Gender: Male  DOB: Jul 12, 1940  Age: 72 Years  PCP: Shawn Gaines [facility])  Office Follow Up:  Does the office need to follow up with this patient?: Yes  Instructions For The Office: PLEASE call back; wants to know if he should continue the Augmentin, what to do for the diarrhea and needs a neurology consult   Symptoms  Reason For Call & Symptoms: Shawn Gaines is now calling because he wonders if he should take the Augmentin; informed him that if he feels like he is having problems with them, he should wait until he hears from the office before taking them  Reviewed Health History In EMR: N/A  Reviewed Medications In EMR: N/A  Reviewed Allergies In EMR: N/A  Reviewed Surgeries / Procedures: N/A  Date of Onset of Symptoms: Unknown  Guideline(s) Used:  No Protocol Available - Sick Adult  Disposition Per Guideline:   Discuss with PCP and Callback by Nurse within 1 Hour  Reason For Disposition Reached:   Nursing judgment  Advice Given:  N/A  Patient Refused Recommendation:  Patient Will Follow Up With Office Later  wants to talk to someone at the office

## 2012-06-03 NOTE — Telephone Encounter (Signed)
I put in a referral for Neurology. He should try Imodium for the diarrhea

## 2012-06-03 NOTE — Telephone Encounter (Signed)
Patient Information:  Caller Name: Jamaree  Phone: [phone]  Patient: Shawn Gaines, Shawn Gaines  Gender: Male  DOB: [birth year]  Age: 72 Years  PCP: Gershon Crane [facility])  Office Follow Up:  Does the office need to follow up with this patient?: Yes  Instructions For The Office: requests call back about a neurology referral and something for diarrhea; refuses appt   Symptoms  Reason For Call & Symptoms: Jeorge calling back because he says his SOB is worse and he hasn't heard from the office; saw last note today that advised an appt or ED visit; Maximillion refuses either; he states he only needs a neurology consult  and something to counteract the diarrhea that the Augmentin gave him; PLEASE call back  Reviewed Health History In EMR: N/A  Reviewed Medications In EMR: N/A  Reviewed Allergies In EMR: N/A  Reviewed Surgeries / Procedures: N/A  Date of Onset of Symptoms: Unknown  Guideline(s) Used:  No Protocol Available - Sick Adult  Disposition Per Guideline:   Discuss with PCP and Callback by Nurse within 1 Hour  Reason For Disposition Reached:   Nursing judgment  Advice Given:  N/A  Patient Will Follow Care Advice:  YES

## 2012-06-03 NOTE — Telephone Encounter (Signed)
Spoke with patient and he verbally understood

## 2012-06-04 ENCOUNTER — Telehealth: Payer: Self-pay | Admitting: Family Medicine

## 2012-06-04 NOTE — Telephone Encounter (Signed)
I spoke with pt and he has talked to the triage nurse and does not need the below advise from Dr. Clent Ridges.

## 2012-06-04 NOTE — Telephone Encounter (Signed)
Okay. Do not take the Zpack. Take Augmentin as above bid for 10 days. Call in enough for a 10 day supply. Use OTC Imodium along with it.

## 2012-06-04 NOTE — Telephone Encounter (Signed)
Patient Information:  Caller Name: [place]  Phone: [phone]  Patient: Shawn Gaines, Shawn Gaines  Gender: Male  DOB: 1940-11-17  Age: 72 Years  PCP: Gershon Crane [facility])  Office Follow Up:  Does the office need to follow up with this patient?: No  Instructions For The Office: N/A  RN Note:  Pt calling to ask to be placed back on Augmentin w/ Anti-diarrial med due to Dr Lazarus Salines, ENT, advised Pt Zpack would not help his Sinus Infection.  Zpack was sent on 3-26.  Pt still has Augmentin 875-125mg  for 6 days written by Dr Clent Ridges.  Pt states the Cough has improved and feels the Augmentin is working. Pt will take the remaining Augmentin w/ OTC Imodium he has at home, Pt has never started Zpack and will follow up w/ Dr Clent Ridges once Augmentin is complete. Triage offered.  Pt would like note sent to Dr Clent Ridges.  Symptoms  Reason For Call & Symptoms: F/U from Sinus Infection , Augmentin and Zpak  Reviewed Health History In EMR: Yes  Reviewed Medications In EMR: Yes  Reviewed Allergies In EMR: Yes  Reviewed Surgeries / Procedures: Yes  Date of Onset of Symptoms: 05/28/2012  Guideline(s) Used:  No Protocol Available - Information Only  Disposition Per Guideline:   Home Care  Reason For Disposition Reached:   Information only question and nurse able to answer  Advice Given:  N/A  Patient Will Follow Care Advice:  YES

## 2012-06-05 ENCOUNTER — Telehealth: Payer: Self-pay | Admitting: Family Medicine

## 2012-06-05 NOTE — Telephone Encounter (Signed)
Spoke with patient and reminded him that he can call Dr Shelle Iron as he has seen him in the past.

## 2012-06-05 NOTE — Telephone Encounter (Signed)
Patient Information:  Caller Name: Silver  Phone: [phone]  Patient: Shawn Gaines, Shawn Gaines  Gender: Male  DOB: [birth year]  Age: 73 Years  PCP: Gershon Crane [facility])  Office Follow Up:  Does the office need to follow up with this patient?: Yes  Instructions For The Office: Please call Shawn Gaines at [phone] as soon as possible.  Patient declined appointment and requests to get a referral to a Pulmonary Specialist.  RN Note:  Patient declined appoint and requests to get a referral to a Pulmonary Specialist.  Symptoms  Reason For Call & Symptoms: Onset one month ago difficulty breathing post meals.  Patient wants to get a referral to a Pulmonary Specialist  Reviewed Health History In EMR: Yes  Reviewed Medications In EMR: Yes  Reviewed Allergies In EMR: Yes  Reviewed Surgeries / Procedures: Yes  Date of Onset of Symptoms: 05/05/2012  Guideline(s) Used:  Breathing Difficulty  Disposition Per Guideline:   See Within 3 Days in Office  Reason For Disposition Reached:   Moderate longstanding difficulty breathing (e.g., speaks in phrases, SOB even at rest, pulse 100-120) and same as normal  Advice Given:  General Care Advice for Breathing Difficulty:  Find position of greatest comfort. For most patients the best position is semi-upright (e.g., sitting up in a comfortable chair or lying back against pillows).  Elevate head of bed (e.g., use pillows or place blocks under bed).  Avoid smoke or fume exposure.  Create a draft (e.g., use a fan directed at the face, or open a window).  Keep room temperature slightly on the cool side.  Limit activities or space activities apart during the day. Prioritize activities.  Use a humidifier.  Call Back If:  Severe difficulty breathing occurs  Fever more than 100.5 F (38.1 C)  You become worse.  Patient Refused Recommendation:  Patient Refused Care Advice  Patient declined appointment and requests to get a referral to a Pulmonary  Specialist.

## 2012-06-06 ENCOUNTER — Encounter: Payer: Self-pay | Admitting: Pulmonary Disease

## 2012-06-06 ENCOUNTER — Ambulatory Visit (INDEPENDENT_AMBULATORY_CARE_PROVIDER_SITE_OTHER): Payer: Medicare Other | Admitting: Pulmonary Disease

## 2012-06-06 ENCOUNTER — Ambulatory Visit (INDEPENDENT_AMBULATORY_CARE_PROVIDER_SITE_OTHER): Payer: Medicare Other | Admitting: Family Medicine

## 2012-06-06 ENCOUNTER — Telehealth: Payer: Self-pay | Admitting: Pulmonary Disease

## 2012-06-06 VITALS — BP 128/84 | HR 70 | Temp 97.6°F | Ht 72.0 in | Wt 185.0 lb

## 2012-06-06 DIAGNOSIS — R059 Cough, unspecified: Secondary | ICD-10-CM

## 2012-06-06 DIAGNOSIS — E538 Deficiency of other specified B group vitamins: Secondary | ICD-10-CM

## 2012-06-06 DIAGNOSIS — R05 Cough: Secondary | ICD-10-CM

## 2012-06-06 MED ORDER — CYANOCOBALAMIN 1000 MCG/ML IJ SOLN
1000.0000 ug | Freq: Once | INTRAMUSCULAR | Status: AC
  Administered 2012-06-06: 1000 ug via INTRAMUSCULAR

## 2012-06-06 NOTE — Telephone Encounter (Signed)
He can use the saline spray and an antihistamine to help with his postnasal drip.

## 2012-06-06 NOTE — Telephone Encounter (Signed)
Patient seen in office today, states he and Dr. Shelle Iron had a discussion in the room about "nasal spray/pills", Patient is Concerned if the nasal spray will be enough to eliminate the PND he has OR Should he do the pills OR should he do BOTH-- which one is "more powerful" Patient states he does not want to be hacking and coughing all the time bc of the pnd. Dr. Shelle Iron please advise for patient, thank you!  OV instructions: Patient Instructions    Stay on your nasal spray, and can use saline to help with dryness/congestion as well.  If your cough worsens, would consider whether reflux may be playing a role.  followup with me as needed.

## 2012-06-06 NOTE — Progress Notes (Signed)
  Subjective:    Patient ID: Shawn Gaines, male    DOB: 08/14/1940, 72 y.o.   MRN: [id number]  HPI The patient comes in today for followup of his persistent cough.  At the last visit this was felt to be upper airway in origin, and probably related to postnasal drip and reflux.  His chest x-Nikolis showed no acute process, and his spirometry shows no airflow obstruction.  He was started on an antihistamine and nasal steroid, as well has a proton pump inhibitor for possible LPR.  He comes in today where he did not take the antihistamine or fill the prescription for the proton pump inhibitor, but did use a nasal corticosteroid with an excellent response.  He would like to stay on this medication.  In the interim, he tells me that he has been diagnosed with a severe sinus infection by otolaryngology.  He states that his cough has significantly improved.  He is still having some shortness of breath.   Review of Systems  Constitutional: Negative for fever and unexpected weight change.  HENT: Negative for ear pain, nosebleeds, congestion, sore throat, rhinorrhea, sneezing, trouble swallowing, dental problem, postnasal drip and sinus pressure.   Eyes: Negative for redness and itching.  Respiratory: Negative for cough, chest tightness, shortness of breath and wheezing.   Cardiovascular: Negative for palpitations and leg swelling.  Gastrointestinal: Negative for nausea and vomiting.  Genitourinary: Negative for dysuria.  Musculoskeletal: Negative for joint swelling.  Skin: Negative for rash.  Neurological: Negative for headaches.  Hematological: Does not bruise/bleed easily.  Psychiatric/Behavioral: Negative for dysphoric mood. The patient is not nervous/anxious.        Objective:   Physical Exam Thin male in no acute distress Nose without purulence or discharge noted Neck without lymphadenopathy or thyromegaly Chest totally clear to auscultation, no wheezing Cardiac exam with regular rate and  rhythm Lower extremities without edema, no cyanosis Alert and oriented, moves all 4 extremities.       Assessment & Plan:

## 2012-06-06 NOTE — Assessment & Plan Note (Signed)
The patient's cough is much improved since being on the nasal steroid.  He felt that he did not need antihistamine or the proton pump inhibitor.  I have asked him to continue on the nasal steroid, try nasal saline rinsing of his nasal passage, and to keep his followup with otolaryngology for further treatment of his chronic sinus disease.  If his cough escalates again, I would consider treatment of possible laryngopharyngeal reflux.  I do not see any pulmonary disease to explain his history of cough.

## 2012-06-06 NOTE — Telephone Encounter (Signed)
Spoke with pt and notified of recs per KC He verbalized understanding and states nothing further needed  

## 2012-06-06 NOTE — Patient Instructions (Addendum)
Stay on your nasal spray, and can use saline to help with dryness/congestion as well. If your cough worsens, would consider whether reflux may be playing a role. followup with me as needed.

## 2012-06-06 NOTE — Telephone Encounter (Signed)
Spoke with patient, patient states Dr. Carver Fila office is on back order for his vitamin B12 injectons. Patient would like to know if he could possibly get shots here if need be.  I have spoke w/ TD Vitamin B12 is on back order everywhere and although we would love to help patient we have to have enough for our patients. Rec patient get rx from primary to take to pharmacy for injection.  Spoke with patient again informed him of the above and nothing further needed at this time.

## 2012-06-07 ENCOUNTER — Telehealth: Payer: Self-pay | Admitting: Gastroenterology

## 2012-06-07 NOTE — Telephone Encounter (Signed)
On call note @ 1125.  Pt has had diarrhea for 2 days. Took Augmentin for 1-2 days, felt poorly and had diarrhea so Augmentin was stopped 3 days ago. Diarrhea improved but it persists. Advised BRATT diet, push fluids, Imodium bid prn. Call office on Monday if diarrhea not resolved for further advice.

## 2012-06-16 ENCOUNTER — Telehealth: Payer: Self-pay | Admitting: Family Medicine

## 2012-06-16 NOTE — Telephone Encounter (Signed)
[facility] Triage Call Report Triage Record Num: [id number] Operator: Aundra Millet Patient Name: Shawn Gaines Call Date & Time: 06/14/2012 1:21:34AM Patient Phone: [phone] PCP: Tera Mater. Clent Ridges Patient Gender: Male PCP Fax : [phone] Patient DOB: 12/23/1940 Practice Name: [facility] Reason for Call: Caller: [place]ell/Patient; PCP: Gershon Crane [facility]); CB#: [phone]; Call regarding Pt was stung by a bee ; Today, 06/14/2012, pt calling stating this morning at 1200 midnight he was outside and was bit by some type of insect on his left forearm/ He thinks it was a bee, but he wasn't able to see it that well. There was a raised bump that appeared, but has decreased in size. Some pain at the site. No itching. RN reached home care advice for painful bites per Bites and stings Insects or Spiders Protocol . Care advice given Protocol(s) Used: Bites and Stings - Insects or Spiders Recommended Outcome per Protocol: Provide Home/Self Care Reason for Outcome: Painful bites Care Advice: Topical corticosteroid cream/gel may be used as directed on label or by pharmacist. DO NOT apply a dressing to the area. ~ Call EMS 911 if any of the following occur within 24 hours of bite/sting: loss of consciousness, sudden onset of difficulty breathing or wheezing, chest pain or tightness, throat tightness, severe swelling of parts of the body (e.g., eyes, lips, or tongue) other than bite/sting site, abdominal cramps. ~ ~ Wash area gently but thoroughly with warm soapy water. Apply cloth-covered ice pack or a cool compress to the area for no more than 20 minutes 4-8 times a day while awake to reduce pain and swelling. ~ Apply a baking soda paste made with 3 parts baking soda and 1 part water to the sting to relieve stinging. Repeat as necessary. ~ ~ SYMPTOM / CONDITION MANAGEMENT Go the the ED if any of the following symptoms develop: severe muscle cramping, abdominal pain,  worsening pain at the bite site, nausea, vomiting, headache. ~ 06/14/2012 1:33:42AM Page 1 of 1 [id number]

## 2012-06-19 ENCOUNTER — Telehealth: Payer: Self-pay | Admitting: Family Medicine

## 2012-06-19 NOTE — Telephone Encounter (Signed)
pls advise

## 2012-06-19 NOTE — Telephone Encounter (Signed)
Pt was contact by urologist and they requesting for him to follow up concerning his prostate issue he had 1 yr ago with them. Pt would like to know can Dr Clent Ridges handle  prostate issue in future.

## 2012-06-23 NOTE — Telephone Encounter (Signed)
No, he needs to follow up with Urology

## 2012-06-24 NOTE — Telephone Encounter (Signed)
I spoke with pt  

## 2012-06-30 ENCOUNTER — Ambulatory Visit (INDEPENDENT_AMBULATORY_CARE_PROVIDER_SITE_OTHER): Payer: Medicare Other | Admitting: Family Medicine

## 2012-06-30 ENCOUNTER — Encounter: Payer: Self-pay | Admitting: Family Medicine

## 2012-06-30 VITALS — BP 128/78 | HR 74 | Temp 97.4°F | Wt 184.0 lb

## 2012-06-30 DIAGNOSIS — E538 Deficiency of other specified B group vitamins: Secondary | ICD-10-CM

## 2012-06-30 DIAGNOSIS — R5381 Other malaise: Secondary | ICD-10-CM

## 2012-06-30 DIAGNOSIS — R5383 Other fatigue: Secondary | ICD-10-CM

## 2012-06-30 MED ORDER — CYANOCOBALAMIN 1000 MCG/ML IJ SOLN
1000.0000 ug | Freq: Once | INTRAMUSCULAR | Status: AC
  Administered 2012-06-30: 1000 ug via INTRAMUSCULAR

## 2012-06-30 NOTE — Addendum Note (Signed)
Addended by: Aniceto Boss A on: 06/30/2012 03:11 PM   Modules accepted: Orders

## 2012-06-30 NOTE — Progress Notes (Signed)
  Subjective:    Patient ID: Shawn Gaines, male    DOB: [birth year], 72 y.o.   MRN: [id number]  HPI Here to discuss generalized fatigue that has bothered him for several weeks. No SOB or chest pain. He had labs per Dr. Jarold Motto in February showing a low B12 level at 191. He was supposed to come in weekly for shots but he has only received 2 so far. His other labs were unremarkable.    Review of Systems  Constitutional: Positive for fatigue.  Respiratory: Negative.   Cardiovascular: Negative.   Neurological: Negative.        Objective:   Physical Exam  Constitutional: He is oriented to person, place, and time. He appears well-developed and well-nourished.  Cardiovascular: Normal rate, regular rhythm, normal heart sounds and intact distal pulses.   Pulmonary/Chest: Effort normal and breath sounds normal.  Neurological: He is alert and oriented to person, place, and time.          Assessment & Plan:  He is probably fatigued from his low B12. I encouraged him to come in weekly for the shots for 12 weeks, then we will check another level.

## 2012-07-08 ENCOUNTER — Ambulatory Visit (INDEPENDENT_AMBULATORY_CARE_PROVIDER_SITE_OTHER): Payer: Medicare Other | Admitting: Family Medicine

## 2012-07-08 DIAGNOSIS — E538 Deficiency of other specified B group vitamins: Secondary | ICD-10-CM

## 2012-07-08 MED ORDER — CYANOCOBALAMIN 1000 MCG/ML IJ SOLN
1000.0000 ug | Freq: Once | INTRAMUSCULAR | Status: AC
  Administered 2012-07-08: 1000 ug via INTRAMUSCULAR

## 2012-07-16 ENCOUNTER — Ambulatory Visit (INDEPENDENT_AMBULATORY_CARE_PROVIDER_SITE_OTHER): Payer: Medicare Other | Admitting: Family Medicine

## 2012-07-16 DIAGNOSIS — E538 Deficiency of other specified B group vitamins: Secondary | ICD-10-CM

## 2012-07-16 MED ORDER — CYANOCOBALAMIN 1000 MCG/ML IJ SOLN
1000.0000 ug | Freq: Once | INTRAMUSCULAR | Status: AC
  Administered 2012-07-16: 1000 ug via INTRAMUSCULAR

## 2012-08-12 ENCOUNTER — Ambulatory Visit: Payer: Medicare Other | Admitting: Family Medicine

## 2012-08-13 ENCOUNTER — Ambulatory Visit (INDEPENDENT_AMBULATORY_CARE_PROVIDER_SITE_OTHER): Payer: Medicare Other | Admitting: Family Medicine

## 2012-08-13 DIAGNOSIS — E538 Deficiency of other specified B group vitamins: Secondary | ICD-10-CM

## 2012-08-13 MED ORDER — CYANOCOBALAMIN 1000 MCG/ML IJ SOLN
1000.0000 ug | Freq: Once | INTRAMUSCULAR | Status: AC
  Administered 2012-08-13: 1000 ug via INTRAMUSCULAR

## 2012-09-12 ENCOUNTER — Ambulatory Visit: Payer: Medicare Other | Admitting: Family Medicine

## 2012-09-15 ENCOUNTER — Ambulatory Visit: Payer: Medicare Other | Admitting: Family Medicine

## 2012-09-16 ENCOUNTER — Ambulatory Visit (INDEPENDENT_AMBULATORY_CARE_PROVIDER_SITE_OTHER): Payer: Medicare Other | Admitting: Family Medicine

## 2012-09-16 DIAGNOSIS — E538 Deficiency of other specified B group vitamins: Secondary | ICD-10-CM

## 2012-09-16 MED ORDER — CYANOCOBALAMIN 1000 MCG/ML IJ SOLN
1000.0000 ug | Freq: Once | INTRAMUSCULAR | Status: AC
  Administered 2012-09-16: 1000 ug via INTRAMUSCULAR

## 2012-09-23 ENCOUNTER — Ambulatory Visit: Payer: Medicare Other | Admitting: Family Medicine

## 2012-09-24 ENCOUNTER — Ambulatory Visit (INDEPENDENT_AMBULATORY_CARE_PROVIDER_SITE_OTHER): Payer: Medicare Other | Admitting: Family Medicine

## 2012-09-24 DIAGNOSIS — E538 Deficiency of other specified B group vitamins: Secondary | ICD-10-CM

## 2012-09-24 MED ORDER — CYANOCOBALAMIN 1000 MCG/ML IJ SOLN
1000.0000 ug | Freq: Once | INTRAMUSCULAR | Status: AC
  Administered 2012-09-24: 1000 ug via INTRAMUSCULAR

## 2012-09-29 ENCOUNTER — Ambulatory Visit: Payer: Medicare Other | Admitting: Family Medicine

## 2012-09-30 ENCOUNTER — Ambulatory Visit (INDEPENDENT_AMBULATORY_CARE_PROVIDER_SITE_OTHER): Payer: Medicare Other | Admitting: Family Medicine

## 2012-09-30 DIAGNOSIS — E538 Deficiency of other specified B group vitamins: Secondary | ICD-10-CM

## 2012-09-30 MED ORDER — CYANOCOBALAMIN 1000 MCG/ML IJ SOLN
1000.0000 ug | Freq: Once | INTRAMUSCULAR | Status: AC
  Administered 2012-09-30: 1000 ug via INTRAMUSCULAR

## 2012-10-15 ENCOUNTER — Ambulatory Visit: Payer: Medicare Other | Admitting: Family Medicine

## 2012-10-16 ENCOUNTER — Ambulatory Visit: Payer: Medicare Other | Admitting: Family Medicine

## 2012-10-17 ENCOUNTER — Ambulatory Visit (INDEPENDENT_AMBULATORY_CARE_PROVIDER_SITE_OTHER): Payer: Medicare Other | Admitting: Family Medicine

## 2012-10-17 DIAGNOSIS — E538 Deficiency of other specified B group vitamins: Secondary | ICD-10-CM

## 2012-10-17 MED ORDER — CYANOCOBALAMIN 1000 MCG/ML IJ SOLN
1000.0000 ug | Freq: Once | INTRAMUSCULAR | Status: AC
  Administered 2012-10-17: 1000 ug via INTRAMUSCULAR

## 2012-10-22 ENCOUNTER — Ambulatory Visit: Payer: Medicare Other | Admitting: Family Medicine

## 2012-10-23 ENCOUNTER — Ambulatory Visit (INDEPENDENT_AMBULATORY_CARE_PROVIDER_SITE_OTHER): Payer: Medicare Other

## 2012-10-23 DIAGNOSIS — E538 Deficiency of other specified B group vitamins: Secondary | ICD-10-CM

## 2012-10-23 MED ORDER — CYANOCOBALAMIN 1000 MCG/ML IJ SOLN
1000.0000 ug | Freq: Once | INTRAMUSCULAR | Status: AC
  Administered 2012-10-23: 1000 ug via INTRAMUSCULAR

## 2012-10-24 ENCOUNTER — Telehealth: Payer: Self-pay

## 2012-10-24 ENCOUNTER — Telehealth: Payer: Self-pay | Admitting: Family Medicine

## 2012-10-24 NOTE — Telephone Encounter (Signed)
[facility] Triage Call Report Triage Record Num: [id number] Operator: Peri Jefferson Patient Name: Shawn Gaines Call Date & Time: 10/23/2012 11:48:52AM Patient Phone: [phone] PCP: Tera Mater. Clent Ridges Patient Gender: Male PCP Fax : [phone] Patient DOB: December 27, 1940 Practice Name: [facility] Reason for Call: Triage completed by Jeraldine Loots, RN on 10/22/12 at 6:00pm Caller: Bernard/Patient; PCP: Gershon Crane [facility]); CB#: [phone]; Calling to see if his weekly B12 injections could be causing constipation. Has not had a bm for 4 days. States that he takes Metamucil daily and it has been working great, normal soft stool until the past 4 days. No other sx. Per website, the B12 injections should cause diarrhea. He is going to take Epsom salts tonight. Has a scheduled appt. with Dr. Clent Ridges tomorrow and will discuss at that time. Protocol(s) Used: Constipation Recommended Outcome per Protocol: Provide Home/Self Care Reason for Outcome: All other situations Care Advice: ~ 08/

## 2012-10-24 NOTE — Telephone Encounter (Signed)
Pt came in for b12 shot and pt wanted to know if he would be charged for visit on 10/22/2012 that he could not make it to. No charge for patient due to appt being cancelled (spoke with [facility]).  Called and spoke with pt and pt is aware.

## 2012-12-02 ENCOUNTER — Ambulatory Visit (INDEPENDENT_AMBULATORY_CARE_PROVIDER_SITE_OTHER): Payer: Medicare Other | Admitting: Family Medicine

## 2012-12-02 DIAGNOSIS — E538 Deficiency of other specified B group vitamins: Secondary | ICD-10-CM

## 2012-12-02 MED ORDER — CYANOCOBALAMIN 1000 MCG/ML IJ SOLN
1000.0000 ug | Freq: Once | INTRAMUSCULAR | Status: AC
  Administered 2012-12-02: 1000 ug via INTRAMUSCULAR

## 2013-01-23 ENCOUNTER — Ambulatory Visit (INDEPENDENT_AMBULATORY_CARE_PROVIDER_SITE_OTHER): Payer: Medicare Other | Admitting: Family Medicine

## 2013-01-23 ENCOUNTER — Encounter: Payer: Self-pay | Admitting: Family Medicine

## 2013-01-23 ENCOUNTER — Other Ambulatory Visit: Payer: Self-pay | Admitting: Family Medicine

## 2013-01-23 ENCOUNTER — Ambulatory Visit: Payer: Medicare Other | Admitting: Family Medicine

## 2013-01-23 VITALS — BP 114/80 | HR 123 | Temp 97.3°F | Wt 185.0 lb

## 2013-01-23 DIAGNOSIS — E538 Deficiency of other specified B group vitamins: Secondary | ICD-10-CM

## 2013-01-23 DIAGNOSIS — F411 Generalized anxiety disorder: Secondary | ICD-10-CM

## 2013-01-23 MED ORDER — LORAZEPAM 1 MG PO TABS: 1.0000 mg | ORAL_TABLET | Freq: Four times a day (QID) | ORAL | Status: DC | PRN

## 2013-01-23 MED ORDER — CYANOCOBALAMIN 1000 MCG/ML IJ SOLN
1000.0000 ug | Freq: Once | INTRAMUSCULAR | Status: AC
  Administered 2013-01-23: 1000 ug via INTRAMUSCULAR
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
## 2013-01-23 NOTE — Progress Notes (Signed)
Pre visit review using our clinic review tool, if applicable. No additional management support is needed unless otherwise documented below in the visit note. 

## 2013-01-27 ENCOUNTER — Telehealth: Payer: Self-pay | Admitting: Family Medicine

## 2013-01-27 NOTE — Telephone Encounter (Signed)
Per Dr. Clent Ridges, pt should get the flu vaccine. I called and left a voice message for pt with this information.

## 2013-01-27 NOTE — Telephone Encounter (Signed)
Pt would like to know if you think its a good idea for him to get a flu shot. Pt has not had one in 10 yrs.

## 2013-02-05 ENCOUNTER — Ambulatory Visit: Payer: Medicare Other | Admitting: Family Medicine

## 2013-02-06 ENCOUNTER — Encounter: Payer: Self-pay | Admitting: *Deleted

## 2013-02-09 ENCOUNTER — Encounter: Payer: Medicare Other | Admitting: Family Medicine

## 2013-02-11 ENCOUNTER — Encounter: Payer: Self-pay | Admitting: Family Medicine

## 2013-02-11 ENCOUNTER — Encounter: Payer: Medicare Other | Admitting: Family Medicine

## 2013-02-12 ENCOUNTER — Ambulatory Visit (INDEPENDENT_AMBULATORY_CARE_PROVIDER_SITE_OTHER): Payer: Medicare Other | Admitting: Family Medicine

## 2013-02-12 ENCOUNTER — Ambulatory Visit: Payer: Medicare Other | Admitting: Family Medicine

## 2013-02-12 ENCOUNTER — Telehealth: Payer: Self-pay | Admitting: Family Medicine

## 2013-02-12 DIAGNOSIS — E538 Deficiency of other specified B group vitamins: Secondary | ICD-10-CM

## 2013-02-12 MED ORDER — CYANOCOBALAMIN 1000 MCG/ML IJ SOLN
1000.0000 ug | Freq: Once | INTRAMUSCULAR | Status: AC
  Administered 2013-02-12: 1000 ug via INTRAMUSCULAR

## 2013-02-12 NOTE — Telephone Encounter (Addendum)
Patient dismissed from [facility] by Gershon Crane MD , effective February 11, 2013. Dismissal letter sent out by certified / registered mail. DAJ  Received signed domestic return receipt verifying delivery of certified letter on February 19, 2013. Article number [id number] DAJ

## 2013-02-13 ENCOUNTER — Ambulatory Visit: Payer: Medicare Other | Admitting: Family Medicine

## 2013-02-13 NOTE — Progress Notes (Signed)
   Subjective:    Patient ID: Shawn Gaines, male    DOB: 01/27/1941, 72 y.o.   MRN: [id number]  HPI    Review of Systems     Objective:   Physical Exam        Assessment & Plan:  He No Showed and was then discharged from the practice

## 2013-02-20 ENCOUNTER — Telehealth: Payer: Self-pay | Admitting: *Deleted

## 2013-02-20 ENCOUNTER — Encounter: Payer: Self-pay | Admitting: Gastroenterology

## 2013-02-20 ENCOUNTER — Ambulatory Visit (INDEPENDENT_AMBULATORY_CARE_PROVIDER_SITE_OTHER): Payer: Medicare Other | Admitting: Gastroenterology

## 2013-02-20 VITALS — BP 132/84 | HR 64 | Ht 70.0 in | Wt 185.0 lb

## 2013-02-20 DIAGNOSIS — K5900 Constipation, unspecified: Secondary | ICD-10-CM

## 2013-02-20 MED ORDER — LINACLOTIDE 145 MCG PO CAPS: 145.0000 ug | ORAL_CAPSULE | Freq: Every day | ORAL | Status: DC

## 2013-02-20 NOTE — Progress Notes (Signed)
This is a 72 year old Caucasian male has had 10-12 months of constipation with incomplete rectal emptying.  He denies abdominal rectal pain, melena or hematochezia.  He does not eat much fiber and takes little by mouth fluids.  Is no history of melena, hematochezia, and had negative colonoscopy in any way although this was a poor exam.  Review of his labs shows normal thyroid function tests.  He's had no anorexia, weight loss, or other systemic complaints.  Family history is noncontributory.  Current Medications, Allergies, Past Medical History, Past Surgical History, Family History and Social History were reviewed in [employer] record.  ROS: All systems were reviewed and are negative unless otherwise stated in the HPI.          Physical Exam: Blood pressure 132/84, pulse 84 and regular weight 185 with BMI 26.54.  I cannot appreciate stigmata of chronic liver disease.  His abdomen is soft and nontender without distention.  There is no organomegaly, masses or tenderness.  Bowel sounds are normal.  Rectal exam shows a large volume of stool in the rectal vault which is not impacted, normal color, and guaiac-negative.  Rectal tone and squeeze pressure appears normal.    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  I have asked him to use Benefiber 1 tablespoon twice a day with his meals, to increase his by mouth fluids with Gatorade, juice, and water, also if he needs to use additional meds to try MiraLax at bedtime and also if needed Linzess 145 mcg a day.  Is not on narcotics or any other medications that should cause constipation.  Much of  his constipation is related to poor by mouth liquid intake  because he is incontinent of urine and apparently is under urology care.  I've offered repeat urology appointment but he declined.  CC: Dr Gershon Crane

## 2013-02-20 NOTE — Patient Instructions (Addendum)
Please purchase Benefiber over the counter and take twice daily.  You can sprinkle Benefiber in your food.  Please purchase Miralax over the counter and mix one capful in 8 ounces of water or juice to drink at bedtime. Drink a glass of Miralax when you get home today.  We have given you samples of the following medication to take: Linzess 145 mcg, please take one capsule by mouth once daily on an empty stomach  Information on Constipation is below for your review.  _____________________________________________________________________________________________________________________  Constipation, Adult Constipation is when a person has fewer than 3 bowel movements a week; has difficulty having a bowel movement; or has stools that are dry, hard, or larger than normal. As people grow older, constipation is more common. If you try to fix constipation with medicines that make you have a bowel movement (laxatives), the problem may get worse. Long-term laxative use may cause the muscles of the colon to become weak. A low-fiber diet, not taking in enough fluids, and taking certain medicines may make constipation worse. CAUSES   Certain medicines, such as antidepressants, pain medicine, iron supplements, antacids, and water pills.   Certain diseases, such as diabetes, irritable bowel syndrome (IBS), thyroid disease, or depression.   Not drinking enough water.   Not eating enough fiber-rich foods.   Stress or travel.  Lack of physical activity or exercise.  Not going to the restroom when there is the urge to have a bowel movement.  Ignoring the urge to have a bowel movement.  Using laxatives too much. SYMPTOMS   Having fewer than 3 bowel movements a week.   Straining to have a bowel movement.   Having hard, dry, or larger than normal stools.   Feeling full or bloated.   Pain in the lower abdomen.  Not feeling relief after having a bowel movement. DIAGNOSIS  Your  caregiver will take a medical history and perform a physical exam. Further testing may be done for severe constipation. Some tests may include:   A barium enema X-Benjaman to examine your rectum, colon, and sometimes, your small intestine.  A sigmoidoscopy to examine your lower colon.  A colonoscopy to examine your entire colon. TREATMENT  Treatment will depend on the severity of your constipation and what is causing it. Some dietary treatments include drinking more fluids and eating more fiber-rich foods. Lifestyle treatments may include regular exercise. If these diet and lifestyle recommendations do not help, your caregiver may recommend taking over-the-counter laxative medicines to help you have bowel movements. Prescription medicines may be prescribed if over-the-counter medicines do not work.  HOME CARE INSTRUCTIONS   Increase dietary fiber in your diet, such as fruits, vegetables, whole grains, and beans. Limit high-fat and processed sugars in your diet, such as [place] fries, hamburgers, cookies, candies, and soda.   A fiber supplement may be added to your diet if you cannot get enough fiber from foods.   Drink enough fluids to keep your urine clear or pale yellow.   Exercise regularly or as directed by your caregiver.   Go to the restroom when you have the urge to go. Do not hold it.  Only take medicines as directed by your caregiver. Do not take other medicines for constipation without talking to your caregiver first. SEEK IMMEDIATE MEDICAL CARE IF:   You have bright red blood in your stool.   Your constipation lasts for more than 4 days or gets worse.   You have abdominal or rectal pain.   You  have thin, pencil-like stools.  You have unexplained weight loss. MAKE SURE YOU:   Understand these instructions.  Will watch your condition.  Will get help right away if you are not doing well or get worse. Document Released: 11/18/2003 Document Revised: 05/14/2011 Document  Reviewed: 01/23/2011 [facility] Patient Information 2014 [place], [place].

## 2013-02-20 NOTE — Telephone Encounter (Signed)
Pt reports he was given Linzess , but he forget what it was for. Read pt back Dr Norval Gable orders for his constipation and the Linzess is the last med to try; pt stated understanding.

## 2013-03-10 ENCOUNTER — Telehealth: Payer: Self-pay | Admitting: Internal Medicine

## 2013-03-10 ENCOUNTER — Emergency Department (HOSPITAL_COMMUNITY): Payer: Medicare HMO

## 2013-03-10 ENCOUNTER — Emergency Department (HOSPITAL_COMMUNITY)
Admission: EM | Admit: 2013-03-10 | Discharge: 2013-03-10 | Disposition: A | Discharge status: Home / Self Care | Payer: Medicare HMO | Attending: Emergency Medicine | Admitting: Emergency Medicine

## 2013-03-10 ENCOUNTER — Encounter (HOSPITAL_COMMUNITY): Payer: Self-pay | Admitting: Emergency Medicine

## 2013-03-10 DIAGNOSIS — M1710 Unilateral primary osteoarthritis, unspecified knee: Secondary | ICD-10-CM | POA: Insufficient documentation

## 2013-03-10 DIAGNOSIS — Z7982 Long term (current) use of aspirin: Secondary | ICD-10-CM | POA: Insufficient documentation

## 2013-03-10 DIAGNOSIS — R0989 Other specified symptoms and signs involving the circulatory and respiratory systems: Principal | ICD-10-CM | POA: Insufficient documentation

## 2013-03-10 DIAGNOSIS — N400 Enlarged prostate without lower urinary tract symptoms: Secondary | ICD-10-CM | POA: Insufficient documentation

## 2013-03-10 DIAGNOSIS — IMO0002 Reserved for concepts with insufficient information to code with codable children: Secondary | ICD-10-CM | POA: Insufficient documentation

## 2013-03-10 DIAGNOSIS — Z87891 Personal history of nicotine dependence: Secondary | ICD-10-CM | POA: Insufficient documentation

## 2013-03-10 DIAGNOSIS — R0609 Other forms of dyspnea: Secondary | ICD-10-CM | POA: Insufficient documentation

## 2013-03-10 DIAGNOSIS — Z85828 Personal history of other malignant neoplasm of skin: Secondary | ICD-10-CM | POA: Insufficient documentation

## 2013-03-10 DIAGNOSIS — F411 Generalized anxiety disorder: Secondary | ICD-10-CM | POA: Insufficient documentation

## 2013-03-10 DIAGNOSIS — Z8739 Personal history of other diseases of the musculoskeletal system and connective tissue: Secondary | ICD-10-CM | POA: Insufficient documentation

## 2013-03-10 DIAGNOSIS — R0600 Dyspnea, unspecified: Secondary | ICD-10-CM

## 2013-03-10 DIAGNOSIS — R42 Dizziness and giddiness: Secondary | ICD-10-CM | POA: Insufficient documentation

## 2013-03-10 DIAGNOSIS — Z860100 Personal history of colon polyps, unspecified: Secondary | ICD-10-CM | POA: Insufficient documentation

## 2013-03-10 DIAGNOSIS — Z79899 Other long term (current) drug therapy: Secondary | ICD-10-CM | POA: Insufficient documentation

## 2013-03-10 DIAGNOSIS — K5900 Constipation, unspecified: Secondary | ICD-10-CM | POA: Insufficient documentation

## 2013-03-10 LAB — BASIC METABOLIC PANEL
BUN: 10 mg/dL (ref 6–23)
CO2: 20 mEq/L (ref 19–32)
Calcium: 8.8 mg/dL (ref 8.4–10.5)
Chloride: 95 mEq/L — ABNORMAL LOW (ref 96–112)
Creatinine, Ser: 0.74 mg/dL (ref 0.50–1.35)
GFR, EST NON AFRICAN AMERICAN: 89 mL/min — AB (ref >90)
Glucose, Bld: 99 mg/dL (ref 70–99)
Potassium: 4 mEq/L (ref 3.7–5.3)
SODIUM: 131 meq/L — AB (ref 137–147)

## 2013-03-10 LAB — CBC
HCT: 38.3 % — ABNORMAL LOW (ref 39.0–52.0)
HEMOGLOBIN: 13.6 g/dL (ref 13.0–17.0)
MCH: 32.5 pg (ref 26.0–34.0)
MCHC: 35.5 g/dL (ref 30.0–36.0)
MCV: 91.6 fL (ref 78.0–100.0)
PLATELETS: 245 10*3/uL (ref 150–400)
RBC: 4.18 MIL/uL — ABNORMAL LOW (ref 4.22–5.81)
RDW: 12.1 % (ref 11.5–15.5)
WBC: 3.9 10*3/uL — ABNORMAL LOW (ref 4.0–10.5)

## 2013-03-10 LAB — POCT I-STAT TROPONIN I: TROPONIN I, POC: 0.01 ng/mL (ref 0.00–0.08)

## 2013-03-10 LAB — PRO B NATRIURETIC PEPTIDE: Pro B Natriuretic peptide (BNP): 167.5 pg/mL — ABNORMAL HIGH (ref 0–125)

## 2013-03-10 MED ORDER — ASPIRIN 81 MG PO CHEW: 81.0000 mg | CHEWABLE_TABLET | Freq: Every day | ORAL | Status: DC

## 2013-03-10 NOTE — Telephone Encounter (Signed)
Patient Information:  Caller Name: [place]  Phone: [phone]  Patient: Shawn Gaines, Shawn Gaines  Gender: Male  DOB: [birth year]  Age: 73 Years  PCP: Shanon Ace [facility])  Office Follow Up:  Does the office need to follow up with this patient?: No  Spoke with Irine Seal about this pt.  He is going to the [facility] ED.   Instructions For The Office: N/A   Symptoms  Reason For Call & Symptoms: Pt is haivng trouble breathing, lightheadedness, weakness, SOB. SX started about 1400 today.  HR is 68 but pt speaking in phrases and remians SOB.  Reviewed Health History In EMR: Yes  Reviewed Medications In EMR: Yes  Reviewed Allergies In EMR: Yes  Reviewed Surgeries / Procedures: Yes  Date of Onset of Symptoms: 03/10/2013  Guideline(s) Used:  Breathing Difficulty  Disposition Per Guideline:   Go to ED Now  Reason For Disposition Reached:   Moderate difficulty breathing (e.g., speaks in phrases, SOB even at rest, pulse 100-120) of new onset or worse than normal  Advice Given:  N/A  Patient Will Follow Care Advice:  YES

## 2013-03-10 NOTE — ED Provider Notes (Signed)
CSN: [id number]     Arrival date & time 03/10/13  1607 History   First MD Initiated Contact with Patient 03/10/13 1720     Chief Complaint  Patient presents with  . Shortness of Breath   (Consider location/radiation/quality/duration/timing/severity/associated sxs/prior Treatment) HPI Comments: Shawn Gaines is a 73 year-old male with a past medical history of Chronic cough, BPH, anxiety, presenting the Emergency Department with a chief complaint of two episodes of dyspnea.  The patient reports 2 episodes of dyspnea that occurred while sleeping lasted approximately 30 minutes. He reports an episode today around 1500, and the initial episode 3 days ago. The patient reports both times he has been sleeping and woke up because of the dyspnea.  He reports walking around help relieve the dyspnea.  He denies chest pain or tightness.  He denies pleuritic chest discomfort.  He also reports associated lightheadedness during these episodes.  He denies increase in cough from baseline chronic cough, fever or chills.  Reports constipation which has been partially relieved by OTC medication.  Chronic cough, is not an an Ace inhibitor.  Stress test over 10 years ago.  Pulmonologist: Kathee Delton, MD   The history is provided by the patient. No language interpreter was used.    Past Medical History  Diagnosis Date  . Prostatic hypertrophy   . Cancer     skin cancer- R Buttocks-bx was postive - pt not sure what kind of cancer- for removeal in March  . Arthritis     Knee  . Rotator cuff disorder     BIL  . Colon polyps     TWO HYPERPLASTIC POLYPS   Past Surgical History  Procedure Laterality Date  . Tonsillectomy    . Knee arthroplasty  2009    right  . Joint replacement  2009    R Knee  . Total knee arthroplasty  05/04/2011    Procedure: TOTAL KNEE ARTHROPLASTY;  Surgeon: Kerin Salen, MD;  Location: [facility];  Service: Orthopedics;  Laterality: Left;  DEPUY  . Colonoscopy  2014    normal   Family  History  Problem Relation Age of Onset  . Anesthesia problems Neg Hx   . Colon cancer Neg Hx    History  Substance Use Topics  . Smoking status: Former Smoker -- 5 years    Types: Cigarettes, Pipe    Quit date: 05/04/2006  . Smokeless tobacco: Never Used     Comment: Cigs in teens--occassional "tried"---Smoked a pipe x 4-5 years.  . Alcohol Use: 1.5 oz/week    3 drink(s) per week    Review of Systems  Constitutional: Negative for fever and chills.  Respiratory: Positive for cough and shortness of breath. Negative for chest tightness, wheezing and stridor.   Cardiovascular: Negative for chest pain, palpitations and leg swelling.  Gastrointestinal: Positive for constipation. Negative for nausea, vomiting, abdominal pain and diarrhea.  Neurological: Positive for light-headedness. Negative for syncope, numbness and headaches.    Allergies  Doxycycline  Home Medications   Current Outpatient Rx  Name  Route  Sig  Dispense  Refill  . finasteride (PROSCAR) 5 MG tablet   Oral   Take 5 mg by mouth daily.          . fluticasone (FLONASE) 50 MCG/ACT nasal spray   Nasal   Place 2 sprays into the nose daily.         Marland Kitchen LORazepam (ATIVAN) 1 MG tablet   Oral   Take 1  tablet (1 mg total) by mouth every 6 (six) hours as needed for anxiety.   60 tablet   2   . omeprazole (PRILOSEC) 40 MG capsule   Oral   Take 40 mg by mouth daily.          . polyethylene glycol (MIRALAX / GLYCOLAX) packet   Oral   Take 17 g by mouth daily.         . psyllium (METAMUCIL) 58.6 % powder   Oral   Take 2 packets by mouth daily.         . Tamsulosin HCl (FLOMAX) 0.4 MG CAPS   Oral   Take 0.4 mg by mouth daily.         Marland Kitchen aspirin (ASPIRIN CHILDRENS) 81 MG chewable tablet   Oral   Chew 1 tablet (81 mg total) by mouth daily.   30 tablet   0    BP 137/92  Pulse 69  Temp(Src) 98.7 F (37.1 C) (Oral)  Resp 18  SpO2 99% Physical Exam  Nursing note and vitals  reviewed. Constitutional: He is oriented to person, place, and time. He appears well-developed and well-nourished. No distress.  HENT:  Head: Normocephalic and atraumatic.  Eyes: EOM are normal. Pupils are equal, round, and reactive to light.  Neck: Normal range of motion. Neck supple.  Cardiovascular: Normal rate.  An irregular rhythm present.  No murmur heard. No lower extremity edema.  Pulmonary/Chest: Effort normal and breath sounds normal. No accessory muscle usage. Not tachypneic. No respiratory distress. He has no decreased breath sounds. He has no wheezes. He has no rhonchi. He has no rales.  Patient is able to speak in complete sentences.   Abdominal: Soft. There is no tenderness. There is no rebound and no guarding.  Neurological: He is alert and oriented to person, place, and time.  Skin: Skin is warm and dry. He is not diaphoretic.    ED Course  Procedures (including critical care time) Labs Review Labs Reviewed  BASIC METABOLIC PANEL - Abnormal; Notable for the following:    Sodium 131 (*)    Chloride 95 (*)    GFR calc non Af Amer 89 (*)    All other components within normal limits  CBC - Abnormal; Notable for the following:    WBC 3.9 (*)    RBC 4.18 (*)    HCT 38.3 (*)    All other components within normal limits  PRO B NATRIURETIC PEPTIDE - Abnormal; Notable for the following:    Pro B Natriuretic peptide (BNP) 167.5 (*)    All other components within normal limits  POCT I-STAT TROPONIN I   Imaging Review Dg Chest 2 View (if Patient Has Fever And/or Copd)  03/10/2013   CLINICAL DATA:  Shortness of breath and dizziness.  EXAM: CHEST  2 VIEW  COMPARISON:  05/24/2012.  FINDINGS: The cardiac silhouette, mediastinal and hilar contours are within normal limits and stable. There is tortuosity of the thoracic aorta. There are mild chronic lung changes and hyperinflation but no acute pulmonary findings. No pleural effusion. The bony thorax is intact.  IMPRESSION: No acute  cardiopulmonary findings.   Electronically Signed   By: Kalman Jewels M.D.   On: 03/10/2013 17:19    EKG Interpretation    Date/Time:  Tuesday March 10 2013 16:34:59 EST Ventricular Rate:  72 PR Interval:    QRS Duration: 90 QT Interval:  385 QTC Calculation: 421 R Axis:   -7 Text Interpretation:  Anteroseptal  infarct, old No significant change since last tracing Confirmed by [place]  MD, [place] [phone]) on 03/10/2013 5:22:20 PM            MDM   1. Dyspnea    Pt reports two episodes of dyspnea today and 3 days ago, asymptomatic in the ED.  XR without pneumonia or pleural effusion, or pulmonary edema.  Possible cardiac related, given last stress test was over 10 years ago and PVC on exam, will have the patient follow up with cardiology.  Will advise the patient to start taking ASA 81 mg daily. Discussed patient history, condition, and labs with Dr. Tawnya Crook who agrees the patient can be evaluated as an out-pt.  Discussed patient history and condition with Cardiology who agrees to evaluate the patient in clinic. Discussed lab results, imaging results, and treatment plan with the patient. Return precautions given. Reports understanding and no other concerns at this time.  Patient is stable for discharge at this time.  Meds given in ED:  Medications - No data to display  Discharge Medication List as of 03/10/2013  7:57 PM    START taking these medications   Details  aspirin (ASPIRIN CHILDRENS) 81 MG chewable tablet Chew 1 tablet (81 mg total) by mouth daily., Starting 03/10/2013, Until Discontinued, Print             Lorrine Kin, PA-C 03/11/13 [phone]

## 2013-03-10 NOTE — Discharge Instructions (Signed)
Call for a follow up appointment with a Family or Primary Care Provider.  Call Dr. Francesca Oman office for an out patient stress test, and further work-up of your chest discomfort.  Return if Symptoms worsen.   Take medication as prescribed.

## 2013-03-10 NOTE — ED Notes (Addendum)
PT c/o SOB, has been having hacking cough and seeing and ENT doctor for that. This SOB started 3 days ago, cleared up then around 1500 pt was napping, got lightheaded when getting up and became short of breath. Had hx of COPD but states that cleared up.

## 2013-03-10 NOTE — Telephone Encounter (Signed)
Jayton calling on emergent line regarding cough, congestion, shortness of breath.  Royale is currently on the phone with another nurse for triage. Call already being handled.

## 2013-03-10 NOTE — ED Notes (Signed)
Pt c/o dyspnea at rest x 1 episode today, intermittent tingling in bilateral feet, and constipated x 3 days.

## 2013-03-12 NOTE — ED Provider Notes (Signed)
Medical screening examination/treatment/procedure(s) were performed by non-physician practitioner and as supervising physician I was immediately available for consultation/collaboration.  Pt presents after two episodes of SOB at home.  No CP, no current SOB. Pt well-appearing, in NAD.  CXR, EKG unremarkable.  Given age, will have him f/u as outpt for stress testing, although symptoms atypical for ACS.  Also doubt pna, PE as pt currently asymptomatic.   EKG Interpretation    Date/Time:  Tuesday March 10 2013 16:34:59 EST Ventricular Rate:  72 PR Interval:    QRS Duration: 90 QT Interval:  385 QTC Calculation: 421 R Axis:   -7 Text Interpretation:  Anteroseptal infarct, old No significant change since last tracing Confirmed by Mar[place]  MD, [place] [phone]) on 03/10/2013 5:22:20 PM              Neta Ehlers, MD 03/12/13 8032

## 2013-03-13 ENCOUNTER — Telehealth: Payer: Self-pay | Admitting: Cardiology

## 2013-03-13 ENCOUNTER — Telehealth (HOSPITAL_COMMUNITY): Payer: Self-pay

## 2013-03-13 NOTE — Telephone Encounter (Signed)
New message      Pt states he is fine at this present moment but needs to see dr Meda Coffee sooner. Pt states he needs a stress test. (It is hard to understand him)

## 2013-03-13 NOTE — ED Notes (Signed)
Pt calling called Dr Francesca Oman office to schedule Stress test and was told he needed ED to schedule.  I called office and read pt to be seen by Dr Meda Coffee i scheduled an appt for pt at her earliest time avi.  I called and informed pt appt w/Dr Meda Coffee Jan 22 @ 2:30.  Address provided.

## 2013-03-16 NOTE — Telephone Encounter (Signed)
No answer and no way to leave message. Will continue to call.

## 2013-03-18 ENCOUNTER — Encounter: Payer: Self-pay | Admitting: Cardiology

## 2013-03-18 ENCOUNTER — Ambulatory Visit (INDEPENDENT_AMBULATORY_CARE_PROVIDER_SITE_OTHER): Payer: Medicare HMO | Admitting: Cardiology

## 2013-03-18 VITALS — BP 139/84 | HR 97 | Ht 72.0 in | Wt 182.0 lb

## 2013-03-18 DIAGNOSIS — R0789 Other chest pain: Secondary | ICD-10-CM

## 2013-03-18 DIAGNOSIS — R9431 Abnormal electrocardiogram [ECG] [EKG]: Secondary | ICD-10-CM

## 2013-03-18 NOTE — Patient Instructions (Signed)
Your physician recommends that you continue on your current medications as directed. Please refer to the Current Medication list given to you today.  Follow up as needed  Your physician has requested that you have en exercise stress myoview. For further information please visit [email or web address]. Please follow instruction sheet, as given.

## 2013-03-18 NOTE — Progress Notes (Signed)
Shawn Gaines Date of Birth:  08-10-1940 [address] [place], [place]  [address] [phone]         Fax   [phone]  History of Present Illness: This pleasant 73 year old gentleman is seen in the office for the first time today in cardiology evaluation.  He was sent over from [facility] long emergency room for consideration of exercise stress testing.  He was seen in the [facility] long emergency room on 03/10/13.  He went there after experiencing severe shortness of breath at home.  The patient states that he has been vigorously treated for COPD.  The patient does not have any history of known ischemic heart disease.  Today he states that he awoke from a nap and felt like an elephant was sitting on his chest.  However after he got up and started around no symptoms resolved entirely.  His electrocardiogram from [facility] room showed poor R-wave progression in V1 and V2 suggesting possible old anteroseptal myocardial infarction.  Repeat EKG today shows normal progression and no ischemic changes although he now has occasional PVCs which are new.  The patient does not have a family history of premature coronary disease.  This parents died in their 23s.  The patient is a former pipe smoker.  He has never smoked cigarettes. The patient has multiple other somatic complaints including constipation for which he takes MiraLax.  He complains of dry mouth.  He has had back pain.  He's had bilateral knee replacements which are functioning well.  He has had some recent easy fatigue and malaise.  Current Outpatient Prescriptions  Medication Sig Dispense Refill  . aspirin (ASPIRIN CHILDRENS) 81 MG chewable tablet Chew 1 tablet (81 mg total) by mouth daily.  30 tablet  0  . Corn Dextrin (EASY FIBER PO) Take by mouth. Two teaspoon s into food or bevearage      . finasteride (PROSCAR) 5 MG tablet Take 5 mg by mouth daily.       . fluticasone (FLONASE) 50 MCG/ACT nasal spray Place 2 sprays into  the nose daily.      Marland Kitchen omeprazole (PRILOSEC) 40 MG capsule Take 40 mg by mouth daily.       . Polyethylene Glycol 3350 (EQ CLEARLAX PO) Take by mouth. 1 capful at night      . Tamsulosin HCl (FLOMAX) 0.4 MG CAPS Take 0.4 mg by mouth daily.       No current facility-administered medications for this visit.    Allergies  Allergen Reactions  . Doxycycline     Loss of balance & fatigue    Patient Active Problem List   Diagnosis Date Noted  . Cough 05/02/2012  . Osteoarthritis of left knee 05/04/2011  . ETHMOID SINUSITIS 01/26/2009  . WEAKNESS 01/26/2009  . TINEA CRURIS 10/08/2008  . HYDROCELE 08/18/2008  . LACERATION, FOOT 08/18/2008  . ACUTE SINUSITIS, UNSPECIFIED 02/26/2008  . B12 DEFICIENCY 06/27/2007  . UNSPECIFIED VITAMIN D DEFICIENCY 06/27/2007  . OSTEOARTHRITIS 03/25/2007  . DYSURIA 03/24/2007  . ANXIETY 10/07/2006  . TOE PAIN 10/07/2006  . BENIGN PROSTATIC HYPERTROPHY, HX OF 10/07/2006    History  Smoking status  . Former Smoker -- 5 years  . Types: Cigarettes, Pipe  . Quit date: 05/04/2006  Smokeless tobacco  . Never Used    Comment: Cigs in teens--occassional "tried"---Smoked a pipe x 4-5 years.    History  Alcohol Use  . 1.5 oz/week  . 3 drink(s) per week  Family History  Problem Relation Age of Onset  . Anesthesia problems Neg Hx   . Colon cancer Neg Hx     Review of Systems: Constitutional: no fever chills diaphoresis or fatigue or change in weight.  Head and neck: no hearing loss, no epistaxis, no photophobia or visual disturbance. Respiratory: Positive for shortness of breath Cardiovascular: Positive for intermittent chest tightness at rest. Gastrointestinal: No abdominal distention, no abdominal pain, no change in bowel habits hematochezia or melena. Genitourinary: No dysuria, no frequency, no urgency, no nocturia. Musculoskeletal:No arthralgias, no back pain, no gait disturbance or myalgias. Neurological: No dizziness, no headaches, no  numbness, no seizures, no syncope, no weakness, no tremors. Hematologic: No lymphadenopathy, no easy bruising. Psychiatric: No confusion, no hallucinations, no sleep disturbance.    Physical Exam: Filed Vitals:   03/18/13 1422  BP: 139/84  Pulse: 97   the general appearance reveals a well-developed well-nourished gentleman in no distress.The head and neck exam reveals pupils equal and reactive.  Extraocular movements are full.  There is no scleral icterus.  The mouth and pharynx are normal.  The neck is supple.  The carotids reveal no bruits.  The jugular venous pressure is normal.  The  thyroid is not enlarged.  There is no lymphadenopathy.  The chest is clear to percussion and auscultation.  There are no rales or rhonchi.  Expansion of the chest is symmetrical.  The precordium is quiet.  The first heart sound is normal.  The second heart sound is physiologically split.  There is no murmur gallop rub or click.  There is no abnormal lift or heave.  The abdomen is soft and nontender.  The bowel sounds are normal.  The liver and spleen are not enlarged.  There are no abdominal masses.  There are no abdominal bruits.  Extremities reveal good pedal pulses.  There is no phlebitis or edema.  There is no cyanosis or clubbing.  Strength is normal and symmetrical in all extremities.  There is no lateralizing weakness.  There are no sensory deficits.  The skin is warm and dry.  There is no rash.  EKG today shows sinus rhythm with occasional PVCs.  No ischemic changes.   Assessment / Plan: The patient has been experiencing unexplained shortness of breath, easy fatigue, and intermittent chest pressure.  His electrocardiogram shows occasional PVCs but no ischemic changes.  We will have him return soon for a treadmill Myoview stress test.

## 2013-03-19 ENCOUNTER — Ambulatory Visit (INDEPENDENT_AMBULATORY_CARE_PROVIDER_SITE_OTHER): Payer: Medicare HMO | Admitting: Pulmonary Disease

## 2013-03-19 ENCOUNTER — Encounter: Payer: Self-pay | Admitting: Pulmonary Disease

## 2013-03-19 VITALS — BP 122/86 | HR 67 | Temp 97.4°F | Ht 72.0 in | Wt 185.0 lb

## 2013-03-19 DIAGNOSIS — R0600 Dyspnea, unspecified: Secondary | ICD-10-CM | POA: Insufficient documentation

## 2013-03-19 DIAGNOSIS — R0601 Orthopnea: Secondary | ICD-10-CM

## 2013-03-19 NOTE — Assessment & Plan Note (Signed)
The patient is noting new onset dyspnea while sleeping during the night and naps during the day. He has had a recent chest x-Clearance that was unremarkable, has totally clear lungs today on exam, and his spirometry today is totally normal as well. He is scheduled for a cardiac evaluation, and if this is unremarkable, would consider whether he may have sleep disordered breathing. He does note some fatigue during the day as well as sleepiness. This can also cause episodic nocturnal dyspnea. He is to call us if he has a negative cardiac workup so that we can arrange for a sleep study for further evaluation.

## 2013-03-19 NOTE — Progress Notes (Signed)
   Subjective:    Patient ID: Shawn Gaines, male    DOB: [birth year], 73 y.o.   MRN: [id number]  HPI The patient comes in today for an acute visit. I saw him last spring for a chronic cough that resolved quite nicely with treatment of postnasal drip. He was in his usual state of health until most recently when he began to have episodes of shortness of breath during sleep. This occurred during the night and also during in the daytime. The patient states that he would awaken with a feeling of breathlessness, and would take a period of time to settle down. He is been seen in the emergency room with normal sats and a negative chest x-Ronn. The patient has no history of asthma, and had normal spirometry in April of last year in this office. He denies having any dyspnea on exertion during the day with his activities of daily living. He has been set up with a cardiologist for further evaluation as well. The patient is not morbidly obese and does not have a history of prior sleep apnea. He is unsure if he snores, but does admit to having frequent awakenings at night and nonrestorative sleep. He does have sleepiness during the day with inactivity, although it is not severe by his history.   Review of Systems  Constitutional: Negative for fever and unexpected weight change.  HENT: Negative for congestion, dental problem, ear pain, nosebleeds, postnasal drip, rhinorrhea, sinus pressure, sneezing, sore throat and trouble swallowing.   Eyes: Negative for redness and itching.  Respiratory: Positive for cough and shortness of breath. Negative for chest tightness and wheezing.   Cardiovascular: Negative for palpitations and leg swelling.  Gastrointestinal: Negative for nausea and vomiting.  Genitourinary: Negative for dysuria.  Musculoskeletal: Negative for joint swelling.  Skin: Negative for rash.  Neurological: Negative for headaches.  Hematological: Does not bruise/bleed easily.  Psychiatric/Behavioral: Negative  for dysphoric mood. The patient is not nervous/anxious.        Objective:   Physical Exam Well-developed male in no acute distress Nose without purulence or discharge noted Oropharynx clear Neck without lymphadenopathy or thyromegaly Chest totally clear to auscultation, no wheezing Cardiac exam with regular rate and rhythm Lower extremities with mild right ankle edema, no cyanosis Alert and oriented, moves all 4 extremities.       Assessment & Plan:

## 2013-03-19 NOTE — Patient Instructions (Signed)
Make sure you keep your apptm with the heart doctor for further evaluation.  If nothing is found from a cardiac standpoint, and you continue to have these episodes during sleep, would schedule for sleep study to evaluate for sleep apnea.  Please let us know.

## 2013-03-20 NOTE — Telephone Encounter (Signed)
**Note De-Identified  Obfuscation** The pt states that he sees a number of physicians and that this matter has been resolved. Pt is advised to call us back if he has any questions or concerns, he verbalized understanding.

## 2013-03-24 ENCOUNTER — Telehealth: Payer: Self-pay | Admitting: Gastroenterology

## 2013-03-24 ENCOUNTER — Encounter (HOSPITAL_COMMUNITY): Payer: Medicare HMO

## 2013-03-25 MED ORDER — LINACLOTIDE 145 MCG PO CAPS: 145.0000 ug | ORAL_CAPSULE | Freq: Every day | ORAL | Status: DC

## 2013-03-25 NOTE — Telephone Encounter (Signed)
Pt reports the 2 Linzess did not work. He will try 3-4 doses of Miralax, each in water and call me in the am.

## 2013-03-25 NOTE — Telephone Encounter (Signed)
Left message for pt that we need to cancel his appt tomorrow. Amy Esterwood, PA gave me orders for his constipation. Per Amy, he needs to take 3-4 doses of Miralax and give it time to work.

## 2013-03-25 NOTE — Telephone Encounter (Signed)
Pt called back to report he wants an appt d/t he's hasn't had a BM in 4 days. He has 2 Linzess, advised him to take both of them now with a full glass of water and call me if he gets the results so I may cancel his appt.

## 2013-03-25 NOTE — Telephone Encounter (Signed)
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 I have asked him to use Benefiber 1 tablespoon twice a day with his meals, to increase his by mouth fluids with Gatorade, juice, and water, also if he needs to use additional meds to try MiraLax at bedtime and also if needed Linzess 145 mcg a day. Is not on narcotics or any other medications that should cause constipation.  Much of his constipation is related to poor by mouth liquid intake because he is incontinent of urine and apparently is under urology care. I've offered repeat urology appointment but he declined.    Olene Floss, RN at 02/20/2013 2:16 PM     Status: Signed        Pt reports he was given Linzess , but he forget what it was for. Read pt back Dr Buel Ream orders for his constipation and the Linzess is the last med to try; pt stated understanding   Pt reports he has done all the things Dr Sharlett Iles suggested in note above. States he drinks 4 glasses of fluid prior to breakfast. I asked about the Linzess and he doesn't remember if he tried it or not and he asked for more samples. Left him samples of Linzess at desk.

## 2013-03-26 ENCOUNTER — Telehealth: Payer: Self-pay | Admitting: Gastroenterology

## 2013-03-26 ENCOUNTER — Ambulatory Visit: Payer: Medicare HMO | Admitting: Cardiology

## 2013-03-26 ENCOUNTER — Ambulatory Visit: Payer: Medicare HMO | Admitting: Physician Assistant

## 2013-03-26 NOTE — Telephone Encounter (Signed)
When I spoke with pt, he states he had a BM. Encouraged him to do the same thing next time; 3-4 doses of Miralax; pt stated understanding.

## 2013-04-02 ENCOUNTER — Ambulatory Visit (HOSPITAL_COMMUNITY): Payer: Medicare HMO | Attending: Cardiology | Admitting: Radiology

## 2013-04-02 VITALS — BP 134/92 | Ht 72.0 in | Wt 177.0 lb

## 2013-04-02 DIAGNOSIS — R0789 Other chest pain: Secondary | ICD-10-CM

## 2013-04-02 DIAGNOSIS — R079 Chest pain, unspecified: Secondary | ICD-10-CM

## 2013-04-02 DIAGNOSIS — R9431 Abnormal electrocardiogram [ECG] [EKG]: Secondary | ICD-10-CM

## 2013-04-02 DIAGNOSIS — R0602 Shortness of breath: Secondary | ICD-10-CM

## 2013-04-02 MED ORDER — TECHNETIUM TC 99M SESTAMIBI GENERIC - CARDIOLITE
10.0000 | Freq: Once | INTRAVENOUS | Status: AC | PRN
  Administered 2013-04-02: 10 via INTRAVENOUS

## 2013-04-02 MED ORDER — TECHNETIUM TC 99M SESTAMIBI GENERIC - CARDIOLITE
30.0000 | Freq: Once | INTRAVENOUS | Status: AC | PRN
  Administered 2013-04-02: 30 via INTRAVENOUS

## 2013-04-02 NOTE — Progress Notes (Signed)
Shawn Gaines [address] [place], [place] [address] [phone]    Cardiology Nuclear Med Study  [name] is a 73 y.o. male     MRN : [id number]     DOB: 1940-08-20  Procedure Date: 04/02/2013  Nuclear Med Background Indication for Stress Test:  Evaluation for Ischemia and [facility] 1/15 Dyspnea History:  COPD and No H/O CAD Cardiac Risk Factors: History of Smoking  Symptoms:  Chest Pain, DOE, Fatigue and SOB   Nuclear Pre-Procedure Caffeine/Decaff Intake:  None NPO After: 7:00am   Lungs:  clear O2 Sat: 95% on room air. IV 0.9% NS with Angio Cath:  22g  IV Site: R Antecubital  IV Started by:  Crissie Figures, RN  Chest Size (in):  42 Cup Size: n/a  Height: 6' (1.829 m)  Weight:  177 lb (80.287 kg)  BMI:  Body mass index is 24 kg/(m^2). Tech Comments:  N/A    Nuclear Med Study 1 or 2 day study: 1 day  Stress Test Type:  Stress  Reading MD: N/A  Order Authorizing Provider:  Darlin Coco, MD  Resting Radionuclide: Technetium 11m Sestamibi  Resting Radionuclide Dose: 11.0 mCi   Stress Radionuclide:  Technetium 75m Sestamibi  Stress Radionuclide Dose: 32.8 mCi           Stress Protocol Rest HR: 68 Stress HR: 136  Rest BP: 134/92 Stress BP: 167/94  Exercise Time (min): 7:00 METS: 8.50   Predicted Max HR: 147 bpm % Max HR: 92.52 bpm Rate Pressure Product: 22712   Dose of Adenosine (mg):  n/a Dose of Lexiscan: n/a mg  Dose of Atropine (mg): n/a Dose of Dobutamine: n/a mcg/kg/min (at max HR)  Stress Test Technologist: Perrin Maltese, EMT-P  Nuclear Technologist:  Charlton Amor, CNMT     Rest Procedure:  Myocardial perfusion imaging was performed at rest 45 minutes following the intravenous administration of Technetium 22m Sestamibi. Rest ECG: NSR - Normal EKG  Stress Procedure:  The patient exercised on the treadmill utilizing the Bruce Protocol for 7:00 minutes. The patient stopped due to doe, fatigue, and denied any chest  pain.  Technetium 80m Sestamibi was injected at peak exercise and myocardial perfusion imaging was performed after a brief delay. Stress ECG: No significant change from baseline ECG  QPS Raw Data Images:  Normal; no motion artifact; normal heart/lung ratio. Stress Images:  Normal homogeneous uptake in all areas of the myocardium. Rest Images:  Normal homogeneous uptake in all areas of the myocardium. Subtraction (SDS):  No evidence of ischemia. Transient Ischemic Dilatation (Normal <1.22):  0.94 Lung/Heart Ratio (Normal <0.45):  0.26  Quantitative Gated Spect Images QGS EDV:  119 ml QGS ESV:  54 ml  Impression Exercise Capacity:  Good exercise capacity. BP Response:  Normal blood pressure response. Clinical Symptoms:  No significant symptoms noted. ECG Impression:  No significant ST segment change suggestive of ischemia. Comparison with Prior Nuclear Study: No images to compare  Overall Impression:  Normal stress nuclear study.  LV Ejection Fraction: 55%.  LV Wall Motion:  NL LV Function; NL Wall Motion.   Thayer Headings, Brooke Bonito., MD, [facility] 04/02/2013, 4:54 PM Office - [phone] Pager [phone]

## 2013-04-03 ENCOUNTER — Telehealth: Payer: Self-pay | Admitting: Gastroenterology

## 2013-04-03 ENCOUNTER — Telehealth: Payer: Self-pay | Admitting: Cardiology

## 2013-04-03 NOTE — Telephone Encounter (Signed)
Called pt earlier to notify of stress tests results. He is calling back to thank the nuclear tech for their "super" treatment and kindness to him.  Advised wasn't sure which techs  assisted him and that they were off today.  Advised would send a message to [employer] the Freight forwarder.  States he would call back Monday because he wants to personally thank them.  Sent message to [place].

## 2013-04-03 NOTE — Telephone Encounter (Signed)
lmom for pt to call back

## 2013-04-03 NOTE — Telephone Encounter (Signed)
New problem   Pt returning a call.

## 2013-04-10 ENCOUNTER — Telehealth: Payer: Self-pay | Admitting: Gastroenterology

## 2013-04-10 NOTE — Telephone Encounter (Signed)
Pt called again. Call back is taking too long

## 2013-04-17 NOTE — Telephone Encounter (Signed)
This is not my patient and he has been discharged from PCP practice

## 2013-04-17 NOTE — Telephone Encounter (Signed)
Pt never called back. See 04/10/13 encounter.

## 2013-04-17 NOTE — Telephone Encounter (Signed)
Pt reports his bowels are wonderful. He went to a lady in [place] who did a "colonic cleanse" , 2 visits, and he now has 1-2 BMS a day. He has Ionic Yetta Glassman which is Mag+ he states. He must drink 90 oz of water daily, take probiotics and he has something called COLON MAX which is a potent natural formula per pt. Informed pt that I was glad he is having regular BMs, but he needs to watch his electrolytes and to make sure he informs his PCP; pt stated understanding.

## 2013-05-14 ENCOUNTER — Telehealth: Payer: Self-pay | Admitting: Gastroenterology

## 2013-05-14 NOTE — Telephone Encounter (Signed)
Spoke with patient. He does not have a preference for GI MD. He will call in October to set up OV with Nicoletta Ba, PA then decide on new GI. Gave patient the find a doctor number [phone] to assist him in getting a new PCP.

## 2013-05-26 ENCOUNTER — Encounter: Payer: Self-pay | Admitting: Internal Medicine

## 2013-05-26 ENCOUNTER — Ambulatory Visit (INDEPENDENT_AMBULATORY_CARE_PROVIDER_SITE_OTHER): Payer: Medicare HMO | Admitting: Internal Medicine

## 2013-05-26 VITALS — BP 128/82 | HR 64 | Temp 98.2°F | Resp 18 | Ht 71.0 in | Wt 181.4 lb

## 2013-05-26 DIAGNOSIS — R7309 Other abnormal glucose: Secondary | ICD-10-CM

## 2013-05-26 DIAGNOSIS — E538 Deficiency of other specified B group vitamins: Secondary | ICD-10-CM

## 2013-05-26 DIAGNOSIS — Z79899 Other long term (current) drug therapy: Secondary | ICD-10-CM

## 2013-05-26 DIAGNOSIS — E782 Mixed hyperlipidemia: Secondary | ICD-10-CM | POA: Insufficient documentation

## 2013-05-26 DIAGNOSIS — F411 Generalized anxiety disorder: Secondary | ICD-10-CM

## 2013-05-26 DIAGNOSIS — M1990 Unspecified osteoarthritis, unspecified site: Secondary | ICD-10-CM

## 2013-05-26 DIAGNOSIS — Z87898 Personal history of other specified conditions: Secondary | ICD-10-CM

## 2013-05-26 DIAGNOSIS — K2100 Gastro-esophageal reflux disease with esophagitis, without bleeding: Secondary | ICD-10-CM

## 2013-05-26 DIAGNOSIS — I10 Essential (primary) hypertension: Secondary | ICD-10-CM

## 2013-05-26 DIAGNOSIS — N433 Hydrocele, unspecified: Secondary | ICD-10-CM

## 2013-05-26 DIAGNOSIS — R0600 Dyspnea, unspecified: Secondary | ICD-10-CM

## 2013-05-26 DIAGNOSIS — E559 Vitamin D deficiency, unspecified: Secondary | ICD-10-CM

## 2013-05-26 HISTORY — DX: Essential (primary) hypertension: I10

## 2013-05-26 LAB — CBC WITH DIFFERENTIAL/PLATELET
BASOS ABS: 0.1 10*3/uL (ref 0.0–0.1)
BASOS PCT: 1 % (ref 0–1)
Eosinophils Absolute: 0.3 10*3/uL (ref 0.0–0.7)
Eosinophils Relative: 5 % (ref 0–5)
HCT: 40 % (ref 39.0–52.0)
Hemoglobin: 13.9 g/dL (ref 13.0–17.0)
Lymphocytes Relative: 22 % (ref 12–46)
Lymphs Abs: 1.2 10*3/uL (ref 0.7–4.0)
MCH: 32 pg (ref 26.0–34.0)
MCHC: 34.8 g/dL (ref 30.0–36.0)
MCV: 92 fL (ref 78.0–100.0)
Monocytes Absolute: 0.6 10*3/uL (ref 0.1–1.0)
Monocytes Relative: 12 % (ref 3–12)
NEUTROS PCT: 60 % (ref 43–77)
Neutro Abs: 3.2 10*3/uL (ref 1.7–7.7)
PLATELETS: 269 10*3/uL (ref 150–400)
RBC: 4.35 MIL/uL (ref 4.22–5.81)
RDW: 13 % (ref 11.5–15.5)
WBC: 5.4 10*3/uL (ref 4.0–10.5)

## 2013-05-26 LAB — HEMOGLOBIN A1C
HEMOGLOBIN A1C: 6 % — AB (ref <5.7)
Mean Plasma Glucose: 126 mg/dL — ABNORMAL HIGH (ref <117)

## 2013-05-26 MED ORDER — OMEPRAZOLE 40 MG PO CPDR: 40.0000 mg | DELAYED_RELEASE_CAPSULE | Freq: Every day | ORAL | Status: DC

## 2013-05-26 NOTE — Progress Notes (Signed)
   Subjective:    Patient ID: Shawn Gaines, male    DOB: September 03, 1940, 73 y.o.   MRN: [id number]  HPI  The patient is a 73 yo Sep WM separated now after 31 yr marriage who presents to establish as new patient of the practice. Review of his records in the [place] reveals a few elevated BP's and also note after a recent ER evaluation for CP,  he was evaluated by Dr Mare Ferrari and had a stress Cardiolyte which was negative and normal.  He has recently been referred by Dr Eddie Dibbles to Dr Tamera Punt for ongoing problems with his Lt shoulder. Also, he has seen Dr Polly Cobia for sinus congestion and reflux for which he takes occasional Omeprazole. He denies any dysphagia or water brash or reflux and has infrequent epigastric burning dyspepsia for which he takes Omeprazole infrequently.Marland Kitchen He does have an occasional dry cough. Also, he has intermittent problems with constipation and recently went out of town for a colon cleans and is taking several OTC products which appear to be primarily high magnesium components. He sees Dr Jasmine December for prostate checks. He is s/p Bilat TKR's by Dr Mayer Camel.  Medication Sig  . aspirin (ASPIRIN CHILDRENS) 81 MG chewable tablet Chew 1 tablet (81 mg total) by mouth daily.  . finasteride (PROSCAR) 5 MG tablet Take 5 mg by mouth daily.   . fluticasone (FLONASE) 50 MCG/ACT nasal spray Place 2 sprays into the nose daily.  . Polyethylene Glycol 3350 (EQ CLEARLAX PO) Take by mouth. 1 capful at night  . Tamsulosin HCl (FLOMAX) 0.4 MG CAPS Take 0.4 mg by mouth daily.   Allergies  Allergen Reactions  . Doxycycline     Loss of balance & fatigue    Past Medical History  Diagnosis Date  . Prostatic hypertrophy   . Cancer     skin cancer- R Buttocks-bx was postive - pt not sure what kind of cancer- for removeal in March  . Arthritis     Knee  . Rotator cuff disorder     BIL  . Colon polyps     TWO HYPERPLASTIC POLYPS  . Unspecified essential hypertension 05/26/2013   FHX and SHX - not  remarkable or contributory.  Review of Systems 12 point Sustems Review otherwise negative.    Objective:   Physical Exam  BP 128/82  Pulse 64  Temp 98.2 F   Resp 18  Ht 5\' 11"    Wt 181 lb 6.4 oz  BMI 25.31 kg/m2  HEENT - Eac's patent. TM's Nl.EOM's full. PERRLA. NasoOroPharynx clear. Neck - supple. Nl Thyroid. No bruits nodes JVD Chest - Clear equal BS Cor - Nl HS. RRR w/o sig MGR. PP 1(+) No edema. Abd - No palpable organomegaly, masses or tenderness. BS nl. MS- FROM. w/o deformities. Muscle power tone and bulk Nl. Gait Nl. Neuro - No obvious Cr N abnormalities. Sensory, motor and Cerebellar functions appear Nl w/o focal abnormalities.  Assessment & Plan:   1. Unspecified hypertension, sreening for endorgan damage  - TSH  2. Mixed hyperlipidemia, screening - Lipid panel  3. Other abnormal glucose, screening for glucose intolerance or insulin resistance - Hemoglobin A1c - Insulin, fasting  4. Encounter for long-term (current) use of other medications - CBC with Differential - Hepatic function panel - Magnesium - BASIC METABOLIC PANEL WITH GFR - Urine Microscopic  5. Unspecified vitamin D deficiency - Vit D  25 hydroxy (rtn osteoporosis monitoring)

## 2013-05-26 NOTE — Patient Instructions (Signed)

## 2013-05-27 LAB — BASIC METABOLIC PANEL WITH GFR
BUN: 8 mg/dL (ref 6–23)
CHLORIDE: 98 meq/L (ref 96–112)
CO2: 24 mEq/L (ref 19–32)
Calcium: 9 mg/dL (ref 8.4–10.5)
Creat: 0.7 mg/dL (ref 0.50–1.35)
Glucose, Bld: 94 mg/dL (ref 70–99)
Potassium: 4.6 mEq/L (ref 3.5–5.3)
Sodium: 130 mEq/L — ABNORMAL LOW (ref 135–145)

## 2013-05-27 LAB — INSULIN, FASTING: INSULIN FASTING, SERUM: 5 u[IU]/mL (ref 3–28)

## 2013-05-27 LAB — HEPATIC FUNCTION PANEL
ALBUMIN: 4.4 g/dL (ref 3.5–5.2)
ALT: 17 U/L (ref 0–53)
AST: 17 U/L (ref 0–37)
Alkaline Phosphatase: 71 U/L (ref 39–117)
BILIRUBIN TOTAL: 0.7 mg/dL (ref 0.2–1.2)
Bilirubin, Direct: 0.1 mg/dL (ref 0.0–0.3)
Indirect Bilirubin: 0.6 mg/dL (ref 0.2–1.2)
TOTAL PROTEIN: 6.6 g/dL (ref 6.0–8.3)

## 2013-05-27 LAB — URINALYSIS, MICROSCOPIC ONLY
Bacteria, UA: NONE SEEN
CASTS: NONE SEEN
Crystals: NONE SEEN
Squamous Epithelial / LPF: NONE SEEN

## 2013-05-27 LAB — LIPID PANEL
CHOL/HDL RATIO: 4.6 ratio
Cholesterol: 157 mg/dL (ref 0–200)
HDL: 34 mg/dL — AB (ref >39)
LDL Cholesterol: 101 mg/dL — ABNORMAL HIGH (ref 0–99)
Triglycerides: 111 mg/dL (ref <150)
VLDL: 22 mg/dL (ref 0–40)

## 2013-05-27 LAB — TSH: TSH: 1.918 u[IU]/mL (ref 0.350–4.500)

## 2013-05-27 LAB — MAGNESIUM: MAGNESIUM: 2 mg/dL (ref 1.5–2.5)

## 2013-05-27 LAB — VITAMIN D 25 HYDROXY (VIT D DEFICIENCY, FRACTURES): VIT D 25 HYDROXY: 19 ng/mL — AB (ref 30–89)

## 2013-06-03 ENCOUNTER — Telehealth: Payer: Self-pay | Admitting: *Deleted

## 2013-06-03 ENCOUNTER — Other Ambulatory Visit: Payer: Self-pay | Admitting: Internal Medicine

## 2013-06-03 MED ORDER — CITALOPRAM HYDROBROMIDE 20 MG PO TABS: 20.0000 mg | ORAL_TABLET | Freq: Every day | ORAL | Status: DC

## 2013-06-03 NOTE — Telephone Encounter (Signed)
Patient called and requested Rx for anxiety. RX for Citalopram 20 mg sent to [facility].  Informed patient med would take 2-4 weeks to work and would need OV to discuss different med if this one does not work.

## 2013-06-09 ENCOUNTER — Other Ambulatory Visit: Payer: Self-pay | Admitting: Internal Medicine

## 2013-06-16 ENCOUNTER — Other Ambulatory Visit: Payer: Self-pay | Admitting: Orthopedic Surgery

## 2013-06-16 DIAGNOSIS — M25512 Pain in left shoulder: Secondary | ICD-10-CM

## 2013-06-20 ENCOUNTER — Ambulatory Visit
Admission: RE | Admit: 2013-06-20 | Discharge: 2013-06-20 | Disposition: A | Discharge status: Home / Self Care | Payer: Medicare HMO | Source: Ambulatory Visit | Attending: Orthopedic Surgery | Admitting: Orthopedic Surgery

## 2013-06-20 DIAGNOSIS — M25512 Pain in left shoulder: Secondary | ICD-10-CM

## 2013-06-26 ENCOUNTER — Telehealth: Payer: Self-pay | Admitting: Internal Medicine

## 2013-06-26 NOTE — Telephone Encounter (Signed)
error 

## 2013-06-29 NOTE — Telephone Encounter (Signed)
lvm for pt to call me back regarding info about a probiotic called zymbiotix (which is a colon cleanse)

## 2013-07-07 ENCOUNTER — Ambulatory Visit: Payer: Medicare HMO | Admitting: Internal Medicine

## 2013-07-07 ENCOUNTER — Encounter: Payer: Self-pay | Admitting: Internal Medicine

## 2013-07-07 VITALS — BP 130/84 | HR 68 | Temp 98.1°F | Resp 18 | Ht 71.0 in | Wt 179.6 lb

## 2013-07-07 DIAGNOSIS — E782 Mixed hyperlipidemia: Secondary | ICD-10-CM

## 2013-07-07 DIAGNOSIS — R7309 Other abnormal glucose: Secondary | ICD-10-CM

## 2013-07-07 DIAGNOSIS — R7303 Prediabetes: Secondary | ICD-10-CM | POA: Insufficient documentation

## 2013-07-07 DIAGNOSIS — E559 Vitamin D deficiency, unspecified: Secondary | ICD-10-CM

## 2013-07-07 DIAGNOSIS — I10 Essential (primary) hypertension: Secondary | ICD-10-CM

## 2013-07-07 NOTE — Progress Notes (Signed)
Subjective:    Patient ID: Shawn Gaines, male    DOB: 08/19/1940, 73 y.o.   MRN: [id number]  Patient presents today reporting BP was elevated 182/87 at recent PT appt as he apparently became fatigued during the session.  He denies CP or dyspnea. He's receiving PT for his shoulder per Dr Tamera Punt. After resting a few minutes his BP dropped to 114/78. BP was normal at recent OV her as well as today's BP 130/84. At recent OV he was found to have slightly elevated A1c 6.0% and very low Vit D 19.  Hypertension This is a new problem. The current episode started today. The problem has been resolved since onset. The problem is controlled. Pertinent negatives include no chest pain, headaches or palpitations. Past treatments include nothing.     Medication List       This list is accurate as of: 07/07/13  6:45 PM.  Always use your most recent med list.               aspirin 81 MG chewable tablet  Commonly known as:  ASPIRIN CHILDRENS  Chew 1 tablet (81 mg total) by mouth daily.     citalopram 20 MG tablet  Commonly known as:  CELEXA  Take 1 tablet (20 mg total) by mouth daily. For chronic anxiety     EQ CLEARLAX PO  Take by mouth. 1 capful at night     finasteride 5 MG tablet  Commonly known as:  PROSCAR  Take 5 mg by mouth daily.     fluticasone 50 MCG/ACT nasal spray  Commonly known as:  FLONASE  Place 2 sprays into the nose daily.     MAGNESIUM FIZZ-PLUS 300-500 MG/6.1GM Pdef  Generic drug:  Magnesium-Potassium  Take by mouth. 1 scoop of powder at bedtime     omeprazole 40 MG capsule  Commonly known as:  PRILOSEC  Take 1 capsule (40 mg total) by mouth daily.     PROBIOTIC DAILY PO  Take by mouth.     tamsulosin 0.4 MG Caps capsule  Commonly known as:  FLOMAX  Take 0.4 mg by mouth daily.       Allergies  Allergen Reactions  . Doxycycline     Loss of balance & fatigue   Past Medical History  Diagnosis Date  . Prostatic hypertrophy   . Cancer     skin cancer- R  Buttocks-bx was postive - pt not sure what kind of cancer- for removeal in March  . Arthritis     Knee  . Rotator cuff disorder     BIL  . Colon polyps     TWO HYPERPLASTIC POLYPS  . Unspecified essential hypertension 05/26/2013    Review of Systems  Constitutional: Negative.  Negative for chills and diaphoresis.  HENT: Negative.   Eyes: Negative.   Respiratory: Negative.   Cardiovascular: Negative.  Negative for chest pain, palpitations and leg swelling.  Gastrointestinal: Negative.   Musculoskeletal: Negative.   Skin: Positive for pallor. Negative for rash and wound.  Neurological: Negative for dizziness, tremors, speech difficulty, weakness, light-headedness, numbness and headaches.   BP 130/84  Pulse 68  Temp(Src) 98.1 F (36.7 C) (Temporal)  Resp 18  Ht 5\' 11"  (1.803 m)  Wt 179 lb 9.6 oz (81.466 kg)  BMI 25.06 kg/m2  Objective:   Physical Exam  Constitutional: He is oriented to person, place, and time. He appears well-developed and well-nourished.  HENT:  Head: Atraumatic.  Mouth/Throat: No oropharyngeal exudate.  Eyes: Conjunctivae and EOM are normal.  Neck: Normal range of motion. Neck supple. No JVD present. No thyromegaly present.  Cardiovascular: Normal rate and regular rhythm.   Murmur heard. Pulmonary/Chest: Effort normal and breath sounds normal. No respiratory distress. He has no wheezes. He has no rales. He exhibits no tenderness.  Abdominal: Soft.  Musculoskeletal: Normal range of motion.  Except decreased ROM of the left shoulder  Lymphadenopathy:    He has no cervical adenopathy.  Neurological: He is alert and oriented to person, place, and time. He displays normal reflexes. No cranial nerve deficit.  Skin: Skin is warm and dry. No rash noted. No erythema. No pallor.  Psychiatric: He has a normal mood and affect.   Assessment & Plan:   1. Labile Hypertension - Advised random monitoring of BP's  2. Hyperlipidemia  3. PreDiabetes  4. Vitamin D  deficiency

## 2013-07-07 NOTE — Patient Instructions (Signed)

## 2013-07-13 ENCOUNTER — Ambulatory Visit (INDEPENDENT_AMBULATORY_CARE_PROVIDER_SITE_OTHER): Payer: Medicare HMO | Admitting: Physician Assistant

## 2013-07-13 ENCOUNTER — Ambulatory Visit: Payer: Self-pay | Admitting: Physician Assistant

## 2013-07-13 ENCOUNTER — Ambulatory Visit (INDEPENDENT_AMBULATORY_CARE_PROVIDER_SITE_OTHER): Payer: Medicare HMO | Admitting: Gastroenterology

## 2013-07-13 ENCOUNTER — Telehealth: Payer: Self-pay | Admitting: Gastroenterology

## 2013-07-13 ENCOUNTER — Encounter: Payer: Self-pay | Admitting: Physician Assistant

## 2013-07-13 ENCOUNTER — Encounter: Payer: Self-pay | Admitting: Gastroenterology

## 2013-07-13 ENCOUNTER — Other Ambulatory Visit (INDEPENDENT_AMBULATORY_CARE_PROVIDER_SITE_OTHER): Payer: Medicare HMO

## 2013-07-13 VITALS — BP 102/68 | HR 68 | Temp 98.2°F | Resp 16 | Wt 174.0 lb

## 2013-07-13 VITALS — BP 112/78 | HR 69 | Ht 71.0 in | Wt 176.0 lb

## 2013-07-13 DIAGNOSIS — R5383 Other fatigue: Secondary | ICD-10-CM

## 2013-07-13 DIAGNOSIS — E871 Hypo-osmolality and hyponatremia: Secondary | ICD-10-CM

## 2013-07-13 DIAGNOSIS — R110 Nausea: Secondary | ICD-10-CM

## 2013-07-13 DIAGNOSIS — R5381 Other malaise: Secondary | ICD-10-CM

## 2013-07-13 DIAGNOSIS — K589 Irritable bowel syndrome without diarrhea: Secondary | ICD-10-CM

## 2013-07-13 LAB — CBC
HEMATOCRIT: 37.3 % — AB (ref 39.0–52.0)
HEMOGLOBIN: 12.8 g/dL — AB (ref 13.0–17.0)
MCHC: 34.3 g/dL (ref 30.0–36.0)
MCV: 95.8 fl (ref 78.0–100.0)
Platelets: 193 10*3/uL (ref 150.0–400.0)
RBC: 3.9 Mil/uL — AB (ref 4.22–5.81)
RDW: 12.6 % (ref 11.5–15.5)
WBC: 4.8 10*3/uL (ref 4.0–10.5)

## 2013-07-13 LAB — BASIC METABOLIC PANEL
BUN: 9 mg/dL (ref 6–23)
CALCIUM: 8.9 mg/dL (ref 8.4–10.5)
CO2: 24 meq/L (ref 19–32)
CREATININE: 0.8 mg/dL (ref 0.4–1.5)
Chloride: 96 mEq/L (ref 96–112)
GFR: 106.53 mL/min (ref >60.00)
GLUCOSE: 89 mg/dL (ref 70–99)
Potassium: 3.8 mEq/L (ref 3.5–5.1)
SODIUM: 127 meq/L — AB (ref 135–145)

## 2013-07-13 MED ORDER — RESTORA PO CAPS: ORAL_CAPSULE | ORAL | Status: DC

## 2013-07-13 NOTE — Patient Instructions (Addendum)
Stop Celexa Decrease fluids- can drink tomato juice, G2 (gatordade with less sugar)  Increase salt and use liberally  Hyponatremia  Hyponatremia is when the amount of salt (sodium) in your blood is too low. When sodium levels are low, your cells will absorb extra water and swell. The swelling happens throughout the body, but it mostly affects the brain. Severe brain swelling (cerebral edema), seizures, or coma can happen.  CAUSES   Heart, kidney, or liver problems.  Thyroid problems.  Adrenal gland problems.  Severe vomiting and diarrhea.  Certain medicines or illegal drugs.  Dehydration.  Drinking too much water.  Low-sodium diet. SYMPTOMS   Nausea and vomiting.  Confusion.  Lethargy.  Agitation.  Headache.  Twitching or shaking (seizures).  Unconsciousness.  Appetite loss.  Muscle weakness and cramping. DIAGNOSIS  Hyponatremia is identified by a simple blood test. Your caregiver will perform a history and physical exam to try to find the cause and type of hyponatremia. Other tests may be needed to measure the amount of sodium in your blood and urine. TREATMENT  Treatment will depend on the cause.   Fluids may be given through the vein (IV).  Medicines may be used to correct the sodium imbalance. If medicines are causing the problem, they will need to be adjusted.  Water or fluid intake may be restricted to restore proper balance. The speed of correcting the sodium problem is very important. If the problem is corrected too fast, nerve damage (sometimes unchangeable) can happen. HOME CARE INSTRUCTIONS   Only take medicines as directed by your caregiver. Many medicines can make hyponatremia worse. Discuss all your medicines with your caregiver.  Carefully follow any recommended diet, including any fluid restrictions.  You may be asked to repeat lab tests. Follow these directions.  Avoid alcohol and recreational drugs. SEEK MEDICAL CARE IF:   You develop  worsening nausea, fatigue, headache, confusion, or weakness.  Your original hyponatremia symptoms return.  You have problems following the recommended diet. SEEK IMMEDIATE MEDICAL CARE IF:   You have a seizure.  You faint.  You have ongoing diarrhea or vomiting. MAKE SURE YOU:   Understand these instructions.  Will watch your condition.  Will get help right away if you are not doing well or get worse. Document Released: 02/09/2002 Document Revised: 05/14/2011 Document Reviewed: 08/06/2010 [facility] Patient Information 2014 [place], [place].

## 2013-07-13 NOTE — Patient Instructions (Signed)
Your physician has requested that you go to the basement for the following lab work before leaving today: Shawn Gaines have a follow up visit with Dr. Hilarie Fredrickson scheduled on 09-08-2013 at 845 am.  We have given you samples of the following medication to take: Resotora, take one capsule by mouth once daily  You can purchase this over the counter or Align   High Fiber Diet is below for you to follow. ___________________________________________________________________________________  High-Fiber Diet Fiber is found in fruits, vegetables, and grains. A high-fiber diet encourages the addition of more whole grains, legumes, fruits, and vegetables in your diet. The recommended amount of fiber for adult males is 38 g per day. For adult females, it is 25 g per day. Pregnant and lactating women should get 28 g of fiber per day. If you have a digestive or bowel problem, ask your caregiver for advice before adding high-fiber foods to your diet. Eat a variety of high-fiber foods instead of only a select few type of foods.  PURPOSE  To increase stool bulk.  To make bowel movements more regular to prevent constipation.  To lower cholesterol.  To prevent overeating. WHEN IS THIS DIET USED?  It may be used if you have constipation and hemorrhoids.  It may be used if you have uncomplicated diverticulosis (intestine condition) and irritable bowel syndrome.  It may be used if you need help with weight management.  It may be used if you want to add it to your diet as a protective measure against atherosclerosis, diabetes, and cancer. SOURCES OF FIBER  Whole-grain breads and cereals.  Fruits, such as apples, oranges, bananas, berries, prunes, and pears.  Vegetables, such as green peas, carrots, sweet potatoes, beets, broccoli, cabbage, spinach, and artichokes.  Legumes, such split peas, soy, lentils.  Almonds. FIBER CONTENT IN FOODS Starches and Grains / Dietary Fiber (g)  Cheerios, 1 cup / 3  g  Corn Flakes cereal, 1 cup / 0.7 g  Rice crispy treat cereal, 1 cup / 0.3 g  Instant oatmeal (cooked),  cup / 2 g  Frosted wheat cereal, 1 cup / 5.1 g  Brown, long-grain rice (cooked), 1 cup / 3.5 g  White, long-grain rice (cooked), 1 cup / 0.6 g  Enriched macaroni (cooked), 1 cup / 2.5 g Legumes / Dietary Fiber (g)  Baked beans (canned, plain, or vegetarian),  cup / 5.2 g  Kidney beans (canned),  cup / 6.8 g  Pinto beans (cooked),  cup / 5.5 g Breads and Crackers / Dietary Fiber (g)  Plain or honey graham crackers, 2 squares / 0.7 g  Saltine crackers, 3 squares / 0.3 g  Plain, salted pretzels, 10 pieces / 1.8 g  Whole-wheat bread, 1 slice / 1.9 g  White bread, 1 slice / 0.7 g  Raisin bread, 1 slice / 1.2 g  Plain bagel, 3 oz / 2 g  Flour tortilla, 1 oz / 0.9 g  Corn tortilla, 1 small / 1.5 g  Hamburger or hotdog bun, 1 small / 0.9 g Fruits / Dietary Fiber (g)  Apple with skin, 1 medium / 4.4 g  Sweetened applesauce,  cup / 1.5 g  Banana,  medium / 1.5 g  Grapes, 10 grapes / 0.4 g  Orange, 1 small / 2.3 g  Raisin, 1.5 oz / 1.6 g  Melon, 1 cup / 1.4 g Vegetables / Dietary Fiber (g)  Green beans (canned),  cup / 1.3 g  Carrots (cooked),  cup /  2.3 g  Broccoli (cooked),  cup / 2.8 g  Peas (cooked),  cup / 4.4 g  Mashed potatoes,  cup / 1.6 g  Lettuce, 1 cup / 0.5 g  Corn (canned),  cup / 1.6 g  Tomato,  cup / 1.1 g Document Released: 02/19/2005 Document Revised: 08/21/2011 Document Reviewed: 05/24/2011 [facility] Patient Information 2014 [place], [place].

## 2013-07-13 NOTE — Telephone Encounter (Signed)
Patient states he feels like a "piece of poop." States he has loss of appetite and change in bowels. He has previously requested establishing with Dr. Hilarie Fredrickson. Scheduled with Alonza Bogus, PA today at 2:30 PM.

## 2013-07-13 NOTE — Telephone Encounter (Signed)
Left a message for patient to call me. 

## 2013-07-13 NOTE — Progress Notes (Signed)
   Subjective:    Patient ID: Shawn Gaines, male    DOB: 1940-05-17, 73 y.o.   MRN: [id number]  HPI 73 y.o. male with history of preDM, chol presents with decreased appetite, weakness for several weeks. He has had an extensive work up for [employer]. He has had a negative colonoscopy with Dr. Sharlett Iles 04/2012, Myoview view stress test 04/02/2013 EF 55%, CT head normal 05/2012, CXR showed COPD 03/2013. He was recently put on Celexa and has taken for 5 days, he has had the weakness and anorexia for 2-3 weeks. His sodium was 127 today at GI.  He also complains of decreased hair for 1 month. He lives alone and drove himself here, he does not want to go to the ER.  He states he drinks 80 CC a day.  Lab Results  Component Value Date   CREATININE 0.8 07/13/2013   BUN 9 07/13/2013   NA 127* 07/13/2013   K 3.8 07/13/2013   CL 96 07/13/2013   CO2 24 07/13/2013   Lab Results  Component Value Date   WBC 4.8 07/13/2013   HGB 12.8* 07/13/2013   HCT 37.3* 07/13/2013   MCV 95.8 07/13/2013   PLT 193.0 07/13/2013    Review of Systems  Constitutional: Positive for activity change, appetite change and fatigue. Negative for fever, chills and diaphoresis.  HENT: Negative.   Respiratory: Negative.   Cardiovascular: Negative.   Gastrointestinal: Positive for nausea and diarrhea. Negative for vomiting, abdominal pain, constipation, blood in stool, abdominal distention, anal bleeding and rectal pain.  Genitourinary: Negative.   Musculoskeletal: Negative.   Skin: Negative.   Neurological: Positive for dizziness, weakness and light-headedness. Negative for tremors, seizures, syncope, facial asymmetry, speech difficulty, numbness and headaches.  Hematological: Negative.   Psychiatric/Behavioral: Negative.        Objective:   Physical Exam  Constitutional: He is oriented to person, place, and time. He appears well-developed and well-nourished.  HENT:  Head: Normocephalic and atraumatic.  Eyes: Conjunctivae are  normal. Pupils are equal, round, and reactive to light.  Neck: Normal range of motion. Neck supple.  Cardiovascular: Normal rate and regular rhythm.   Pulmonary/Chest: Effort normal and breath sounds normal.  Abdominal: Soft. He exhibits no distension. There is tenderness (right lower quadrant). There is no rebound and no guarding.  Musculoskeletal: Normal range of motion.  Lymphadenopathy:    He has cervical adenopathy.  Neurological: He is alert and oriented to person, place, and time. He displays abnormal reflex.  Skin: Skin is warm and dry.       Assessment & Plan:  Fatigue/nausea- likely from hyponatremia worsened by citalopram and recent loose stools- will stop celexa, decrease fluids (normally drinks 80-100 cc a day), increase sodium and follow up on Thursday OV- patient will go to the ER if any changes.

## 2013-07-14 ENCOUNTER — Encounter: Payer: Self-pay | Admitting: Gastroenterology

## 2013-07-14 ENCOUNTER — Ambulatory Visit: Payer: Self-pay | Admitting: Internal Medicine

## 2013-07-14 NOTE — Telephone Encounter (Signed)
Spoke with patient and he called the wrong number.

## 2013-07-16 ENCOUNTER — Encounter: Payer: Self-pay | Admitting: Physician Assistant

## 2013-07-16 ENCOUNTER — Ambulatory Visit: Payer: Medicare HMO | Admitting: Physician Assistant

## 2013-07-16 VITALS — BP 110/78 | HR 72 | Temp 97.9°F | Resp 16 | Wt 177.0 lb

## 2013-07-16 DIAGNOSIS — K589 Irritable bowel syndrome without diarrhea: Secondary | ICD-10-CM | POA: Insufficient documentation

## 2013-07-16 DIAGNOSIS — R5381 Other malaise: Secondary | ICD-10-CM

## 2013-07-16 DIAGNOSIS — E871 Hypo-osmolality and hyponatremia: Secondary | ICD-10-CM

## 2013-07-16 DIAGNOSIS — E876 Hypokalemia: Secondary | ICD-10-CM

## 2013-07-16 DIAGNOSIS — Z79899 Other long term (current) drug therapy: Secondary | ICD-10-CM

## 2013-07-16 DIAGNOSIS — R5383 Other fatigue: Secondary | ICD-10-CM | POA: Insufficient documentation

## 2013-07-16 DIAGNOSIS — D509 Iron deficiency anemia, unspecified: Secondary | ICD-10-CM

## 2013-07-16 LAB — BASIC METABOLIC PANEL WITH GFR
BUN: 11 mg/dL (ref 6–23)
CALCIUM: 9 mg/dL (ref 8.4–10.5)
CO2: 22 mEq/L (ref 19–32)
Chloride: 100 mEq/L (ref 96–112)
Creat: 0.83 mg/dL (ref 0.50–1.35)
GFR, Est Non African American: 87 mL/min
Glucose, Bld: 84 mg/dL (ref 70–99)
Potassium: 4.1 mEq/L (ref 3.5–5.3)
SODIUM: 132 meq/L — AB (ref 135–145)

## 2013-07-16 LAB — HEPATIC FUNCTION PANEL
ALT: 18 U/L (ref 0–53)
AST: 20 U/L (ref 0–37)
Albumin: 4.1 g/dL (ref 3.5–5.2)
Alkaline Phosphatase: 74 U/L (ref 39–117)
BILIRUBIN DIRECT: 0.1 mg/dL (ref 0.0–0.3)
Indirect Bilirubin: 0.5 mg/dL (ref 0.2–1.2)
TOTAL PROTEIN: 6.4 g/dL (ref 6.0–8.3)
Total Bilirubin: 0.6 mg/dL (ref 0.2–1.2)

## 2013-07-16 LAB — CBC WITH DIFFERENTIAL/PLATELET
BASOS ABS: 0 10*3/uL (ref 0.0–0.1)
BASOS PCT: 1 % (ref 0–1)
Eosinophils Absolute: 0.4 10*3/uL (ref 0.0–0.7)
Eosinophils Relative: 9 % — ABNORMAL HIGH (ref 0–5)
HCT: 36.3 % — ABNORMAL LOW (ref 39.0–52.0)
Hemoglobin: 12.8 g/dL — ABNORMAL LOW (ref 13.0–17.0)
Lymphocytes Relative: 26 % (ref 12–46)
Lymphs Abs: 1.2 10*3/uL (ref 0.7–4.0)
MCH: 32.8 pg (ref 26.0–34.0)
MCHC: 35.3 g/dL (ref 30.0–36.0)
MCV: 93.1 fL (ref 78.0–100.0)
MONOS PCT: 13 % — AB (ref 3–12)
Monocytes Absolute: 0.6 10*3/uL (ref 0.1–1.0)
NEUTROS PCT: 51 % (ref 43–77)
Neutro Abs: 2.4 10*3/uL (ref 1.7–7.7)
Platelets: 196 10*3/uL (ref 150–400)
RBC: 3.9 MIL/uL — ABNORMAL LOW (ref 4.22–5.81)
RDW: 13.5 % (ref 11.5–15.5)
WBC: 4.7 10*3/uL (ref 4.0–10.5)

## 2013-07-16 LAB — MAGNESIUM: Magnesium: 1.8 mg/dL (ref 1.5–2.5)

## 2013-07-16 LAB — IRON AND TIBC
%SAT: 25 % (ref 20–55)
IRON: 91 ug/dL (ref 42–165)
TIBC: 360 ug/dL (ref 215–435)
UIBC: 269 ug/dL (ref 125–400)

## 2013-07-16 MED ORDER — BUPROPION HCL ER (XL) 150 MG PO TB24: 150.0000 mg | ORAL_TABLET | ORAL | Status: DC

## 2013-07-16 MED ORDER — DIAZEPAM 5 MG PO TABS: 5.0000 mg | ORAL_TABLET | Freq: Three times a day (TID) | ORAL | Status: DC | PRN

## 2013-07-16 NOTE — Patient Instructions (Addendum)
Stop celexa Can try taking wellbutrin once dialy for depression Can take 1/2-1 of diazepam for anxiety    Bad carbs also include fruit juice, alcohol, and sweet tea. These are empty calories that do not signal to your brain that you are full.   Please remember the good carbs are still carbs which convert into sugar. So please measure them out no more than 1/2-1 cup of rice, oatmeal, pasta, and beans.  Veggies are however free foods! Pile them on.   I like lean protein at every meal such as chicken, [place], pork chops, cottage cheese, etc. Just do not fry these meats and please center your meal around vegetable, the meats should be a side dish.   No all fruit is created equal. Please see the list below, the fruit at the bottom is higher in sugars than the fruit at the top   Hyponatremia  Hyponatremia is when the salt (sodium) in your blood is low. When salt becomes low, your cells take in extra water and puff up (swell). The puffiness can happen in the whole body. It mostly affects the brain and is very serious.  HOME CARE  Only take medicine as told by your doctor.  Follow any diet instructions you were given. This includes limiting how much fluid you drink.  Keep all doctor visits for tests as told.  Avoid alcohol and drugs. GET HELP RIGHT AWAY IF:  You start to twitch and shake (seize).  You pass out (faint).  You continue to have watery poop (diarrhea) or you throw up (vomit).  You feel sick to your stomach (nauseous).  You are tired (fatigued), have a headache, are confused, or feel weak.  Your problems that first brought you to the doctor come back.  You have trouble following your diet instructions. MAKE SURE YOU:   Understand these instructions.  Will watch your condition.  Will get help right away if you are not doing well or get worse. Document Released: 11/01/2010 Document Revised: 05/14/2011 Document Reviewed: 11/01/2010 [facility] Patient Information 2014  [place], [place].

## 2013-07-16 NOTE — Progress Notes (Addendum)
07/16/2013 Shawn Gaines [id number] [birth year]   History of Present Illness:  This is a 73 year old who is previously known to Dr. Sharlett Iles for treatment of chronic, long-standing constipation. He last had a colonoscopy in February 2014 at which time the study was grossly normal, but he did have a poor prep. He is currently put in for a recall in February 2024, but question if this may need to be re-addressed and repeated sooner due to his poor prep.  Anyway, he presents to our office today with what he reports as a change in his bowels.  He says that his bowel movements are like "rabbit pellets" but contained a lot of liquid as well. He also reports a loss of appetite for the past few days and has felt extremely fatigued and feels like his balance is off for the past 3 weeks or so. He has been under a lot of emotional stress with a divorce after 30 some years of marriage. He was recently started on an antidepressant by his PCP in the form of citalopram 20 mg daily to treat both some underlying depression and anxiety, I believe.  He also reports a lot of bloating in his abdomen and what he describes as sensations of "explosions" in his gut.  He has been taking Pepto-Bismol, which has been causing his stool to be dark in color. He usually takes MiraLax daily for his constipation issues and has tried Linzess in the past as well.  He is not the best historian and it was somewhat difficult to understand his symptoms completely.  He has an appointment this afternoon with his PCP as well to discuss some of his general complaints.   Current Medications, Allergies, Past Medical History, Past Surgical History, Family History and Social History were reviewed in [employer] record.   Physical Exam: BP 112/78  Pulse 69  Ht 5\' 11"  (1.803 m)  Wt 176 lb (79.833 kg)  BMI 24.56 kg/m2 General: Well developed white male in no acute distress Head: Normocephalic and atraumatic Eyes:  Sclerae  anicteric, conjunctiva pink  Ears: Normal auditory acuity Lungs: Clear throughout to auscultation Heart: Regular rate and rhythm Abdomen: Soft, non-distended.  Normal bowel sounds.  Non-tender. Musculoskeletal: Symmetrical with no gross deformities  Extremities: No edema  Neurological: Alert oriented x 4, grossly non-focal Psychological:  Alert and cooperative. Normal mood and affect  Assessment and Recommendations: -Complains of poor appetite, fatigue, bowel issues, and abdominal discomfort/bloating. I think that he likely has a diagnosis of IBS.  A large component of his complaints may be related to stress/anxiety/mild depression for which she is currently being treated with recently starting an antidepressant/anxiolytic.  He should continue that, and it may help his GI symptoms to some degree. He has been treated for chronic constipation. I have asked him to begin taking a daily probiotic and we have given him some samples as well as a list of names of those that we recommend trying. He'll also start a daily fiber supplement in conjunction with his daily MiraLax.  We will check a CBC and BMP. He'll followup in 4-6 weeks with Dr. Hilarie Fredrickson.  Addendum: Reviewed and agree with initial management. Jerene Bears, MD

## 2013-07-16 NOTE — Progress Notes (Signed)
HPI 73 y.o.male presents for follow up for hyponatremia. He no longer had fatigue, leg cramps, weakness.  He was taken off celexa and has done gatorade, decreased fluids, and increased salt. He states he started to feel better the next day. Yesterday he had one episode of very dark possible black stool, no blood. States occasionally he feels like he goes on a high and then goes low with no energy.   Lab Results  Component Value Date   CREATININE 0.8 07/13/2013   BUN 9 07/13/2013   NA 127* 07/13/2013   K 3.8 07/13/2013   CL 96 07/13/2013   CO2 24 07/13/2013   Lab Results  Component Value Date   WBC 4.8 07/13/2013   HGB 12.8* 07/13/2013   HCT 37.3* 07/13/2013   MCV 95.8 07/13/2013   PLT 193.0 07/13/2013    Past Medical History  Diagnosis Date  . Prostatic hypertrophy   . Cancer     skin cancer- R Buttocks-bx was postive - pt not sure what kind of cancer- for removeal in March  . Arthritis     Knee  . Rotator cuff disorder     BIL  . Colon polyps     TWO HYPERPLASTIC POLYPS  . Unspecified essential hypertension 05/26/2013     Allergies  Allergen Reactions  . Doxycycline     Loss of balance & fatigue      Current Outpatient Prescriptions on File Prior to Visit  Medication Sig Dispense Refill  . aspirin (ASPIRIN CHILDRENS) 81 MG chewable tablet Chew 1 tablet (81 mg total) by mouth daily.  30 tablet  0  . cholecalciferol (VITAMIN D) 1000 UNITS tablet Take 1,000 Units by mouth daily.      . finasteride (PROSCAR) 5 MG tablet Take 5 mg by mouth daily.       . fluticasone (FLONASE) 50 MCG/ACT nasal spray Place 2 sprays into the nose daily.      . Polyethylene Glycol 3350 (EQ CLEARLAX PO) Take by mouth. 1 capful at night      . Probiotic Product (PROBIOTIC DAILY PO) Take by mouth.      . Probiotic Product (RESTORA) CAPS Take one capsule once daily  10 capsule  0  . Tamsulosin HCl (FLOMAX) 0.4 MG CAPS Take 0.4 mg by mouth daily.       No current facility-administered medications on file  prior to visit.    ROS: all negative expect above.   Physical: Filed Weights   07/16/13 1552  Weight: 177 lb (80.287 kg)   BP 110/78  Pulse 72  Temp(Src) 97.9 F (36.6 C)  Resp 16  Wt 177 lb (80.287 kg) General Appearance: Well nourished, in no apparent distress. Eyes: PERRLA, EOMs. Sinuses: No Frontal/maxillary tenderness ENT/Mouth: Ext aud canals clear, normal light reflex with TMs without erythema, bulging. Post pharynx without erythema, swelling, exudate.  Respiratory: CTAB Cardio: RRR, no murmurs, rubs or gallops. Peripheral pulses brisk and equal bilaterally, without edema. No aortic or femoral bruits. Abdomen: Soft, with bowl sounds. Nontender, no guarding, rebound. Lymphatics: Non tender without lymphadenopathy.  Musculoskeletal: Full ROM all peripheral extremities, 5/5 strength, and normal gait. Skin: Warm, dry without rashes, lesions, ecchymosis.  Neuro: Cranial nerves intact, reflexes equal bilaterally. Normal muscle tone, no cerebellar symptoms. Sensation intact.  Pysch: Awake and oriented X 3, normal affect, Insight and Judgment appropriate.   Assessment and Plan: Hyponatremia- recheck BMP ?Melena- check CBC, iron and follow up with Dr. Debara Pickett.  Anxiety/Depression- can try Wellbutrin XL  150 QD #30 2 RF Diazepam 5mg  #20 NR.

## 2013-07-30 ENCOUNTER — Encounter: Payer: Self-pay | Admitting: Internal Medicine

## 2013-07-30 ENCOUNTER — Ambulatory Visit (INDEPENDENT_AMBULATORY_CARE_PROVIDER_SITE_OTHER): Payer: Medicare HMO | Admitting: Internal Medicine

## 2013-07-30 VITALS — BP 116/76 | HR 76 | Temp 97.7°F | Resp 16 | Ht 71.0 in | Wt 178.8 lb

## 2013-07-30 DIAGNOSIS — I10 Essential (primary) hypertension: Secondary | ICD-10-CM

## 2013-07-30 NOTE — Progress Notes (Signed)
Subjective:    Patient ID: Shawn Gaines, male    DOB: [birth year], 73 y.o.   MRN: [id number]  HPI Patient present today with a variety of reporting pains in his feet & toes . He also has seen Dr Tamera Punt recently for Lt shoulder Rotator cuff injury. He has a list of BP's from PT at Ortho office showing a wide range of high and low BP's and normal today. He is felt to have labile HTH warranting close observation. He denies any CV or HT Sx's.    Medication List   aspirin 81 MG chewable tablet  Commonly known as:  ASPIRIN CHILDRENS  Chew 1 tablet (81 mg total) by mouth daily.     buPROPion 150 MG 24 hr tablet  Commonly known as:  WELLBUTRIN XL  Take 1 tablet (150 mg total) by mouth every morning.     cholecalciferol 1000 UNITS tablet  Commonly known as:  VITAMIN D  Take 1,000 Units by mouth daily.     diazepam 5 MG tablet  Commonly known as:  VALIUM  Take 1 tablet (5 mg total) by mouth every 8 (eight) hours as needed for anxiety.     finasteride 5 MG tablet  Commonly known as:  PROSCAR  Take 5 mg by mouth daily.     fluticasone 50 MCG/ACT nasal spray  Commonly known as:  FLONASE  Place 2 sprays into the nose daily.     Magnesium 250 MG Tabs  Take by mouth daily.     POTASSIMIN PO  Take 99 mg by mouth 3 (three) times daily.     PROBIOTIC DAILY PO  Take by mouth.     RESTORA Caps  Take one capsule once daily     tamsulosin 0.4 MG Caps capsule  Commonly known as:  FLOMAX  Take 0.4 mg by mouth daily.       Allergies  Allergen Reactions  . Doxycycline     Loss of balance & fatigue   Past Medical History  Diagnosis Date  . Prostatic hypertrophy   . Cancer     skin cancer- R Buttocks-bx was postive - pt not sure what kind of cancer- for removeal in March  . Arthritis     Knee  . Rotator cuff disorder     BIL  . Colon polyps     TWO HYPERPLASTIC POLYPS  . Unspecified essential hypertension 05/26/2013   Review of Systems In addition to the HPI above,  No  Fever-chills,  No Headache, No changes with Vision or hearing,  No problems swallowing food or Liquids,  No Chest pain or productive Cough or Shortness of Breath,  No Abdominal pain, No Nausea or Vommitting, Bowel movements are regular,  No Blood in stool or Urine,  No dysuria,  No new skin rashes or bruises,  No new weakness, tingling, numbness in any extremity,  No recent weight loss,  No polyuria, polydypsia or polyphagia,  No significant Mental Stressors.  A full 10 point Review of Systems was done, except as stated above, all other Review of Systems were negative  Objective:   Physical Exam  BP 116/76  Pulse 76  Temp(Src) 97.7 F (36.5 C) (Temporal)  Resp 16  Ht 5\' 11"  (1.803 m)  Wt 178 lb 12.8 oz (81.103 kg)  BMI 24.95 kg/m2 HEENT - Eac's patent. TM's Nl.EOM's full. PERRLA. NasoOroPharynx clear. Neck - supple. Nl Thyroid. No bruits nodes JVD Chest - Clear equal BS Cor - Nl HS.  RRR w/o sig MGR. PP 1(+) No edema. Abd - No palpable organomegaly, masses or tenderness. BS nl. MS- FROM. w/o deformities. Muscle power tone and bulk Nl. Gait Nl. Neuro - No obvious Cr N abnormalities. Sensory, motor and Cerebellar functions appear Nl w/o focal abnormalities.  Assessment & Plan:   1- Labile HTN   2- Chronic Anxiety  Discussed BP monitoring and Med effects & SE's

## 2013-07-31 ENCOUNTER — Ambulatory Visit: Payer: Self-pay | Admitting: Internal Medicine

## 2013-08-03 ENCOUNTER — Ambulatory Visit: Payer: Self-pay | Admitting: Internal Medicine

## 2013-08-19 ENCOUNTER — Ambulatory Visit: Payer: Medicare HMO | Admitting: Internal Medicine

## 2013-08-21 ENCOUNTER — Telehealth: Payer: Self-pay

## 2013-08-21 NOTE — Telephone Encounter (Signed)
Patient called and asked if he could take Wellbutrin every other day because he thinks it interferes with other medications. Per Dr.McKeown, patient can take Wellbutrin every other day or can try taking 1/2 tablet daily. Patient aware.

## 2013-09-02 ENCOUNTER — Encounter: Payer: Self-pay | Admitting: Internal Medicine

## 2013-09-08 ENCOUNTER — Ambulatory Visit: Payer: Medicare HMO | Admitting: Internal Medicine

## 2013-09-08 ENCOUNTER — Telehealth: Payer: Self-pay | Admitting: Internal Medicine

## 2013-09-08 NOTE — Telephone Encounter (Signed)
No charge. 

## 2013-09-10 ENCOUNTER — Ambulatory Visit: Payer: Self-pay | Admitting: Internal Medicine

## 2013-09-15 ENCOUNTER — Encounter: Payer: Self-pay | Admitting: Internal Medicine

## 2013-12-10 ENCOUNTER — Ambulatory Visit: Payer: Self-pay | Admitting: Internal Medicine

## 2013-12-29 ENCOUNTER — Encounter: Payer: Self-pay | Admitting: Internal Medicine

## 2013-12-29 ENCOUNTER — Ambulatory Visit (INDEPENDENT_AMBULATORY_CARE_PROVIDER_SITE_OTHER): Payer: Medicare HMO | Admitting: Internal Medicine

## 2013-12-29 VITALS — BP 144/70 | HR 80 | Temp 98.2°F | Resp 18 | Ht 71.0 in | Wt 192.0 lb

## 2013-12-29 DIAGNOSIS — E559 Vitamin D deficiency, unspecified: Secondary | ICD-10-CM

## 2013-12-29 DIAGNOSIS — I10 Essential (primary) hypertension: Secondary | ICD-10-CM

## 2013-12-29 DIAGNOSIS — Z79899 Other long term (current) drug therapy: Secondary | ICD-10-CM

## 2013-12-29 DIAGNOSIS — R7309 Other abnormal glucose: Secondary | ICD-10-CM

## 2013-12-29 DIAGNOSIS — E782 Mixed hyperlipidemia: Secondary | ICD-10-CM

## 2013-12-29 DIAGNOSIS — R7303 Prediabetes: Secondary | ICD-10-CM

## 2013-12-29 NOTE — Patient Instructions (Signed)

## 2013-12-30 ENCOUNTER — Telehealth: Payer: Self-pay | Admitting: *Deleted

## 2013-12-30 ENCOUNTER — Encounter: Payer: Self-pay | Admitting: Internal Medicine

## 2013-12-30 LAB — MAGNESIUM: Magnesium: 2 mg/dL (ref 1.5–2.5)

## 2013-12-30 LAB — CBC WITH DIFFERENTIAL/PLATELET
BASOS ABS: 0 10*3/uL (ref 0.0–0.1)
Basophils Relative: 1 % (ref 0–1)
EOS ABS: 0.2 10*3/uL (ref 0.0–0.7)
Eosinophils Relative: 5 % (ref 0–5)
HEMATOCRIT: 37.6 % — AB (ref 39.0–52.0)
Hemoglobin: 13.2 g/dL (ref 13.0–17.0)
Lymphocytes Relative: 22 % (ref 12–46)
Lymphs Abs: 0.9 10*3/uL (ref 0.7–4.0)
MCH: 32.7 pg (ref 26.0–34.0)
MCHC: 35.1 g/dL (ref 30.0–36.0)
MCV: 93.1 fL (ref 78.0–100.0)
MONO ABS: 0.4 10*3/uL (ref 0.1–1.0)
Monocytes Relative: 10 % (ref 3–12)
Neutro Abs: 2.7 10*3/uL (ref 1.7–7.7)
Neutrophils Relative %: 62 % (ref 43–77)
Platelets: 232 10*3/uL (ref 150–400)
RBC: 4.04 MIL/uL — ABNORMAL LOW (ref 4.22–5.81)
RDW: 12.9 % (ref 11.5–15.5)
WBC: 4.3 10*3/uL (ref 4.0–10.5)

## 2013-12-30 LAB — BASIC METABOLIC PANEL WITH GFR
BUN: 13 mg/dL (ref 6–23)
CO2: 20 mEq/L (ref 19–32)
Calcium: 8.4 mg/dL (ref 8.4–10.5)
Chloride: 103 mEq/L (ref 96–112)
Creat: 0.76 mg/dL (ref 0.50–1.35)
GFR, Est African American: >89 mL/min
GFR, Est Non African American: >89 mL/min
Glucose, Bld: 87 mg/dL (ref 70–99)
Potassium: 4.4 mEq/L (ref 3.5–5.3)
Sodium: 134 mEq/L — ABNORMAL LOW (ref 135–145)

## 2013-12-30 LAB — HEMOGLOBIN A1C
Hgb A1c MFr Bld: 6 % — ABNORMAL HIGH (ref <5.7)
MEAN PLASMA GLUCOSE: 126 mg/dL — AB (ref <117)

## 2013-12-30 LAB — LIPID PANEL
Cholesterol: 161 mg/dL (ref 0–200)
HDL: 34 mg/dL — ABNORMAL LOW (ref >39)
LDL Cholesterol: 97 mg/dL (ref 0–99)
Total CHOL/HDL Ratio: 4.7 Ratio
Triglycerides: 148 mg/dL (ref <150)
VLDL: 30 mg/dL (ref 0–40)

## 2013-12-30 LAB — TSH: TSH: 2.109 u[IU]/mL (ref 0.350–4.500)

## 2013-12-30 LAB — VITAMIN D 25 HYDROXY (VIT D DEFICIENCY, FRACTURES): Vit D, 25-Hydroxy: 27 ng/mL — ABNORMAL LOW (ref 30–89)

## 2013-12-30 LAB — HEPATIC FUNCTION PANEL
ALT: 16 U/L (ref 0–53)
AST: 18 U/L (ref 0–37)
Albumin: 3.9 g/dL (ref 3.5–5.2)
Alkaline Phosphatase: 54 U/L (ref 39–117)
Bilirubin, Direct: 0.1 mg/dL (ref 0.0–0.3)
Indirect Bilirubin: 0.4 mg/dL (ref 0.2–1.2)
Total Bilirubin: 0.5 mg/dL (ref 0.2–1.2)
Total Protein: 6.2 g/dL (ref 6.0–8.3)

## 2013-12-30 LAB — INSULIN, FASTING: Insulin fasting, serum: 10 u[IU]/mL (ref 2.0–19.6)

## 2013-12-30 NOTE — Progress Notes (Signed)
Patient ID: Shawn Gaines, male   DOB: 1940-11-08, 73 y.o.   MRN: [id number]   This very nice 73 y.o.male presents for 3 month follow up with Hypertension, Hyperlipidemia, Pre-Diabetes and Vitamin D Deficiency. Patient's main complaints seem to center around his inability to accept the breakup of his marriage with c/o ongoing anxiety and insomnia. Apparently he's not taken his Celexa and Wellbutrin nor his Diazepam.    Patient is expectantly monitored for labile HTN & BP has been controlled with today's BP 144/70 sl elevated. Patient has had no complaints of any cardiac type chest pain, palpitations, dyspnea/orthopnea/PND, dizziness, claudication, or dependent edema.   Hyperlipidemia is controlled with diet & meds. Patient denies myalgias or other med SE's. Last Lipids were Total Chol 157; HDL  34; LDL101; Trig 111 on 05/26/2013.   Also, the patient has history of  PreDiabetes and has had no symptoms of reactive hypoglycemia, diabetic polys, paresthesias or visual blurring.  Last A1c was 05/26/2013: Hemoglobin-A1c 6.0% on     Further, the patient also has history of Vitamin D Deficiency and supplements vitamin D without any suspected side-effects. Last vitamin D was  19 on 05/26/2013.   Medication List   aspirin 81 MG chewable tablet  Commonly known as:  ASPIRIN CHILDRENS  Chew 1 tablet (81 mg total) by mouth daily.     buPROPion 150 MG 24 hr tablet  Commonly known as:  WELLBUTRIN XL  Take 1 tablet (150 mg total) by mouth every morning.     cholecalciferol 1000 UNITS tablet  Commonly known as:  VITAMIN D  Take 1,000 Units by mouth daily.     citalopram 20 MG tablet  Commonly known as:  CELEXA     diazepam 5 MG tablet  Commonly known as:  VALIUM  Take 1 tablet (5 mg total) by mouth every 8 (eight) hours as needed for anxiety.     finasteride 5 MG tablet  Commonly known as:  PROSCAR  Take 5 mg by mouth daily.     fluticasone 50 MCG/ACT nasal spray  Commonly known as:  FLONASE  Place 2  sprays into the nose daily.     Magnesium 250 MG Tabs  Take by mouth daily.     POTASSIMIN PO  Take 99 mg by mouth 3 (three) times daily.     PROBIOTIC DAILY PO  Take by mouth.     tamsulosin 0.4 MG Caps capsule  Commonly known as:  FLOMAX  Take 0.4 mg by mouth daily.     Allergies  Allergen Reactions  . Doxycycline     Loss of balance & fatigue   PMHx:   Past Medical History  Diagnosis Date  . Prostatic hypertrophy   . Cancer     skin cancer- R Buttocks-bx was postive - pt not sure what kind of cancer- for removeal in March  . Arthritis     Knee  . Rotator cuff disorder     BIL  . Colon polyps     TWO HYPERPLASTIC POLYPS  . Unspecified essential hypertension 05/26/2013   Immunization History  Administered Date(s) Administered  . Td 12/08/2009  . Zoster 09/10/2011   Past Surgical History  Procedure Laterality Date  . Tonsillectomy    . Knee arthroplasty  2009    right  . Joint replacement  2009    R Knee  . Total knee arthroplasty  05/04/2011    Procedure: TOTAL KNEE ARTHROPLASTY;  Surgeon: Kerin Salen, MD;  Location:  [facility];  Service: Orthopedics;  Laterality: Left;  DEPUY  . Colonoscopy  2014    normal   FHx:    Reviewed / unchanged  SHx:    Reviewed / unchanged  Systems Review:  Constitutional: Denies fever, chills, wt changes, headaches, insomnia, fatigue, night sweats, change in appetite. Eyes: Denies redness, blurred vision, diplopia, discharge, itchy, watery eyes.  ENT: Denies discharge, congestion, post nasal drip, epistaxis, sore throat, earache, hearing loss, dental pain, tinnitus, vertigo, sinus pain, snoring.  CV: Denies chest pain, palpitations, irregular heartbeat, syncope, dyspnea, diaphoresis, orthopnea, PND, claudication or edema. Respiratory: denies cough, dyspnea, DOE, pleurisy, hoarseness, laryngitis, wheezing.  Gastrointestinal: Denies dysphagia, odynophagia, heartburn, reflux, water brash, abdominal pain or cramps, nausea, vomiting,  bloating, diarrhea, constipation, hematemesis, melena, hematochezia  or hemorrhoids. Genitourinary: Denies dysuria, frequency, urgency, nocturia, hesitancy, discharge, hematuria or flank pain. Musculoskeletal: Denies arthralgias, myalgias, stiffness, jt. swelling, pain, limping or strain/sprain.  Skin: Denies pruritus, rash, hives, warts, acne, eczema or change in skin lesion(s). Neuro: No weakness, tremor, incoordination, spasms, paresthesia or pain. Psychiatric: Denies confusion, memory loss or sensory loss. Endo: Denies change in weight, skin or hair change.  Heme/Lymph: No excessive bleeding, bruising or enlarged lymph nodes. Exam:  BP 144/70  Pulse 80  Tem 98.2 F   Resp 18  Ht 5\' 11"   Wt 192 lb   BMI 26.79  Appears well nourished and in no distress. Eyes: PERRLA, EOMs, conjunctiva no swelling or erythema. Sinuses: No frontal/maxillary tenderness ENT/Mouth: EAC's clear, TM's nl w/o erythema, bulging. Nares clear w/o erythema, swelling, exudates. Oropharynx clear without erythema or exudates. Oral hygiene is good. Tongue normal, non obstructing. Hearing intact.  Neck: Supple. Thyroid nl. Car 2+/2+ without bruits, nodes or JVD. Chest: Respirations nl with BS clear & equal w/o rales, rhonchi, wheezing or stridor.  Cor: Heart sounds normal w/ regular rate and rhythm without sig. murmurs, gallops, clicks, or rubs. Peripheral pulses normal and equal  without edema.  Abdomen: Soft & bowel sounds normal. Non-tender w/o guarding, rebound, hernias, masses, or organomegaly.  Lymphatics: Unremarkable.  Musculoskeletal: Full ROM all peripheral extremities, joint stability, 5/5 strength, and normal gait.  Skin: Warm, dry without exposed rashes, lesions or ecchymosis apparent.  Neuro: Cranial nerves intact, reflexes equal bilaterally. Sensory-motor testing grossly intact. Tendon reflexes grossly intact.  Pysch: Alert & oriented x 3.  Insight and judgement seem very limited wrt his marriage  breakup.   Assessment and Plan:  1. Hypertension - Continue monitor blood pressure at home. Continue diet/meds same.  2. Hyperlipidemia - Continue diet/meds, exercise,& lifestyle modifications. Continue monitor periodic cholesterol/liver & renal functions   3.  Pre-Diabetes - Continue diet, exercise, lifestyle modifications. Monitor appropriate labs.  4. Vitamin D Deficiency - Continue supplementation.   Recommended regular exercise, BP monitoring, weight control, and discussed med and SE's. Recommended labs to assess and monitor clinical status. Further disposition pending results of labs.  Patient was encouraged to restart his psychotropic medications (Welbutrin, Citalopram & prn Diazepam) allowing 3 to 6 weeks for the full benefit of taking the medications. Patient declined referral for counseling.

## 2013-12-30 NOTE — Telephone Encounter (Signed)
Patient was unsure of his Rx list at his 12/29/13.  Brought updated list of all Rx's and vitamins he is taking.  List updated and discussed in detail with patient.

## 2014-01-07 ENCOUNTER — Telehealth: Payer: Self-pay | Admitting: *Deleted

## 2014-01-07 NOTE — Telephone Encounter (Signed)
Patient called back stating he "thinks he may be having a panic attack."  No specific reason or cause other then he is "upset over taking Clindamycin abx from oral surgeon and it is making him feel sick."  I reminded patient that we spoke earlier and he was advised to call oral surgeon for further eval and Tx or possible change of abx.  Patient was advised that he needs to call 911 if he feels he is having an allergic reaction or if he is unable to calm his "panic attack" by taking one of his Valium.  I advised patient of the severity of an allergic reaction to an abx and the necessity to call 911 for safe transport to the hospital.  Advised OV for further eval tomorrow if no relief from symptoms since it's 5:00pm and our office is closed.

## 2014-01-07 NOTE — Telephone Encounter (Signed)
Patient called with c/o feeling "bad" from taking Clindamycin Rx given by oral surgeon.  Advised patient to be sure to eat prior to taking abx and to call oral surgeon for further instruction or for a change in Rx.

## 2014-01-21 ENCOUNTER — Encounter: Payer: Self-pay | Admitting: Internal Medicine

## 2014-01-21 ENCOUNTER — Ambulatory Visit (INDEPENDENT_AMBULATORY_CARE_PROVIDER_SITE_OTHER): Payer: Medicare HMO | Admitting: Internal Medicine

## 2014-01-21 VITALS — BP 120/80 | HR 60 | Temp 98.1°F | Resp 16 | Ht 71.0 in | Wt 192.0 lb

## 2014-01-21 DIAGNOSIS — R05 Cough: Secondary | ICD-10-CM

## 2014-01-21 DIAGNOSIS — R058 Other specified cough: Secondary | ICD-10-CM

## 2014-01-21 DIAGNOSIS — K219 Gastro-esophageal reflux disease without esophagitis: Secondary | ICD-10-CM

## 2014-01-21 MED ORDER — PANTOPRAZOLE SODIUM 40 MG PO TBEC: DELAYED_RELEASE_TABLET | ORAL | Status: DC

## 2014-01-21 NOTE — Progress Notes (Signed)
Subjective:    Patient ID: Shawn Gaines, male    DOB: [birth year], 73 y.o.   MRN: [id number]  HPI Patient reports a 1 year hx/o of a dry hacking tickle cough.which he feel is stress induced from his marital separation of about 1 year that he has been unable to accept. He hdmits taking very occasional and infrequently his prescribed Citalopram and Bupropion. He also has a Rx Diazepam that he takes very infrequently. He denies sinus drainage or water brash type reflux, butt does admit mild hoarseness.   Medication Sig  . aspirin (ASPIRIN CHILDRENS) 81 MG chewable tablet Chew 1 tablet (81 mg total) by mouth daily.  . B Complex-C (SUPER B COMPLEX PO) Take by mouth daily.  Marland Kitchen buPROPion (WELLBUTRIN XL) 150 MG 24 hr tablet Take 1 tablet (150 mg total) by mouth every morning.  . Cholecalciferol (VITAMIN D3) 5000 UNITS TABS Take by mouth daily.  . citalopram (CELEXA) 20 MG tablet   . diazepam (VALIUM) 5 MG tablet Take 1 tablet (5 mg total) by mouth every 8 (eight) hours as needed for anxiety.  . finasteride (PROSCAR) 5 MG tablet Take 5 mg by mouth daily.   . fluticasone (FLONASE) 50 MCG/ACT nasal spray Place 2 sprays into the nose daily.  . Lactobacillus (ACIDOPHILUS PO) Take by mouth daily.  Marland Kitchen loratadine (CLARITIN) 10 MG tablet Take 10 mg by mouth daily.  . Magnesium 250 MG TABS Take 500 mg by mouth daily.   . Multiple Vitamins-Minerals (EMERGEN-C IMMUNE PO) Take by mouth daily.  . Omega-3 Fatty Acids (OMEGA 3 PO) Take by mouth daily.  . Potassium (POTASSIMIN PO) Take 99 mg by mouth 3 (three) times daily.  . Probiotic Product (PROBIOTIC DAILY PO) Take by mouth.  . Tamsulosin HCl (FLOMAX) 0.4 MG CAPS Take 0.4 mg by mouth daily.   Allergies  Allergen Reactions  . Doxycycline     Loss of balance & fatigue   Past Medical History  Diagnosis Date  . Prostatic hypertrophy   . Cancer     skin cancer- R Buttocks-bx was postive - pt not sure what kind of cancer- for removeal in March  . Arthritis    Knee  . Rotator cuff disorder     BIL  . Colon polyps     TWO HYPERPLASTIC POLYPS  . Unspecified essential hypertension 05/26/2013   Review of Systems In addition to the HPI above,  No Fever-chills,  No Headache, No changes with Vision or hearing,  No problems swallowing food or Liquids,  No Chest pain or productive Cough or Shortness of Breath,  No Abdominal pain, No Nausea or Vomitting, Bowel movements are regular,  No Blood in stool or Urine,  No dysuria,  No new skin rashes or bruises,  No new joints pains-aches,  No new weakness, tingling, numbness in any extremity,  No recent weight loss,  No polyuria, polydypsia or polyphagia,  No significant Mental Stressors.  A full 10 point Review of Systems was done, except as stated above, all other Review of Systems were negative    Objective:   Physical Exam  BP 120/80 mmHg  Pulse 60  Temp(Src) 98.1 F (36.7 C)  Resp 16  Ht 5\' 11"  (1.803 m)  Wt 192 lb (87.091 kg)  BMI 26.79 kg/m2  HEENT - Eac's patent. TM's Nl. EOM's full. PERRLA. NasoOroPharynx clear. Neck - supple. Nl Thyroid. Carotids 2+ & No bruits, nodes, JVD Chest - Clear equal BS w/o Rales, rhonchi, wheezes. Cor -  Nl HS. RRR w/o sig MGR. PP 1(+). No edema. Abd - No palpable organomegaly, masses or tenderness. BS nl. MS- FROM w/o deformities. Muscle power, tone and bulk Nl. Gait Nl. Neuro - No obvious Cr N abnormalities. Sensory, motor and Cerebellar functions appear Nl w/o focal abnormalities. Psyche - Mental status normal & appropriate.   Patient does seem obsessed with his Ex-wife and seems unrealistic expecting her to have a change of mind and return to him as he admits that she has it perfectly clear she will not.    Assessment & Plan:   1. Gastroesophageal reflux disease (Suspect this may be etiology for his cough)  - Empiric trial with pantoprazole (PROTONIX) 40 MG tablet; Take 1 tablet daily for acid reflux and cough  Dispense: 30 tablet; Refill: 5  2. Dry  cough

## 2014-01-21 NOTE — Patient Instructions (Addendum)
Please take your Citalopram & Bupropion every day as instructed  Food Choices for Gastroesophageal Reflux Disease When you have gastroesophageal reflux disease (GERD), the foods you eat and your eating habits are very important. Choosing the right foods can help ease the discomfort of GERD. WHAT GENERAL GUIDELINES DO I NEED TO FOLLOW?  Choose fruits, vegetables, whole grains, low-fat dairy products, and low-fat meat, fish, and poultry.  Limit fats such as oils, salad dressings, butter, nuts, and avocado.  Keep a food diary to identify foods that cause symptoms.  Avoid foods that cause reflux. These may be different for different people.  Eat frequent small meals instead of three large meals each day.  Eat your meals slowly, in a relaxed setting.  Limit fried foods.  Cook foods using methods other than frying.  Avoid drinking alcohol.  Avoid drinking large amounts of liquids with your meals.  Avoid bending over or lying down until 2-3 hours after eating. WHAT FOODS ARE NOT RECOMMENDED? The following are some foods and drinks that may worsen your symptoms: Vegetables Tomatoes. Tomato juice. Tomato and spaghetti sauce. Chili peppers. Onion and garlic. Horseradish. Fruits Oranges, grapefruit, and lemon (fruit and juice). Meats High-fat meats, fish, and poultry. This includes hot dogs, ribs, ham, sausage, salami, and bacon. Dairy Whole milk and chocolate milk. Sour cream. Cream. Butter. Ice cream. Cream cheese.  Beverages Coffee and tea, with or without caffeine. Carbonated beverages or energy drinks. Condiments Hot sauce. Barbecue sauce.  Sweets/Desserts Chocolate and cocoa. Donuts. Peppermint and spearmint. Fats and Oils High-fat foods, including [place] fries and potato chips. Other Vinegar. Strong spices, such as black pepper, white pepper, red pepper, cayenne, curry powder, cloves, ginger, and chili powder.  Gastroesophageal Reflux Disease, Adult Gastroesophageal  reflux disease (GERD) happens when acid from your stomach flows up into the esophagus. When acid comes in contact with the esophagus, the acid causes soreness (inflammation) in the esophagus. Over time, GERD may create small holes (ulcers) in the lining of the esophagus. CAUSES   Increased body weight. This puts pressure on the stomach, making acid rise from the stomach into the esophagus.  Smoking. This increases acid production in the stomach.  Drinking alcohol. This causes decreased pressure in the lower esophageal sphincter (valve or ring of muscle between the esophagus and stomach), allowing acid from the stomach into the esophagus.  Late evening meals and a full stomach. This increases pressure and acid production in the stomach.  A malformed lower esophageal sphincter. Sometimes, no cause is found. SYMPTOMS   Burning pain in the lower part of the mid-chest behind the breastbone and in the mid-stomach area. This may occur twice a week or more often.  Trouble swallowing.  Sore throat.  Dry cough.  Asthma-like symptoms including chest tightness, shortness of breath, or wheezing. DIAGNOSIS  Your caregiver may be able to diagnose GERD based on your symptoms. In some cases, X-rays and other tests may be done to check for complications or to check the condition of your stomach and esophagus. TREATMENT  Your caregiver may recommend over-the-counter or prescription medicines to help decrease acid production. Ask your caregiver before starting or adding any new medicines.  HOME CARE INSTRUCTIONS   Change the factors that you can control. Ask your caregiver for guidance concerning weight loss, quitting smoking, and alcohol consumption.  Avoid foods and drinks that make your symptoms worse, such as:  Caffeine or alcoholic drinks.  Chocolate.  Peppermint or mint flavorings.  Garlic and onions.  Spicy foods.  Citrus fruits, such as oranges, lemons, or limes.  Tomato-based foods  such as sauce, chili, salsa, and pizza.  Fried and fatty foods.  Avoid lying down for the 3 hours prior to your bedtime or prior to taking a nap.  Eat small, frequent meals instead of large meals.  Wear loose-fitting clothing. Do not wear anything tight around your waist that causes pressure on your stomach.  Raise the head of your bed 6 to 8 inches with wood blocks to help you sleep. Extra pillows will not help.  Only take over-the-counter or prescription medicines for pain, discomfort, or fever as directed by your caregiver.  Do not take aspirin, ibuprofen, or other nonsteroidal anti-inflammatory drugs (NSAIDs). SEEK IMMEDIATE MEDICAL CARE IF:   You have pain in your arms, neck, jaw, teeth, or back.  Your pain increases or changes in intensity or duration.  You develop nausea, vomiting, or sweating (diaphoresis).  You develop shortness of breath, or you faint.  Your vomit is green, yellow, black, or looks like coffee grounds or blood.  Your stool is red, bloody, or black. These symptoms could be signs of other problems, such as heart disease, gastric bleeding, or esophageal bleeding. MAKE SURE YOU:   Understand these instructions.  Will watch your condition.  Will get help right away if you are not doing well or get worse. Document Released: 11/29/2004 Document Revised: 05/14/2011 Document Reviewed: 09/08/2010 [facility] Patient Information 2015 [place], [place]. This information is not intended to replace advice given to you by your health care provider. Make sure you discuss any questions you have with your health care provider.

## 2014-01-25 ENCOUNTER — Telehealth: Payer: Self-pay | Admitting: *Deleted

## 2014-01-25 NOTE — Telephone Encounter (Signed)
Patient called and asked if Citalopram or any of his other meds could cause him to have trouble sleeping.  None of his meds should cause sleep problems.  Patient aware.

## 2014-02-03 ENCOUNTER — Telehealth: Payer: Self-pay

## 2014-02-03 ENCOUNTER — Other Ambulatory Visit: Payer: Self-pay | Admitting: Physician Assistant

## 2014-02-03 MED ORDER — DIAZEPAM 5 MG PO TABS: 5.0000 mg | ORAL_TABLET | Freq: Three times a day (TID) | ORAL | Status: DC | PRN

## 2014-02-03 NOTE — Telephone Encounter (Signed)
Patient called requesting alternate Rx than Wellbutrin. Patient is having side effects. Per Dr.McKeown, patient should discontinue Wellbutrin and increase Citalopram to 2 tablets daily. Patient aware.

## 2014-02-08 ENCOUNTER — Telehealth: Payer: Self-pay | Admitting: *Deleted

## 2014-02-08 NOTE — Telephone Encounter (Signed)
Patient called and states his Diazepam is causing dizziness and dry mouth.  Patient wants to try Melatonin.  OK to try per Dr Melford Aase and was advised he needs to a psychiatrist for management of his meds.  Patient was given the phone # for Dr Casimiro Needle.

## 2014-02-15 ENCOUNTER — Telehealth: Payer: Self-pay

## 2014-02-15 NOTE — Telephone Encounter (Signed)
Patient is asking if he can take Nyquil with current medications. Per Dr.McKeown, it is ok for patient to take Nyquil. Patient aware.

## 2014-02-23 ENCOUNTER — Telehealth: Payer: Self-pay | Admitting: *Deleted

## 2014-02-23 NOTE — Telephone Encounter (Signed)
Patient called from [place] and asked if OK to stop RX's except Finasteride and tamsulosin.  Per Dr Melford Aase, [place] to stop.

## 2014-03-15 ENCOUNTER — Telehealth: Payer: Self-pay | Admitting: Cardiology

## 2014-03-15 NOTE — Telephone Encounter (Signed)
Patient complained of SOB and lightheadedness on and off. Patient denies any chest pain. Patient states that his SOB gets worse when he is sitting or laying down. Patient does not have cardiac history. Patient was last seen in our office 03/18/13, almost a year ago. Patient had stress test last year, and it was normal with no ischemia. Patient has a precestant dry cough, that he has had for sometime. Patient is non-compliant with most of his medications, including Celexa and Wellbutrin.  Consulted with Dr. Mare Ferrari, and he does not feel this is cardiac related. Dr. Mare Ferrari advised patient to see his primary care doctor. Informed patient of Dr. Sherryl Barters suggestion, at this time patient does not have a primary doctor. Advised patient to go to Urgent Care instead. Patient does not want to go to Urgent Care, and said he would go to ER before he does that. Informed patient if he feels that bad, he should go to ED. Encouraged patient to go the Urgent Care or Primary Care Doctor, but patient insist that he needs to have a stress test and he knows the hospital can give him one. Patient verbalized understanding.

## 2014-03-15 NOTE — Telephone Encounter (Signed)
Pt c/o Shortness Of Breath: STAT if SOB developed within the last 24 hours or pt is noticeably SOB on the phone  1. Are you currently SOB (can you hear that pt is SOB on the phone)? Pt says yes.. But I cannot hear it on the phone.Marland Kitchen He is talking clearliy  2. How long have you been experiencing SOB? On and off 3. Are you SOB when sitting or when up moving around? Sitting mostly 4.  Are you currently experiencing any other symptoms? No other symptoms

## 2014-03-15 NOTE — Telephone Encounter (Signed)
F/U      Pt c/o Shortness Of Breath: STAT if SOB developed within the last 24 hours or pt is noticeably SOB on the phone  1. Are you currently SOB (can you hear that pt is SOB on the phone)? Yes  2. How long have you been experiencing SOB? On and off  3. Are you SOB when sitting or when up moving around? While sitting  4.  Are you currently experiencing any other symptoms? No

## 2014-03-19 ENCOUNTER — Emergency Department (HOSPITAL_COMMUNITY): Payer: Medicare HMO

## 2014-03-19 ENCOUNTER — Emergency Department (HOSPITAL_COMMUNITY)
Admission: EM | Admit: 2014-03-19 | Discharge: 2014-03-19 | Disposition: A | Discharge status: Home / Self Care | Payer: Medicare HMO | Attending: Emergency Medicine | Admitting: Emergency Medicine

## 2014-03-19 DIAGNOSIS — M1990 Unspecified osteoarthritis, unspecified site: Secondary | ICD-10-CM | POA: Diagnosis not present

## 2014-03-19 DIAGNOSIS — Z7951 Long term (current) use of inhaled steroids: Secondary | ICD-10-CM | POA: Diagnosis not present

## 2014-03-19 DIAGNOSIS — Z87448 Personal history of other diseases of urinary system: Secondary | ICD-10-CM | POA: Diagnosis not present

## 2014-03-19 DIAGNOSIS — R05 Cough: Secondary | ICD-10-CM | POA: Diagnosis present

## 2014-03-19 DIAGNOSIS — Z79899 Other long term (current) drug therapy: Secondary | ICD-10-CM | POA: Diagnosis not present

## 2014-03-19 DIAGNOSIS — Z7982 Long term (current) use of aspirin: Secondary | ICD-10-CM | POA: Insufficient documentation

## 2014-03-19 DIAGNOSIS — R42 Dizziness and giddiness: Secondary | ICD-10-CM | POA: Diagnosis not present

## 2014-03-19 DIAGNOSIS — I10 Essential (primary) hypertension: Secondary | ICD-10-CM | POA: Diagnosis not present

## 2014-03-19 DIAGNOSIS — Z85828 Personal history of other malignant neoplasm of skin: Secondary | ICD-10-CM | POA: Diagnosis not present

## 2014-03-19 DIAGNOSIS — J069 Acute upper respiratory infection, unspecified: Secondary | ICD-10-CM | POA: Insufficient documentation

## 2014-03-19 DIAGNOSIS — Z87891 Personal history of nicotine dependence: Secondary | ICD-10-CM | POA: Diagnosis not present

## 2014-03-19 DIAGNOSIS — J40 Bronchitis, not specified as acute or chronic: Secondary | ICD-10-CM | POA: Insufficient documentation

## 2014-03-19 DIAGNOSIS — R0602 Shortness of breath: Secondary | ICD-10-CM

## 2014-03-19 DIAGNOSIS — Z86010 Personal history of colonic polyps: Secondary | ICD-10-CM | POA: Diagnosis not present

## 2014-03-19 LAB — CBC WITH DIFFERENTIAL/PLATELET
BASOS ABS: 0 10*3/uL (ref 0.0–0.1)
Basophils Relative: 1 % (ref 0–1)
EOS ABS: 0.4 10*3/uL (ref 0.0–0.7)
Eosinophils Relative: 8 % — ABNORMAL HIGH (ref 0–5)
HEMATOCRIT: 38.4 % — AB (ref 39.0–52.0)
Hemoglobin: 13.5 g/dL (ref 13.0–17.0)
LYMPHS ABS: 1.2 10*3/uL (ref 0.7–4.0)
LYMPHS PCT: 23 % (ref 12–46)
MCH: 32.8 pg (ref 26.0–34.0)
MCHC: 35.2 g/dL (ref 30.0–36.0)
MCV: 93.2 fL (ref 78.0–100.0)
MONO ABS: 0.5 10*3/uL (ref 0.1–1.0)
Monocytes Relative: 10 % (ref 3–12)
NEUTROS ABS: 3 10*3/uL (ref 1.7–7.7)
Neutrophils Relative %: 58 % (ref 43–77)
Platelets: 212 10*3/uL (ref 150–400)
RBC: 4.12 MIL/uL — ABNORMAL LOW (ref 4.22–5.81)
RDW: 12.1 % (ref 11.5–15.5)
WBC: 5 10*3/uL (ref 4.0–10.5)

## 2014-03-19 LAB — COMPREHENSIVE METABOLIC PANEL
ALBUMIN: 4.2 g/dL (ref 3.5–5.2)
ALT: 18 U/L (ref 0–53)
AST: 19 U/L (ref 0–37)
Alkaline Phosphatase: 56 U/L (ref 39–117)
Anion gap: 7 (ref 5–15)
BUN: 13 mg/dL (ref 6–23)
CALCIUM: 8.4 mg/dL (ref 8.4–10.5)
CO2: 26 mmol/L (ref 19–32)
Chloride: 93 mEq/L — ABNORMAL LOW (ref 96–112)
Creatinine, Ser: 0.8 mg/dL (ref 0.50–1.35)
GFR calc non Af Amer: 86 mL/min — ABNORMAL LOW (ref >90)
GLUCOSE: 100 mg/dL — AB (ref 70–99)
Potassium: 4 mmol/L (ref 3.5–5.1)
SODIUM: 126 mmol/L — AB (ref 135–145)
Total Bilirubin: 0.8 mg/dL (ref 0.3–1.2)
Total Protein: 6.8 g/dL (ref 6.0–8.3)

## 2014-03-19 LAB — BRAIN NATRIURETIC PEPTIDE: B Natriuretic Peptide: 93.4 pg/mL (ref 0.0–100.0)

## 2014-03-19 LAB — TROPONIN I

## 2014-03-19 MED ORDER — PREDNISONE 50 MG PO TABS
50.0000 mg | ORAL_TABLET | Freq: Every day | ORAL | Status: DC
Start: 2014-03-19 — End: 2014-04-01

## 2014-03-19 MED ORDER — ALBUTEROL SULFATE HFA 108 (90 BASE) MCG/ACT IN AERS
2.0000 | INHALATION_SPRAY | RESPIRATORY_TRACT | Status: DC | PRN
  Administered 2014-03-19: 2 via RESPIRATORY_TRACT
  Filled 2014-03-19: qty 6.7

## 2014-03-19 MED ORDER — ALBUTEROL SULFATE (2.5 MG/3ML) 0.083% IN NEBU
5.0000 mg | INHALATION_SOLUTION | Freq: Once | RESPIRATORY_TRACT | Status: AC
  Administered 2014-03-19: 5 mg via RESPIRATORY_TRACT
  Filled 2014-03-19: qty 6

## 2014-03-19 NOTE — ED Provider Notes (Signed)
CSN: [id number]     Arrival date & time 03/19/14  1627 History   First MD Initiated Contact with Patient 03/19/14 1650     Chief Complaint  Patient presents with  . URI  . Cough  . Dizziness     (Consider location/radiation/quality/duration/timing/severity/associated sxs/prior Treatment) HPI  Pt presnting with c/o cough and congestion- he states he has been having these symptoms over the past 2 weeks.  He states that today he has been feeling more weak and tired than usual.  No fever/chills.  No vomiting or diarrhea.  No chest pain or difficulty breathing.  No diaphoresis.  No lightheadedness or vertigo.  Symptoms are constant.  He has not had any treatment prior to arrival.  There are no other associated systemic symptoms, there are no other alleviating or modifying factors.   Past Medical History  Diagnosis Date  . Prostatic hypertrophy   . Cancer     skin cancer- R Buttocks-bx was postive - pt not sure what kind of cancer- for removeal in March  . Arthritis     Knee  . Rotator cuff disorder     BIL  . Colon polyps     TWO HYPERPLASTIC POLYPS  . Unspecified essential hypertension 05/26/2013   Past Surgical History  Procedure Laterality Date  . Tonsillectomy    . Knee arthroplasty  2009    right  . Joint replacement  2009    R Knee  . Total knee arthroplasty  05/04/2011    Procedure: TOTAL KNEE ARTHROPLASTY;  Surgeon: Kerin Salen, MD;  Location: [facility];  Service: Orthopedics;  Laterality: Left;  DEPUY  . Colonoscopy  2014    normal   Family History  Problem Relation Age of Onset  . Anesthesia problems Neg Hx   . Colon cancer Neg Hx    History  Substance Use Topics  . Smoking status: Former Smoker -- 5 years    Types: Pipe    Quit date: 05/04/2006  . Smokeless tobacco: Never Used     Comment: Cigs in teens--occassional "tried"---Smoked a pipe x 4-5 years.  . Alcohol Use: 1.5 oz/week    3 drink(s) per week    Review of Systems  ROS reviewed and all otherwise  negative except for mentioned in HPI    Allergies  Doxycycline  Home Medications   Prior to Admission medications   Medication Sig Start Date End Date Taking? Authorizing Provider  aspirin (ASPIRIN CHILDRENS) 81 MG chewable tablet Chew 1 tablet (81 mg total) by mouth daily. 03/10/13  Yes Lauren Parker, PA-C  B Complex-C (SUPER B COMPLEX PO) Take 1 tablet by mouth daily.    Yes Historical Provider, MD  Cholecalciferol (VITAMIN D3) 5000 UNITS TABS Take 1 tablet by mouth daily.    Yes Historical Provider, MD  finasteride (PROSCAR) 5 MG tablet Take 5 mg by mouth daily.  02/04/13  Yes Historical Provider, MD  fluticasone (FLONASE) 50 MCG/ACT nasal spray Place 2 sprays into the nose daily. 05/23/12  Yes Laurey Morale, MD  loratadine (CLARITIN) 10 MG tablet Take 10 mg by mouth daily as needed for allergies (allergies).    Yes Historical Provider, MD  Magnesium 250 MG TABS Take 500 mg by mouth daily.    Yes Historical Provider, MD  Omega-3 Fatty Acids (OMEGA 3 PO) Take 1 capsule by mouth daily.    Yes Historical Provider, MD  pantoprazole (PROTONIX) 40 MG tablet Take 1 tablet daily for acid reflux and cough 01/21/14  01/22/15 Yes Unk Pinto, MD  Probiotic Product (PROBIOTIC DAILY PO) Take 1 tablet by mouth daily.    Yes Historical Provider, MD  Tamsulosin HCl (FLOMAX) 0.4 MG CAPS Take 0.4 mg by mouth daily. 01/22/12  Yes Laurey Morale, MD  buPROPion (WELLBUTRIN XL) 150 MG 24 hr tablet Take 1 tablet (150 mg total) by mouth every morning. Patient not taking: Reported on 03/19/2014 07/16/13 07/16/14  Vicie Mutters, PA-C  diazepam (VALIUM) 5 MG tablet Take 1 tablet (5 mg total) by mouth every 8 (eight) hours as needed for anxiety. 02/03/14 02/03/15  Vicie Mutters, PA-C  predniSONE (DELTASONE) 50 MG tablet Take 1 tablet (50 mg total) by mouth daily. 03/19/14   Threasa Beards, MD   BP 134/82 mmHg  Pulse 81  Temp(Src) 97.8 F (36.6 C) (Oral)  Resp 16  SpO2 97%  Vitals reviewed Physical Exam   Physical Examination: General appearance - alert, well appearing, and in no distress Mental status - alert, oriented to person, place, and time Eyes - no conjunctival injection, no scleral icterus Mouth - mucous membranes moist, pharynx normal without lesions Chest - clear to auscultation, no wheezes, rales or rhonchi, symmetric air entry, normal respiratory effort Heart - normal rate, regular rhythm, normal S1, S2, no murmurs, rubs, clicks or gallops Abdomen - soft, nontender, nondistended, no masses or organomegaly Extremities - peripheral pulses normal, no pedal edema, no clubbing or cyanosis Skin - normal coloration and turgor, no rashes  ED Course  Procedures (including critical care time) Labs Review Labs Reviewed  CBC WITH DIFFERENTIAL - Abnormal; Notable for the following:    RBC 4.12 (*)    HCT 38.4 (*)    Eosinophils Relative 8 (*)    All other components within normal limits  COMPREHENSIVE METABOLIC PANEL - Abnormal; Notable for the following:    Sodium 126 (*)    Chloride 93 (*)    Glucose, Bld 100 (*)    GFR calc non Af Amer 86 (*)    All other components within normal limits  BRAIN NATRIURETIC PEPTIDE  TROPONIN I    Imaging Review Dg Chest 2 View  03/19/2014   CLINICAL DATA:  Shortness of breath, dizziness, weakness and cough for 2 months.  EXAM: CHEST  2 VIEW  COMPARISON:  PA and lateral chest 03/10/2013 and 05/24/2012.  FINDINGS: The chest is hyperexpanded but the lungs are clear. Heart size is normal. No pneumothorax or pleural effusion. No focal bony abnormality with degenerative disease about the right shoulder noted.  IMPRESSION: No acute disease.  Hyperexpansion is compatible with emphysema.   Electronically Signed   By: Inge Rise M.D.   On: 03/19/2014 17:53     EKG Interpretation   Date/Time:  Friday March 19 2014 16:41:41 EST Ventricular Rate:  75 PR Interval:  146 QRS Duration: 85 QT Interval:  387 QTC Calculation: 432 R Axis:   45 Text  Interpretation:  Sinus or ectopic atrial rhythm Probable lateral  infarct, age indeterminate No significant change since last tracing  Confirmed by [facility]  MD, [place] [phone]) on 03/20/2014 3:37:30 PM      MDM   Final diagnoses:  Bronchitis   Pt presenting with cough and congestion with generalized weakness.  CXR shows no pneumonia.   Xray images reviewed and interpreted by me as well.  Pt treated with albuerol neb in the ED- he feels some improvement.  Will discharge with a course of steroids as well and to use scheduled albuterol at home.  Likely bronchitis.  Doubt ACS, CHF, PE or other acute emergent condition at this time.  Discharged with strict return precautions.  Pt agreeable with plan.      Threasa Beards, MD 03/20/14 1540

## 2014-03-19 NOTE — Discharge Instructions (Signed)
Return to the ED with any concerns including difficulty breathing despite using albuterol2 puffs  every 4 hours, not drinking fluids, decreased urine output, vomiting and not able to keep down liquids or medications, decreased level of alertness/lethargy, or any other alarming symptoms °

## 2014-03-19 NOTE — ED Notes (Signed)
URI x 2 weeks, cough congestion, today not feeling well since 2 pm dizziness, off balance, just not feeling well, denies any change in medications states that usally he is healthy, denies any chest pain,

## 2014-04-02 ENCOUNTER — Encounter: Payer: Self-pay | Admitting: Internal Medicine

## 2014-04-02 ENCOUNTER — Ambulatory Visit (INDEPENDENT_AMBULATORY_CARE_PROVIDER_SITE_OTHER): Payer: Medicare HMO | Admitting: Internal Medicine

## 2014-04-02 VITALS — BP 134/78 | HR 74 | Temp 97.4°F | Resp 10 | Ht 70.0 in | Wt 189.0 lb

## 2014-04-02 DIAGNOSIS — M25579 Pain in unspecified ankle and joints of unspecified foot: Secondary | ICD-10-CM

## 2014-04-02 DIAGNOSIS — F329 Major depressive disorder, single episode, unspecified: Secondary | ICD-10-CM

## 2014-04-02 DIAGNOSIS — M150 Primary generalized (osteo)arthritis: Secondary | ICD-10-CM

## 2014-04-02 DIAGNOSIS — N400 Enlarged prostate without lower urinary tract symptoms: Secondary | ICD-10-CM

## 2014-04-02 DIAGNOSIS — R5383 Other fatigue: Secondary | ICD-10-CM

## 2014-04-02 DIAGNOSIS — M159 Polyosteoarthritis, unspecified: Secondary | ICD-10-CM

## 2014-04-02 DIAGNOSIS — F411 Generalized anxiety disorder: Secondary | ICD-10-CM

## 2014-04-02 DIAGNOSIS — E782 Mixed hyperlipidemia: Secondary | ICD-10-CM

## 2014-04-02 DIAGNOSIS — E871 Hypo-osmolality and hyponatremia: Secondary | ICD-10-CM

## 2014-04-02 DIAGNOSIS — F32A Depression, unspecified: Secondary | ICD-10-CM

## 2014-04-02 DIAGNOSIS — I10 Essential (primary) hypertension: Secondary | ICD-10-CM

## 2014-04-02 DIAGNOSIS — R0600 Dyspnea, unspecified: Secondary | ICD-10-CM

## 2014-04-02 DIAGNOSIS — F4321 Adjustment disorder with depressed mood: Secondary | ICD-10-CM

## 2014-04-02 NOTE — Patient Instructions (Signed)
Continue chiropracter care as scheduled  F/u with urology as scheduled  Recommend counseling for depression  F/u in 3 mos

## 2014-04-02 NOTE — Progress Notes (Signed)
Patient ID: Shawn Gaines, male   DOB: October 04, 1940, 74 y.o.   MRN: [id number]    Facility  [facility]      Place of Service:   OFFICE   Allergies  Allergen Reactions  . Doxycycline     Loss of balance & fatigue    Chief Complaint  Patient presents with  . Establish Care    New patient establish care: Easily fatigued x 1 month- patient gets tired after making bed  . Arm Problem    Rotator cuff concerns on both arms   . Depression    Related to recent divorce after 62 years of marriage     HPI:  74 yo male seen today for above. He c/o fatigue. He is separated from spouse after 33 yrs and is in process of a divorce. Has anhedonia, feeling sad, depressed, feels guilty.  No CP, sleeping disturbance or change in appetite. No weight loss but did gain weight due to not exercising. No SI/HI.   (+) SOB with exertion. Uses HFA prn which helps. He has a cough with sputum pdtn.  He is c/a b/l shoulder pain that worsened with PT. He sees a Art therapist once per month. Now has difficulty lifting objects.   He works PT in [place] at the [employer] at [employer] center  Active Ambulatory Problems    Diagnosis Date Noted  . B12 DEFICIENCY 06/27/2007  . Vitamin D deficiency 06/27/2007  . Anxiety state 10/07/2006  . HYDROCELE 08/18/2008  . Osteoarthritis 03/25/2007  . BENIGN PROSTATIC HYPERTROPHY, HX OF 10/07/2006  . Nocturnal dyspnea 03/19/2013  . Essential hypertension 05/26/2013  . Hyperlipidemia 05/26/2013  . Encounter for long-term (current) use of other medications 05/26/2013  . Prediabetes 07/07/2013  . Fatigue 07/16/2013  . IBS (irritable bowel syndrome) 07/16/2013  . Medication management 12/29/2013  . Pain in joint, ankle and foot 04/02/2014  . Dyspnea 04/02/2014  . BPH (benign prostatic hyperplasia) 04/02/2014  . Hyponatremia 04/02/2014   Resolved Ambulatory Problems    Diagnosis Date Noted  . BENIGN PROSTATIC HYPERTROPHY 12/08/2009   Past Medical History    Diagnosis Date  . Prostatic hypertrophy   . Cancer   . Arthritis   . Rotator cuff disorder   . Colon polyps   . Unspecified essential hypertension 05/26/2013  . Sinus trouble   . Breathing problem    Past Surgical History  Procedure Laterality Date  . Tonsillectomy    . Knee arthroplasty  2009    right  . Joint replacement  2009    R Knee  . Total knee arthroplasty  05/04/2011    Procedure: TOTAL KNEE ARTHROPLASTY;  Surgeon: Kerin Salen, MD;  Location: [place];  Service: Orthopedics;  Laterality: Left;  DEPUY  . Colonoscopy  2014    normal   Family History  Problem Relation Age of Onset  . Anesthesia problems Neg Hx   . Colon cancer Neg Hx   . Arthritis Mother    History   Social History  . Marital Status: Married    Spouse Name: N/A    Number of Children: 1  . Years of Education: N/A   Occupational History  . Retired    Social History Main Topics  . Smoking status: Former Smoker -- 5 years    Types: Pipe  . Smokeless tobacco: Never Used     Comment: Cigs in teens--occassional "tried"---Smoked a pipe x 4-5 years.  . Alcohol Use: 0.6 - 1.2 oz/week  1-2 Not specified per week  . Drug Use: No  . Sexual Activity: Not on file   Other Topics Concern  . 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   B COMPLEX-C (SUPER B COMPLEX PO)    Take 1 tablet by mouth daily.    CHOLECALCIFEROL (VITAMIN D3) 5000 UNITS TABS    Take 1 tablet by mouth daily.    FINASTERIDE (PROSCAR) 5 MG TABLET    Take 5 mg by mouth daily.    FLUTICASONE (FLONASE) 50 MCG/ACT NASAL SPRAY    Place 2 sprays into the nose daily.    LACTOBACILLUS ACID-PECTIN (ACIDOPHILUS PLUS PECTIN) TABS    Take by mouth daily.    MAGNESIUM 250 MG TABS    Take 250 mg by mouth daily.    OMEGA-3 FATTY ACIDS (OMEGA 3 PO)    Take 1 capsule by mouth daily. 1000 mg, 300 mg Omega   PANTOPRAZOLE (PROTONIX) 40 MG TABLET    Take 1 tablet daily for acid reflux and cough   TAMSULOSIN HCL (FLOMAX) 0.4 MG CAPS    Take 0.4 mg by mouth daily.  Modified Medications   No medications on file  Discontinued Medications   No medications on file   Past medical, surgical, family and social history reviewed   Review of Systems As above. All other systems reviewed are negative  Filed Vitals:   04/02/14 1054  BP: 134/78  Pulse: 74  Temp: 97.4 F (36.3 C)  TempSrc: Oral  Resp: 10  Height: $Remove'5\' 10"'[id number]$  (1.778 m)  Weight: 189 lb (85.73 kg)  SpO2: 99%   Body mass index is 27.12 kg/(m^2).  Physical Exam CONSTITUTIONAL: Looks well in NAD. Awake, alert and oriented x 3 HEENT: PERRLA. Oropharynx clear and without exudate NECK: Supple. Nontender. No palpable cervical or supraclavicular lymph nodes. No carotid bruit b/l. No thyromegaly or thyroid mass palpable.  CVS: Regular rate without murmur, gallop or rub. LUNGS: CTA b/l no wheezing, rales or rhonchi. ABDOMEN: Bowel sounds present x 4. Soft, nontender, nondistended. No palpable mass or bruit EXTREMITIES: No edema b/l. Distal pulses palpable. No calf tenderness PSYCH: Affect, behavior and mood normal MUSC: small and large joint swelling; b/l shoulder joint crepitus on ROM; hypertrophy of left rotator cuff muscles   Labs reviewed: Admission on 03/19/2014, Discharged on 03/19/2014  Component Date Value Ref Range Status  . WBC 03/19/2014 5.0  4.0 - 10.5 K/uL Final  . RBC 03/19/2014 4.12* 4.22 - 5.81 MIL/uL Final  . Hemoglobin 03/19/2014 13.5  13.0 - 17.0 g/dL Final  . HCT 03/19/2014 38.4* 39.0 - 52.0 % Final  . MCV 03/19/2014 93.2  78.0 - 100.0 fL Final  . MCH 03/19/2014 32.8  26.0 - 34.0 pg Final  .  MCHC 03/19/2014 35.2  30.0 - 36.0 g/dL Final  . RDW 03/19/2014 12.1  11.5 - 15.5 % Final  . Platelets 03/19/2014 212  150 - 400 K/uL Final  . Neutrophils Relative % 03/19/2014 58  43 - 77 % Final  . Neutro Abs 03/19/2014 3.0  1.7 - 7.7 K/uL  Final  . Lymphocytes Relative 03/19/2014 23  12 - 46 % Final  . Lymphs Abs 03/19/2014 1.2  0.7 - 4.0 K/uL Final  . Monocytes Relative 03/19/2014 10  3 - 12 % Final  . Monocytes Absolute 03/19/2014 0.5  0.1 - 1.0 K/uL Final  . Eosinophils Relative 03/19/2014 8* 0 - 5 % Final  . Eosinophils Absolute 03/19/2014 0.4  0.0 - 0.7 K/uL Final  . Basophils Relative 03/19/2014 1  0 - 1 % Final  . Basophils Absolute 03/19/2014 0.0  0.0 - 0.1 K/uL Final  . Sodium 03/19/2014 126* 135 - 145 mmol/L Final   Please note change in reference range.  . Potassium 03/19/2014 4.0  3.5 - 5.1 mmol/L Final   Please note change in reference range.  . Chloride 03/19/2014 93* 96 - 112 mEq/L Final  . CO2 03/19/2014 26  19 - 32 mmol/L Final  . Glucose, Bld 03/19/2014 100* 70 - 99 mg/dL Final  . BUN 03/19/2014 13  6 - 23 mg/dL Final  . Creatinine, Ser 03/19/2014 0.80  0.50 - 1.35 mg/dL Final  . Calcium 03/19/2014 8.4  8.4 - 10.5 mg/dL Final  . Total Protein 03/19/2014 6.8  6.0 - 8.3 g/dL Final  . Albumin 03/19/2014 4.2  3.5 - 5.2 g/dL Final  . AST 03/19/2014 19  0 - 37 U/L Final  . ALT 03/19/2014 18  0 - 53 U/L Final  . Alkaline Phosphatase 03/19/2014 56  39 - 117 U/L Final  . Total Bilirubin 03/19/2014 0.8  0.3 - 1.2 mg/dL Final  . GFR calc non Af Amer 03/19/2014 86* >90 mL/min Final  . GFR calc Af Amer 03/19/2014 >90  >90 mL/min Final   Comment: (NOTE) The eGFR has been calculated using the CKD EPI equation. This calculation has not been validated in all clinical situations. eGFR's persistently <90 mL/min signify possible Chronic Kidney Disease.   . Anion gap 03/19/2014 7  5 - 15 Final  . B Natriuretic Peptide 03/19/2014 93.4  0.0 - 100.0 pg/mL Final   Please note change  in reference range.  . Troponin I 03/19/2014 <0.03  <0.031 ng/mL Final   Comment:        NO INDICATION OF MYOCARDIAL INJURY. Please note change in reference range.    Hospital records reviewed  Chart reviewed  Assessment/Plan    ICD-9-CM ICD-10-CM   1. Situational depression 309.0 F43.21   2. Anxiety state 300.00 F41.1   3. Primary osteoarthritis involving multiple joints 715.09 M15.0   4. Essential hypertension - stable 401.9 I10   5. Fatigue due to depression 311 F32.9    780.79 R53.83   6. Pain in joint, ankle and foot, unspecified laterality 719.47 M25.579   7. Dyspnea 786.09 R06.00   8. BPH (benign prostatic hyperplasia) 600.00 N40.0   9. Hyperlipidemia 272.2 E78.2   10. Hyponatremia 276.1 W38.8 Basic Metabolic Panel   --recommend counseling for depression. He dclines pharmacologic testing  --continue vitamin supplements  --f/u with chiropracter as scheduled  --f/u with urology for mx of BPH  --will consider PFTs next OV  --f/u Na level to determine if normalized  --RTO in 3 mos for re-eval  Maximino Cozzolino S. Perlie Gold  [facility] [address] [place], [place] [address] [phone] Office (Wednesdays and Fridays 8 AM - 5 PM) [phone] Cell (Monday-Friday 8 AM - 5 PM)

## 2014-04-03 LAB — BASIC METABOLIC PANEL
BUN/Creatinine Ratio: 13 (ref 10–22)
BUN: 11 mg/dL (ref 8–27)
CO2: 24 mmol/L (ref 18–29)
Calcium: 8.9 mg/dL (ref 8.6–10.2)
Chloride: 99 mmol/L (ref 97–108)
Creatinine, Ser: 0.84 mg/dL (ref 0.76–1.27)
GFR calc Af Amer: 100 mL/min/{1.73_m2} (ref >59)
GFR, EST NON AFRICAN AMERICAN: 86 mL/min/{1.73_m2} (ref >59)
GLUCOSE: 85 mg/dL (ref 65–99)
Potassium: 4.8 mmol/L (ref 3.5–5.2)
SODIUM: 137 mmol/L (ref 134–144)

## 2014-04-05 ENCOUNTER — Ambulatory Visit: Payer: Self-pay | Admitting: Physician Assistant

## 2014-04-27 ENCOUNTER — Encounter: Payer: Self-pay | Admitting: Internal Medicine

## 2014-04-27 ENCOUNTER — Ambulatory Visit (INDEPENDENT_AMBULATORY_CARE_PROVIDER_SITE_OTHER): Payer: Medicare HMO | Admitting: Internal Medicine

## 2014-04-27 VITALS — BP 130/80 | HR 65 | Temp 97.4°F | Resp 18 | Ht 70.0 in | Wt 192.0 lb

## 2014-04-27 DIAGNOSIS — Q828 Other specified congenital malformations of skin: Secondary | ICD-10-CM

## 2014-04-27 DIAGNOSIS — M25579 Pain in unspecified ankle and joints of unspecified foot: Secondary | ICD-10-CM | POA: Diagnosis not present

## 2014-04-27 DIAGNOSIS — J069 Acute upper respiratory infection, unspecified: Secondary | ICD-10-CM

## 2014-04-27 DIAGNOSIS — Q809 Congenital ichthyosis, unspecified: Secondary | ICD-10-CM

## 2014-04-27 MED ORDER — GUAIFENESIN 100 MG/5ML PO SOLN: 5.0000 mL | ORAL | Status: DC | PRN

## 2014-04-27 MED ORDER — AMOXICILLIN-POT CLAVULANATE 875-125 MG PO TABS: 1.0000 | ORAL_TABLET | Freq: Two times a day (BID) | ORAL | Status: DC

## 2014-04-27 NOTE — Progress Notes (Signed)
Patient ID: Shawn Gaines, male   DOB: 04/18/1940, 74 y.o.   MRN: [id number]    Chief Complaint  Patient presents with  . Acute Visit    coughing x 1 week , itching comes and goes, SOB   Allergies  Allergen Reactions  . Doxycycline     Loss of balance & fatigue   HPI 74 y/o male patient is here for acute visit. He has been coughing for a month- got worse over a week for last 4-5 days . He has cough with clear phlegm for few days and now somewhat light yellow and has noticed a change in his voice. He feels congested in his sinuses and chest, unable to bring phlegm out, flonase helps some Denies runny nose and sore throat Denies difficulty swallowing Denies fever or chills or diaphoresis Appetite is good Denies chest pain Has not tried anything OTC Has been having itching in his skin, on and off for few weeks and this is bothering him No new medication or dietary intake, no swelling of lips or tongue reported. No new soap, detergent or lotion Also has pain in his toes and feet on and off- has been there for more than a year, no change in severity reported. Has hx of OA. No joint swelling, warmth or edema reported  ROS Denies chest pain Denies shortness of breath Denies leg swelling Denies palpitations Denies headache or dizziness  Past Medical History  Diagnosis Date  . Prostatic hypertrophy   . Cancer     skin cancer- R Buttocks-bx was postive - pt not sure what kind of cancer- for removeal in March  . Arthritis     Knee  . Rotator cuff disorder     BIL  . Colon polyps     TWO HYPERPLASTIC POLYPS  . Unspecified essential hypertension 05/26/2013  . Sinus trouble   . Breathing problem    Current Outpatient Prescriptions on File Prior to Visit  Medication Sig Dispense Refill  . aspirin EC 81 MG tablet Take 81 mg by mouth daily.    . B Complex-C (SUPER B COMPLEX PO) Take 1 tablet by mouth daily.     . Cholecalciferol (VITAMIN D3) 5000 UNITS TABS Take 1 tablet by mouth  daily.     . finasteride (PROSCAR) 5 MG tablet Take 5 mg by mouth daily.     . fluticasone (FLONASE) 50 MCG/ACT nasal spray Place 2 sprays into the nose daily.    . Lactobacillus Acid-Pectin (ACIDOPHILUS PLUS PECTIN) TABS Take by mouth daily.     . Magnesium 250 MG TABS Take 250 mg by mouth daily.     . Omega-3 Fatty Acids (OMEGA 3 PO) Take 1 capsule by mouth daily. 1000 mg, 300 mg Omega    . pantoprazole (PROTONIX) 40 MG tablet Take 1 tablet daily for acid reflux and cough 30 tablet 5  . Tamsulosin HCl (FLOMAX) 0.4 MG CAPS Take 0.4 mg by mouth daily.     No current facility-administered medications on file prior to visit.    Physical exam  BP 130/80 mmHg  Pulse 65  Temp(Src) 97.4 F (36.3 C) (Oral)  Resp 18  Ht _0  (1.778 m)  Wt 192 lb (87.091 kg)  BMI 27.55 kg/m2  SpO2 96%  General- elderly male in no acute distress Head- atraumatic, normocephalic Eyes- PERRLA, EOMI, no pallor, no icterus Neck- no lymphadenopathy Nose- maxillary sinus tenderness present, Nasal mucosa inflamed and swollen Mouth- moist mucus membrane, oropharyngeal erythema noted Cardiovascular-  normal s1,s2, no murmurs Respiratory- bilateral clear to auscultation, no wheeze, no rhonchi, no crackles Abdomen- bowel sounds present, soft, non tender Musculoskeletal- able to move all 4 extremities, no joint swelling or redness noted, normal temperature Neurological- no focal deficit Psychiatry- alert and oriented to person, place and time, normal mood and affect Skin- dry, some scattered areas of raised patches with rough feel, no scaling noted, no erythema or tenderness, no drainage  Labs  BMP Latest Ref Rng 04/02/2014 03/19/2014 12/29/2013  Glucose 65 - 99 mg/dL 85 100(H) 87  BUN 8 - 27 mg/dL _0 Creatinine 0.76 - 1.27 mg/dL 0.84 0.80 0.76  BUN/Creat Ratio 10 - 22 13 - -  Sodium 134 - 144 mmol/L 137 126(L) 134(L)  Potassium 3.5 - 5.2 mmol/L 4.8 4.0 4.4  Chloride 97 - 108 mmol/L 99 93(L) 103  CO2 18  - 29 mmol/L _1 Calcium 8.6 - 10.2 mg/dL 8.9 8.4 8.4   Hepatic Function Latest Ref Rng 03/19/2014 12/29/2013 07/16/2013  Total Protein 6.0 - 8.3 g/dL 6.8 6.2 6.4  Albumin 3.5 - 5.2 g/dL 4.2 3.9 4.1  AST 0 - 37 U/L _2 ALT 0 - 53 U/L _3 Alk Phosphatase 39 - 117 U/L 56 54 74  Total Bilirubin 0.3 - 1.2 mg/dL 0.8 0.5 0.6  Bilirubin, Direct 0.0 - 0.3 mg/dL - 0.1 0.1     Assessment/plan  1. Acute upper respiratory infection Start 5 days course of augmentin for now with OTC guaifenesin. Continue flonase for nasal stuffiness. Encouraged hydration and rest  2. Xeroderma Appears to be dry skin patches. Advised to avoid hot shower and to apply lotion generously post shower for moisture. Reassess if no improvement  3. Pain in joint, ankle and foot, unspecified laterality Chronic. Likely from his OA. Advised to try OTC tylenol on as needed basis for pain in his joints and see if this would help

## 2014-04-28 ENCOUNTER — Ambulatory Visit: Payer: Self-pay | Admitting: Internal Medicine

## 2014-04-29 ENCOUNTER — Telehealth: Payer: Self-pay | Admitting: Internal Medicine

## 2014-04-29 NOTE — Telephone Encounter (Signed)
Pt called, having diarrhea, started on yesterday, continued on today.  Was given Amoxicillin 875/125xg on yesterday. Need to know if he should continue using using. Call back # [phone] [facility]

## 2014-04-29 NOTE — Telephone Encounter (Signed)
Per Dr. Bubba Camp, Pt to continue taking the antibodic  Medication, per Dr. Bubba Camp  says diarrhea is a sign effect.  Add Florastor 250 mg..Take Florastor  twice daily for 2 weeks.  Spoke with Mr.Guia, says he feels bad and did not want to take anymore mores.Marland KitchenMarland KitchenIvin Booty (CMA) will talk with patient..Carolin Coy

## 2014-04-30 ENCOUNTER — Telehealth: Payer: Self-pay | Admitting: *Deleted

## 2014-04-30 NOTE — Telephone Encounter (Signed)
Spoke with patient concerning Florastor, he stated that the medication was not working fast enough, that he was still having very bad diarrhea. I informed him that he could also eat a yogurt cup to help speed up the process. I stated that would give that a try and give me a call tomorrow if he is not any better.

## 2014-04-30 NOTE — Telephone Encounter (Signed)
Spoke with Shawn Gaines to check to see how he was feeling today, he stated that he was feeling much better today. His stomach was back on track, no more diarrhea.

## 2014-05-05 ENCOUNTER — Ambulatory Visit: Payer: Medicare HMO | Admitting: Internal Medicine

## 2014-05-24 ENCOUNTER — Other Ambulatory Visit: Payer: Self-pay | Admitting: Otolaryngology

## 2014-05-24 DIAGNOSIS — R1312 Dysphagia, oropharyngeal phase: Secondary | ICD-10-CM

## 2014-05-27 ENCOUNTER — Other Ambulatory Visit: Payer: Medicare HMO

## 2014-05-28 ENCOUNTER — Ambulatory Visit
Admission: RE | Admit: 2014-05-28 | Discharge: 2014-05-28 | Disposition: A | Discharge status: Home / Self Care | Payer: Medicare HMO | Source: Ambulatory Visit | Attending: Otolaryngology | Admitting: Otolaryngology

## 2014-05-28 DIAGNOSIS — R1312 Dysphagia, oropharyngeal phase: Secondary | ICD-10-CM

## 2014-06-30 ENCOUNTER — Ambulatory Visit: Payer: Medicare HMO | Admitting: Internal Medicine

## 2014-08-07 ENCOUNTER — Emergency Department (HOSPITAL_COMMUNITY)
Admission: EM | Admit: 2014-08-07 | Discharge: 2014-08-07 | Disposition: A | Discharge status: Home / Self Care | Payer: Medicare HMO | Attending: Emergency Medicine | Admitting: Emergency Medicine

## 2014-08-07 ENCOUNTER — Encounter (HOSPITAL_COMMUNITY): Payer: Self-pay | Admitting: *Deleted

## 2014-08-07 ENCOUNTER — Emergency Department (HOSPITAL_COMMUNITY): Payer: Medicare HMO

## 2014-08-07 DIAGNOSIS — Z87438 Personal history of other diseases of male genital organs: Secondary | ICD-10-CM | POA: Insufficient documentation

## 2014-08-07 DIAGNOSIS — R55 Syncope and collapse: Secondary | ICD-10-CM | POA: Diagnosis not present

## 2014-08-07 DIAGNOSIS — Z7982 Long term (current) use of aspirin: Secondary | ICD-10-CM | POA: Insufficient documentation

## 2014-08-07 DIAGNOSIS — Z85828 Personal history of other malignant neoplasm of skin: Secondary | ICD-10-CM | POA: Diagnosis not present

## 2014-08-07 DIAGNOSIS — R110 Nausea: Secondary | ICD-10-CM | POA: Diagnosis not present

## 2014-08-07 DIAGNOSIS — Z8709 Personal history of other diseases of the respiratory system: Secondary | ICD-10-CM | POA: Insufficient documentation

## 2014-08-07 DIAGNOSIS — M1990 Unspecified osteoarthritis, unspecified site: Secondary | ICD-10-CM | POA: Diagnosis not present

## 2014-08-07 DIAGNOSIS — Z86010 Personal history of colonic polyps: Secondary | ICD-10-CM | POA: Diagnosis not present

## 2014-08-07 DIAGNOSIS — Z87891 Personal history of nicotine dependence: Secondary | ICD-10-CM | POA: Insufficient documentation

## 2014-08-07 DIAGNOSIS — I10 Essential (primary) hypertension: Secondary | ICD-10-CM | POA: Insufficient documentation

## 2014-08-07 DIAGNOSIS — R0602 Shortness of breath: Secondary | ICD-10-CM | POA: Insufficient documentation

## 2014-08-07 DIAGNOSIS — Z7951 Long term (current) use of inhaled steroids: Secondary | ICD-10-CM | POA: Diagnosis not present

## 2014-08-07 DIAGNOSIS — Z79899 Other long term (current) drug therapy: Secondary | ICD-10-CM | POA: Diagnosis not present

## 2014-08-07 LAB — CBC WITH DIFFERENTIAL/PLATELET
Basophils Absolute: 0 10*3/uL (ref 0.0–0.1)
Basophils Relative: 1 % (ref 0–1)
EOS PCT: 5 % (ref 0–5)
Eosinophils Absolute: 0.3 10*3/uL (ref 0.0–0.7)
HCT: 37.9 % — ABNORMAL LOW (ref 39.0–52.0)
Hemoglobin: 12.9 g/dL — ABNORMAL LOW (ref 13.0–17.0)
Lymphocytes Relative: 16 % (ref 12–46)
Lymphs Abs: 1 10*3/uL (ref 0.7–4.0)
MCH: 32 pg (ref 26.0–34.0)
MCHC: 34 g/dL (ref 30.0–36.0)
MCV: 94 fL (ref 78.0–100.0)
Monocytes Absolute: 0.6 10*3/uL (ref 0.1–1.0)
Monocytes Relative: 9 % (ref 3–12)
NEUTROS ABS: 4.5 10*3/uL (ref 1.7–7.7)
Neutrophils Relative %: 69 % (ref 43–77)
PLATELETS: 218 10*3/uL (ref 150–400)
RBC: 4.03 MIL/uL — ABNORMAL LOW (ref 4.22–5.81)
RDW: 12.6 % (ref 11.5–15.5)
WBC: 6.5 10*3/uL (ref 4.0–10.5)

## 2014-08-07 LAB — COMPREHENSIVE METABOLIC PANEL
ALT: 20 U/L (ref 17–63)
ANION GAP: 10 (ref 5–15)
AST: 20 U/L (ref 15–41)
Albumin: 3.9 g/dL (ref 3.5–5.0)
Alkaline Phosphatase: 61 U/L (ref 38–126)
BUN: 15 mg/dL (ref 6–20)
CO2: 22 mmol/L (ref 22–32)
CREATININE: 0.83 mg/dL (ref 0.61–1.24)
Calcium: 8.7 mg/dL — ABNORMAL LOW (ref 8.9–10.3)
Chloride: 98 mmol/L — ABNORMAL LOW (ref 101–111)
GFR calc Af Amer: >60 mL/min (ref >60)
GFR calc non Af Amer: >60 mL/min (ref >60)
Glucose, Bld: 123 mg/dL — ABNORMAL HIGH (ref 65–99)
Potassium: 4 mmol/L (ref 3.5–5.1)
Sodium: 130 mmol/L — ABNORMAL LOW (ref 135–145)
TOTAL PROTEIN: 6.6 g/dL (ref 6.5–8.1)
Total Bilirubin: 0.8 mg/dL (ref 0.3–1.2)

## 2014-08-07 LAB — URINE MICROSCOPIC-ADD ON

## 2014-08-07 LAB — URINALYSIS, ROUTINE W REFLEX MICROSCOPIC
BILIRUBIN URINE: NEGATIVE
Glucose, UA: NEGATIVE mg/dL
Hgb urine dipstick: NEGATIVE
Ketones, ur: NEGATIVE mg/dL
NITRITE: NEGATIVE
Protein, ur: NEGATIVE mg/dL
Specific Gravity, Urine: 1.01 (ref 1.005–1.030)
Urobilinogen, UA: 0.2 mg/dL (ref 0.0–1.0)
pH: 7.5 (ref 5.0–8.0)

## 2014-08-07 LAB — I-STAT TROPONIN, ED: Troponin i, poc: 0 ng/mL (ref 0.00–0.08)

## 2014-08-07 LAB — LIPASE, BLOOD: Lipase: 21 U/L — ABNORMAL LOW (ref 22–51)

## 2014-08-07 MED ORDER — ONDANSETRON 4 MG PO TBDP: 4.0000 mg | ORAL_TABLET | Freq: Three times a day (TID) | ORAL | Status: DC | PRN

## 2014-08-07 MED ORDER — SODIUM CHLORIDE 0.9 % IV BOLUS (SEPSIS)
1000.0000 mL | Freq: Once | INTRAVENOUS | Status: AC
  Administered 2014-08-07: 1000 mL via INTRAVENOUS

## 2014-08-07 MED ORDER — ONDANSETRON HCL 4 MG/2ML IJ SOLN
4.0000 mg | Freq: Once | INTRAMUSCULAR | Status: AC
  Administered 2014-08-07: 4 mg via INTRAVENOUS
  Filled 2014-08-07: qty 2

## 2014-08-07 NOTE — ED Provider Notes (Signed)
CSN: [id number]     Arrival date & time 08/07/14  1913 History   First MD Initiated Contact with Patient 08/07/14 1920     Chief Complaint  Patient presents with  . Loss of Consciousness   Shawn Gaines is a 74 y.o. male with a history of hypertension and arthritis who presents to the ED after a near syncope after eating fish while at work. 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 He reports after eating some fish the patient started to feel very nauseated, dizzy and almost passed out. He reports feeling like the fish tasted rancid and spoiled. He still complains of nausea. He denies any abdominal pain or vomiting. He also reports some slight shortness of breath earlier, but none currently. Patient was feeling that his stomach is upset and he feels needs to vomit. Patient denies loss of consciousness. Patient denies history of MI. Patient denies fevers, chills, numbness, tingling, weakness, headache, changes to his vision, chest pain, palpitations, cough, diarrhea, constipation, or rashes.   (Consider location/radiation/quality/duration/timing/severity/associated sxs/prior Treatment) HPI  Past Medical History  Diagnosis Date  . Prostatic hypertrophy   . Cancer     skin cancer- R Buttocks-bx was postive - pt not sure what kind of cancer- for removeal in March  . Arthritis     Knee  . Rotator cuff disorder     BIL  . Colon polyps     TWO HYPERPLASTIC POLYPS  . Unspecified essential hypertension 05/26/2013  . Sinus trouble   . Breathing problem    Past Surgical History  Procedure Laterality Date  . Tonsillectomy    . Knee arthroplasty  2009    right  . Joint replacement  2009    R Knee  . Total knee arthroplasty  05/04/2011    Procedure: TOTAL KNEE ARTHROPLASTY;  Surgeon: Kerin Salen, MD;  Location: [facility];  Service: Orthopedics;  Laterality: Left;  DEPUY  . Colonoscopy  2014    normal   Family History  Problem  Relation Age of Onset  . Anesthesia problems Neg Hx   . Colon cancer Neg Hx   . Arthritis Mother    History  Substance Use Topics  . Smoking status: Former Smoker -- 5 years    Types: Pipe  . Smokeless tobacco: Never Used     Comment: Cigs in teens--occassional "tried"---Smoked a pipe x 4-5 years.  . Alcohol Use: 0.6 - 1.2 oz/week    1-2 Standard drinks or equivalent per week    Review of Systems  Constitutional: Negative for fever and chills.  HENT: Negative for congestion and sore throat.   Eyes: Negative for visual disturbance.  Respiratory: Positive for shortness of breath. Negative for cough, chest tightness and wheezing.   Cardiovascular: Negative for chest pain, palpitations and leg swelling.  Gastrointestinal: Positive for nausea. Negative for vomiting, abdominal pain, diarrhea, constipation, blood in stool and abdominal distention.  Genitourinary: Negative for dysuria, frequency, hematuria and difficulty urinating.  Musculoskeletal: Negative for back pain and neck pain.  Skin: Negative for rash.  Neurological: Negative for dizziness, syncope (near syncope ), weakness, light-headedness, numbness and headaches.      Allergies  Doxycycline  Home Medications   Prior to Admission medications   Medication Sig Start Date End Date Taking? Authorizing Provider  aspirin EC 81 MG tablet Take 81 mg by mouth daily.   Yes Historical Provider, MD  B Complex-C (SUPER  B COMPLEX PO) Take 1 tablet by mouth daily.    Yes Historical Provider, MD  Cholecalciferol (VITAMIN D3) 5000 UNITS TABS Take 1 tablet by mouth daily.    Yes Historical Provider, MD  finasteride (PROSCAR) 5 MG tablet Take 5 mg by mouth daily.  02/04/13  Yes Historical Provider, MD  fluticasone (FLONASE) 50 MCG/ACT nasal spray Place 2 sprays into the nose daily. 05/23/12  Yes Laurey Morale, MD  Lactobacillus Acid-Pectin (ACIDOPHILUS PLUS PECTIN) TABS Take 1 tablet by mouth daily.    Yes Historical Provider, MD  Magnesium  250 MG TABS Take 250 mg by mouth daily.    Yes Historical Provider, MD  Omega-3 Fatty Acids (OMEGA 3 PO) Take 1 capsule by mouth daily.    Yes Historical Provider, MD  Tamsulosin HCl (FLOMAX) 0.4 MG CAPS Take 0.4 mg by mouth daily. 01/22/12  Yes Laurey Morale, MD  amoxicillin-clavulanate (AUGMENTIN) 875-125 MG per tablet Take 1 tablet by mouth 2 (two) times daily. Patient not taking: Reported on 08/07/2014 04/27/14   Blanchie Serve, MD  guaiFENesin (ROBITUSSIN) 100 MG/5ML SOLN Take 5 mLs (100 mg total) by mouth every 4 (four) hours as needed for cough or to loosen phlegm. Patient not taking: Reported on 08/07/2014 04/27/14   Blanchie Serve, MD  ondansetron (ZOFRAN ODT) 4 MG disintegrating tablet Take 1 tablet (4 mg total) by mouth every 8 (eight) hours as needed for nausea or vomiting. 08/07/14   Waynetta Pean, PA-C  pantoprazole (PROTONIX) 40 MG tablet Take 1 tablet daily for acid reflux and cough Patient not taking: Reported on 08/07/2014 01/21/14 01/22/15  Unk Pinto, MD   BP 126/79 mmHg  Pulse 76  Temp(Src) 97.8 F (36.6 C) (Oral)  Resp 18  SpO2 99% Physical Exam  Constitutional: He is oriented to person, place, and time. He appears well-developed and well-nourished. No distress.  Nontoxic appearing.  HENT:  Head: Normocephalic and atraumatic.  Mouth/Throat: Oropharynx is clear and moist. No oropharyngeal exudate.  Eyes: Conjunctivae are normal. Pupils are equal, round, and reactive to light. Right eye exhibits no discharge. Left eye exhibits no discharge.  Neck: Normal range of motion. Neck supple. No JVD present. No tracheal deviation present.  Cardiovascular: Normal rate, regular rhythm, normal heart sounds and intact distal pulses.  Exam reveals no gallop and no friction rub.   No murmur heard. Bilateral radial pulses are intact.   Pulmonary/Chest: Effort normal and breath sounds normal. No respiratory distress. He has no wheezes. He has no rales.  Lungs clear to auscultation  bilaterally.  Abdominal: Soft. Bowel sounds are normal. He exhibits no distension and no mass. There is no tenderness. There is no rebound and no guarding.  Abdomen is soft and nontender to palpation. Bowel sounds are present.  Musculoskeletal: He exhibits no edema or tenderness.  No lower extremity edema or tenderness. Patient is spontaneously moving all extremities in a coordinated fashion exhibiting good strength.   Lymphadenopathy:    He has no cervical adenopathy.  Neurological: He is alert and oriented to person, place, and time. No cranial nerve deficit. Coordination normal.  Cranial nerves are intact. Sensation is intact in his bilateral upper and lower extremities. He is alert and oriented x 3.   Skin: Skin is warm and dry. No rash noted. He is not diaphoretic. No erythema. No pallor.  Psychiatric: He has a normal mood and affect. His behavior is normal.  Nursing note and vitals reviewed.   ED Course  Procedures (including critical care  time) Labs Review Labs Reviewed  COMPREHENSIVE METABOLIC PANEL - Abnormal; Notable for the following:    Sodium 130 (*)    Chloride 98 (*)    Glucose, Bld 123 (*)    Calcium 8.7 (*)    All other components within normal limits  LIPASE, BLOOD - Abnormal; Notable for the following:    Lipase 21 (*)    All other components within normal limits  CBC WITH DIFFERENTIAL/PLATELET - Abnormal; Notable for the following:    RBC 4.03 (*)    Hemoglobin 12.9 (*)    HCT 37.9 (*)    All other components within normal limits  URINALYSIS, ROUTINE W REFLEX MICROSCOPIC (NOT AT [facility]) - Abnormal; Notable for the following:    Leukocytes, UA TRACE (*)    All other components within normal limits  URINE MICROSCOPIC-ADD ON - Abnormal; Notable for the following:    Bacteria, UA MANY (*)    All other components within normal limits  I-STAT TROPOININ, ED    Imaging Review Dg Abd Acute W/chest  08/07/2014   CLINICAL DATA:  Onset of weakness, dizziness and near  syncope following eating fish for dinner 1 hr ago, nausea, former smoker  EXAM: DG ABDOMEN ACUTE W/ 1V CHEST  COMPARISON:  Chest radiograph 03/19/2014  FINDINGS: Normal heart size, mediastinal contours, and pulmonary vascularity.  Emphysematous changes without infiltrate, pleural effusion or pneumothorax.  Bowel gas pattern normal.  No bowel dilatation, bowel wall thickening or free intraperitoneal air.  Slight prominent stool in rectum.  Bones demineralized.  No urine tract calcification.  IMPRESSION: COPD changes.  No acute abdominal findings.   Electronically Signed   By: Lavonia Dana M.D.   On: 08/07/2014 21:04     EKG Interpretation   Date/Time:  Saturday August 07 2014 19:25:13 EDT Ventricular Rate:  66 PR Interval:  142 QRS Duration: 89 QT Interval:  429 QTC Calculation: 449 R Axis:   41 Text Interpretation:  Sinus rhythm since last tracing no significant  change Reconfirmed by BELFI  MD, MELANIE ([id number]) on 08/07/2014 8:04:02 PM      Filed Vitals:   08/07/14 1957 08/07/14 1957 08/07/14 1958 08/07/14 2332  BP: 105/69 104/71 85/55 126/79  Pulse: 69 64 61 76  Temp:      TempSrc:      Resp: 17 18 19 18   SpO2: 94% 99% 97% 99%     MDM   Meds given in ED:  Medications  sodium chloride 0.9 % bolus 1,000 mL (0 mLs Intravenous Stopped 08/07/14 2353)  ondansetron (ZOFRAN) injection 4 mg (4 mg Intravenous Given 08/07/14 2006)    Discharge Medication List as of 08/07/2014 11:20 PM    START taking these medications   Details  ondansetron (ZOFRAN ODT) 4 MG disintegrating tablet Take 1 tablet (4 mg total) by mouth every 8 (eight) hours as needed for nausea or vomiting., Starting 08/07/2014, Until Discontinued, Print        Final diagnoses:  Nausea  Near syncope   This is a 74 y.o. male with a history of hypertension and arthritis who presents to the ED after a near syncope after eating fish while at work. 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 He reports after eating some fish the patient started to feel very nauseated, dizzy and almost passed out. He reports feeling like the fish tasted rancid and spoiled. He still complains of nausea. He denies  any abdominal pain or vomiting.   On exam patient is afebrile and nontoxic appearing. He is no abdominal tenderness to palpation. His lungs are clear to auscultation bilaterally. Is afebrile neurological deficits. Patient was found to be orthostatic on exam, but denies feeling lightheaded or dizzy when standing during orthostatic vitals.  Patient's urinalysis only shows trace leukocytes is otherwise unremarkable. He has negative troponin. His CMP showed a sodium of 1:30 and according 98 is otherwise unremarkable. His lipase is 21. CBC shows a hemoglobin of 12.9 with hematocrit 37.9. No leukocytosis. After the patient received fluid bolus and Zofran the patient reports his nausea has completely resolved and he feels back to normal. He reports feeling ready for discharge. He denies any abdominal pain. The patient is able to ambulate without difficulty or assistance. He denies any lightheadedness or dizziness upon standing. He feels comfortable with discharge.  I advised the patient to follow-up with their primary care provider this week. I advised the patient to return to the emergency department with new or worsening symptoms or new concerns. The patient verbalized understanding and agreement with plan.    This patient was discussed with and evaluated by Dr. Tamera Punt who agrees with assessment and plan.    Waynetta Pean, PA-C 08/08/14 [id number]  Malvin Johns, MD 08/08/14 [phone]

## 2014-08-07 NOTE — ED Notes (Signed)
Pt reports eating fish about an hour ago for dinner, states soon he started to feel weak and dizzy and had a near syncopal episode.  Pt denies any pain at this time.  Pt reports weakness and nausea

## 2014-08-07 NOTE — ED Notes (Signed)
Pt orthostatic- [facility] EDP aware

## 2014-08-07 NOTE — ED Notes (Signed)
Pt's friend is picking him up

## 2014-08-07 NOTE — ED Notes (Addendum)
Per EMS, pt works as a Land at [employer].  Had dizziness and became diaphoretic.  Reports near syncopal episode.  Pt is A&Ox 4-reports mid abd pain.  Denies any urinary sxs.  Received zofran 4 mg en route

## 2014-08-07 NOTE — Discharge Instructions (Signed)
Nausea and Vomiting Nausea is a sick feeling that often comes before throwing up (vomiting). Vomiting is a reflex where stomach contents come out of your mouth. Vomiting can cause severe loss of body fluids (dehydration). Children and elderly adults can become dehydrated quickly, especially if they also have diarrhea. Nausea and vomiting are symptoms of a condition or disease. It is important to find the cause of your symptoms. CAUSES   Direct irritation of the stomach lining. This irritation can result from increased acid production (gastroesophageal reflux disease), infection, food poisoning, taking certain medicines (such as nonsteroidal anti-inflammatory drugs), alcohol use, or tobacco use.  Signals from the brain.These signals could be caused by a headache, heat exposure, an inner ear disturbance, increased pressure in the brain from injury, infection, a tumor, or a concussion, pain, emotional stimulus, or metabolic problems.  An obstruction in the gastrointestinal tract (bowel obstruction).  Illnesses such as diabetes, hepatitis, gallbladder problems, appendicitis, kidney problems, cancer, sepsis, atypical symptoms of a heart attack, or eating disorders.  Medical treatments such as chemotherapy and radiation.  Receiving medicine that makes you sleep (general anesthetic) during surgery. DIAGNOSIS Your caregiver may ask for tests to be done if the problems do not improve after a few days. Tests may also be done if symptoms are severe or if the reason for the nausea and vomiting is not clear. Tests may include:  Urine tests.  Blood tests.  Stool tests.  Cultures (to look for evidence of infection).  X-rays or other imaging studies. Test results can help your caregiver make decisions about treatment or the need for additional tests. TREATMENT You need to stay well hydrated. Drink frequently but in small amounts.You may wish to drink water, sports drinks, clear broth, or eat frozen  ice pops or gelatin dessert to help stay hydrated.When you eat, eating slowly may help prevent nausea.There are also some antinausea medicines that may help prevent nausea. HOME CARE INSTRUCTIONS   Take all medicine as directed by your caregiver.  If you do not have an appetite, do not force yourself to eat. However, you must continue to drink fluids.  If you have an appetite, eat a normal diet unless your caregiver tells you differently.  Eat a variety of complex carbohydrates (rice, wheat, potatoes, bread), lean meats, yogurt, fruits, and vegetables.  Avoid high-fat foods because they are more difficult to digest.  Drink enough water and fluids to keep your urine clear or pale yellow.  If you are dehydrated, ask your caregiver for specific rehydration instructions. Signs of dehydration may include:  Severe thirst.  Dry lips and mouth.  Dizziness.  Dark urine.  Decreasing urine frequency and amount.  Confusion.  Rapid breathing or pulse. SEEK IMMEDIATE MEDICAL CARE IF:   You have blood or brown flecks (like coffee grounds) in your vomit.  You have black or bloody stools.  You have a severe headache or stiff neck.  You are confused.  You have severe abdominal pain.  You have chest pain or trouble breathing.  You do not urinate at least once every 8 hours.  You develop cold or clammy skin.  You continue to vomit for longer than 24 to 48 hours.  You have a fever. MAKE SURE YOU:   Understand these instructions.  Will watch your condition.  Will get help right away if you are not doing well or get worse. Document Released: 02/19/2005 Document Revised: 05/14/2011 Document Reviewed: 07/19/2010 [employer] Patient Information 2015 [employer]. This information is not intended   to replace advice given to you by your health care provider. Make sure you discuss any questions you have with your health care provider. Near-Syncope Near-syncope (commonly known as near  fainting) is sudden weakness, dizziness, or feeling like you might pass out. During an episode of near-syncope, you may also develop pale skin, have tunnel vision, or feel sick to your stomach (nauseous). Near-syncope may occur when getting up after sitting or while standing for a long time. It is caused by a sudden decrease in blood flow to the brain. This decrease can result from various causes or triggers, most of which are not serious. However, because near-syncope can sometimes be a sign of something serious, a medical evaluation is required. The specific cause is often not determined. HOME CARE INSTRUCTIONS  Monitor your condition for any changes. The following actions may help to alleviate any discomfort you are experiencing:  Have someone stay with you until you feel stable.  Lie down right away and prop your feet up if you start feeling like you might faint. Breathe deeply and steadily. Wait until all the symptoms have passed. Most of these episodes last only a few minutes. You may feel tired for several hours.   Drink enough fluids to keep your urine clear or pale yellow.   If you are taking blood pressure or heart medicine, get up slowly when seated or lying down. Take several minutes to sit and then stand. This can reduce dizziness.  Follow up with your health care provider as directed. SEEK IMMEDIATE MEDICAL CARE IF:   You have a severe headache.   You have unusual pain in the chest, abdomen, or back.   You are bleeding from the mouth or rectum, or you have black or tarry stool.   You have an irregular or very fast heartbeat.   You have repeated fainting or have seizure-like jerking during an episode.   You faint when sitting or lying down.   You have confusion.   You have difficulty walking.   You have severe weakness.   You have vision problems.  MAKE SURE YOU:   Understand these instructions.  Will watch your condition.  Will get help right away if  you are not doing well or get worse. Document Released: 02/19/2005 Document Revised: 02/24/2013 Document Reviewed: 07/25/2012 [facility] Patient Information 2015 [place], [place]. This information is not intended to replace advice given to you by your health care provider. Make sure you discuss any questions you have with your health care provider.

## 2014-09-01 ENCOUNTER — Other Ambulatory Visit: Payer: Self-pay | Admitting: Internal Medicine

## 2014-09-04 ENCOUNTER — Other Ambulatory Visit: Payer: Self-pay | Admitting: Internal Medicine

## 2014-09-22 ENCOUNTER — Encounter: Payer: Self-pay | Admitting: Internal Medicine

## 2014-10-01 ENCOUNTER — Encounter: Payer: Self-pay | Admitting: Podiatry

## 2014-10-01 ENCOUNTER — Ambulatory Visit (INDEPENDENT_AMBULATORY_CARE_PROVIDER_SITE_OTHER): Payer: Medicare HMO | Admitting: Podiatry

## 2014-10-01 VITALS — BP 132/84 | HR 58 | Resp 99 | Ht 72.0 in | Wt 184.0 lb

## 2014-10-01 DIAGNOSIS — B351 Tinea unguium: Secondary | ICD-10-CM | POA: Diagnosis not present

## 2014-10-01 DIAGNOSIS — M79676 Pain in unspecified toe(s): Secondary | ICD-10-CM

## 2014-10-01 NOTE — Progress Notes (Signed)
   Subjective:    Patient ID: Shawn Gaines, male    DOB: 1941/01/19, 74 y.o.   MRN: [id number]  HPI  74 year old male presents the office they with complaints of thick, painful, elongated toenails which is able to himself. He has is his nails and become discolored as well. His of ongoing for several months. He is tried some over-the-counter fungus lotion which seems to help. Denies any redness or drainage on the nail sites. No other complaints at this time.   Review of Systems  Respiratory: Positive for cough.   Genitourinary: Positive for urgency.  Musculoskeletal: Positive for arthralgias.  All other systems reviewed and are negative.      Objective:   Physical Exam AAO x3, NAD DP/PT pulses palpable bilaterally, CRT less than 3 seconds Protective sensation intact with Simms Weinstein monofilament, vibratory sensation intact, Achilles tendon reflex intact Nails are hypertrophic, dystrophic, discolored, brittle, elongated 10. There is tenderness to palpation Nails modified bilaterally. No surrounding erythema or drainage. No other areas of tenderness to bilateral lower extremities. MMT 5/5, ROM WNL.  No open lesions or pre-ulcerative lesions.  No overlying edema, erythema, increase in warmth to bilateral lower extremities.  No pain with calf compression, swelling, warmth, erythema bilaterally.      Assessment & Plan:  74 year old male with symptomatic onychomycosis -Treatment options discussed including all alternatives, risks, and complications -Nail sharply debrided 10 without complications/bleeding. Continue anti-fungal medicine. -Discussed importance of daily foot inspection. -Follow-up 3 months or sooner if any problems arise. In the meantime, encouraged to call the office with any questions, concerns, change in symptoms. Follow-up with PCP for other issues mentioned in the review of systems.  Celesta Gentile, DPM

## 2014-10-05 ENCOUNTER — Encounter: Payer: Self-pay | Admitting: Podiatry

## 2014-10-05 DIAGNOSIS — B351 Tinea unguium: Secondary | ICD-10-CM | POA: Insufficient documentation

## 2014-11-05 ENCOUNTER — Ambulatory Visit: Payer: Medicare HMO | Admitting: Rehabilitative and Restorative Service Providers"

## 2014-11-15 ENCOUNTER — Ambulatory Visit: Payer: Medicare HMO | Admitting: Physical Therapy

## 2014-11-17 ENCOUNTER — Ambulatory Visit: Payer: Medicare HMO | Attending: Internal Medicine | Admitting: Physical Therapy

## 2014-11-17 ENCOUNTER — Encounter: Payer: Self-pay | Admitting: Physical Therapy

## 2014-11-17 DIAGNOSIS — R269 Unspecified abnormalities of gait and mobility: Secondary | ICD-10-CM | POA: Insufficient documentation

## 2014-11-19 NOTE — Therapy (Signed)
[facility] [address] [place], [place], [address] Phone: [phone]   Fax:  [phone]  Physical Therapy Evaluation  Patient Details  Name: Shawn Gaines MRN: [id number] Date of Birth: 1941/02/24 Referring Provider:  Jodi Marble, MD  Encounter Date: 11/17/2014      PT End of Session - 11/19/14 1055    Visit Number 1   Number of Visits 1   Authorization Type [employer]   PT Start Time 5631   PT Stop Time 1100   PT Time Calculation (min) 45 min      Past Medical History  Diagnosis Date  . Prostatic hypertrophy   . Cancer     skin cancer- R Buttocks-bx was postive - pt not sure what kind of cancer- for removeal in March  . Arthritis     Knee  . Rotator cuff disorder     BIL  . Colon polyps     TWO HYPERPLASTIC POLYPS  . Unspecified essential hypertension 05/26/2013  . Sinus trouble   . Breathing problem     Past Surgical History  Procedure Laterality Date  . Tonsillectomy    . Knee arthroplasty  2009    right  . Joint replacement  2009    R Knee  . Total knee arthroplasty  05/04/2011    Procedure: TOTAL KNEE ARTHROPLASTY;  Surgeon: Kerin Salen, MD;  Location: [facility];  Service: Orthopedics;  Laterality: Left;  DEPUY  . Colonoscopy  2014    normal    There were no vitals filed for this visit.  Visit Diagnosis:  Abnormality of gait - Plan: PT plan of care cert/re-cert          [facility] PT Assessment - 11/19/14 0001    Assessment   Medical Diagnosis Ataxia   Onset Date/Surgical Date --  July 2016   Balance Screen   Has the patient fallen in the past 6 months Yes   How many times? 2   Has the patient had a decrease in activity level because of a fear of falling?  No   Is the patient reluctant to leave their home because of a fear of falling?  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[employer]	[place]   Comments independent   Ambulation/Gait   Ambulation/Gait Yes   Ambulation/Gait Assistance 7: Independent   Ambulation Distance (Feet) 200 Feet   Assistive device None   Gait Pattern Within Functional Limits   Ambulation Surface Level;Indoor   Gait velocity 3,86   Berg Balance Test   Sit to Stand Able to stand without using hands and stabilize independently   Standing Unsupported Able to stand safely 2 minutes   Sitting with Back Unsupported but Feet Supported on Floor or Stool Able to sit safely and securely 2 minutes   Stand to Sit Sits safely with minimal use of hands   Transfers Able to transfer safely, minor use of hands   Standing Unsupported with Eyes Closed Able to stand 10 seconds safely   Standing Ubsupported with Feet Together Able to place feet together independently and stand 1 minute safely  From Standing, Reach Forward with Outstretched Arm Can reach confidently >25 cm (10")   From Standing Position, Pick up Object from [place] to pick up shoe safely and easily   From Standing Position, Turn to Look Behind Over each Shoulder Looks behind from both sides and weight shifts well   Turn 360 Degrees Able to turn 360 degrees safely in 4 seconds or less   Standing Unsupported, Alternately Place Feet on Step/Stool Able to stand independently and safely and complete 8 steps in 20 seconds   Standing Unsupported, One Foot in [place] to place foot tandem independently and hold 30 seconds   Standing on One Leg Able to lift leg independently and hold > 10 seconds   Total Score 56   Berg comment: 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"   Timed Up and Go Test   Normal TUG (seconds) 8.53                           PT Education - 11/19/14 1054    Education  provided Yes   Education Details recommended that pt participate in leisure/recreational activities as he did previously as he reports some depression has led to inactivity but states he is doing better at this time  instructed in SLS and tandem stance   Person(s) Educated Patient   Methods Explanation   Comprehension Verbalized understanding                    Plan - 11/19/14 1056    Clinical Impression Statement No deficits noted at this time which warrant skilled PT intervention - pt states he is doing much better now than he was at time of referral   PT Next Visit Plan N/A- eval only   PT Home Exercise Plan single limb and tandem stance   Consulted and Agree with Plan of Care Patient      Pt does not feel that ongoing PT is needed - states he will increase his activity as he attributes some depression  to previous inactivity - states this has now improved and is less of an issue at this time   Problem List Patient Active Problem List   Diagnosis Date Noted  . Dermatophytosis of nail 10/05/2014  . Pain in joint, ankle and foot 04/02/2014  . Dyspnea 04/02/2014  . BPH (benign prostatic hyperplasia) 04/02/2014  . Hyponatremia 04/02/2014  . Medication management 12/29/2013  . Fatigue 07/16/2013  . IBS (irritable bowel syndrome) 07/16/2013  . Prediabetes 07/07/2013  . Essential hypertension 05/26/2013  . Hyperlipidemia 05/26/2013  . Encounter for long-term (current) use of other medications 05/26/2013  . Nocturnal dyspnea 03/19/2013  . HYDROCELE 08/18/2008  . B12 DEFICIENCY 06/27/2007  . Vitamin D deficiency 06/27/2007  . Osteoarthritis 03/25/2007  . Anxiety state 10/07/2006  . BENIGN PROSTATIC HYPERTROPHY, HX OF 10/07/2006    Alda Lea, PT 11/19/2014, 11:00 AM  [facility] [address] [place], [place], [address] Phone: [phone]   Fax:  [phone]

## 2014-12-01 ENCOUNTER — Other Ambulatory Visit: Payer: Self-pay | Admitting: Internal Medicine

## 2014-12-01 DIAGNOSIS — M545 Low back pain: Secondary | ICD-10-CM

## 2014-12-16 ENCOUNTER — Other Ambulatory Visit: Payer: Medicare HMO

## 2014-12-16 DIAGNOSIS — K219 Gastro-esophageal reflux disease without esophagitis: Secondary | ICD-10-CM | POA: Diagnosis not present

## 2014-12-16 DIAGNOSIS — Z6826 Body mass index (BMI) 26.0-26.9, adult: Secondary | ICD-10-CM | POA: Diagnosis not present

## 2014-12-16 DIAGNOSIS — R5383 Other fatigue: Secondary | ICD-10-CM | POA: Diagnosis not present

## 2014-12-16 DIAGNOSIS — R05 Cough: Secondary | ICD-10-CM | POA: Diagnosis not present

## 2014-12-16 DIAGNOSIS — R69 Illness, unspecified: Secondary | ICD-10-CM | POA: Diagnosis not present

## 2014-12-16 DIAGNOSIS — Z23 Encounter for immunization: Secondary | ICD-10-CM | POA: Diagnosis not present

## 2014-12-22 ENCOUNTER — Encounter: Payer: Self-pay | Admitting: Internal Medicine

## 2015-01-03 ENCOUNTER — Ambulatory Visit (INDEPENDENT_AMBULATORY_CARE_PROVIDER_SITE_OTHER): Payer: Medicare HMO | Admitting: Podiatry

## 2015-01-03 DIAGNOSIS — M2040 Other hammer toe(s) (acquired), unspecified foot: Secondary | ICD-10-CM

## 2015-01-03 DIAGNOSIS — B351 Tinea unguium: Secondary | ICD-10-CM | POA: Diagnosis not present

## 2015-01-03 DIAGNOSIS — M79676 Pain in unspecified toe(s): Secondary | ICD-10-CM

## 2015-01-03 DIAGNOSIS — M779 Enthesopathy, unspecified: Secondary | ICD-10-CM | POA: Diagnosis not present

## 2015-01-03 NOTE — Patient Instructions (Signed)
You can try to use aspercreme which you can get at the drug store  Peroneal Tendinitis With Rehab Tendonitis is inflammation of a tendon. Inflammation of the tendons on the back of the outer ankle (peroneal tendons) is known as peroneal tendonitis. The peroneal tendons are responsible for connecting the muscles that allow you to stand on your tiptoes to the bones of the ankle. For this reason, peroneal tendonitis often causes pain when trying to complete such motions. Peroneal tendonitis often involves a tear (strain) of the peroneal tendons. Strains are classified into three categories. Grade 1 strains cause pain, but the tendon is not lengthened. Grade 2 strains include a lengthened ligament, due to the ligament being stretched or partially ruptured. With grade 2 strains there is still function, although function may be decreased. Grade 3 strains involve a complete tear of the tendon or muscle, and function is usually impaired. SYMPTOMS   Pain, tenderness, swelling, warmth, or redness over the back of the outer side of the ankle, the outer part of the mid-foot, or the bottom of the arch.  Pain that gets worse with ankle motion (especially when pushing off or pushing down with the front of the foot), or when standing on the ball of the foot or pushing the foot outward.  Crackling sound (crepitation) when the tendon is moved or touched. CAUSES  Peroneal tendinitis occurs when injury to the peroneal tendons causes the body to respond with inflammation. Common causes of injury include:  An overuse injury, in which the groove behind the outer ankle (where the tendon is located) causes wear on the tendon.  A sudden stress placed on the tendon, such as from an increase in the intensity, frequency, or duration of training.  Direct hit (trauma) to the tendon.  Return to activity too soon after a previous ankle injury. RISK INCREASES WITH:  Sports that require sudden, repetitive pushing off of the  foot, such as jumping or quick starts.  Kicking and running sports, especially running down hills or long distances.  Poor strength and flexibility.  Previous injury to the foot, ankle, or leg. PREVENTION  Warm up and stretch properly before activity.  Allow for adequate recovery between workouts.  Maintain physical fitness:  Strength, flexibility, and endurance.  Cardiovascular fitness.  Complete rehabilitation after previous injury. PROGNOSIS  If treated properly, peroneal tendonitis usually heals within 6 weeks.  RELATED COMPLICATIONS  Longer healing time, if not properly treated or if not given enough time to heal.  Recurring symptoms if activity is resumed too soon, with overuse, or when using poor technique.  If untreated, tendinitis may result in tendon rupture, requiring surgery. TREATMENT  Treatment first involves the use of ice and medicine to reduce pain and inflammation. The use of strengthening and stretching exercises may help reduce pain with activity. These exercises may be performed at unsuccessful, surgery to remove the inflamed tendon lining (sheath) may be advised.  MEDICATION   If pain medicine is needed, nonsteroidal anti-inflammatory medicines (aspirin and ibuprofen), or other minor pain relievers (acetaminophen), are often advised.  Do not take pain medicine for 7 days before surgery.  Prescription pain relievers may be given, if your caregiver thinks they are needed. Use only as directed and only as much as you need. HEAT AND COLD  Cold treatment (icing) should be applied for 10 to 15 minutes every 2 to 3 hours for inflammation and pain, and immediately after activity that aggravates your symptoms. Use ice packs or an ice massage.  Heat treatment may be used before performing stretching and strengthening activities prescribed by your caregiver, physical therapist, or athletic trainer. Use a heat pack or a warm water soak. SEEK MEDICAL CARE  IF:  Symptoms get worse or do not improve in 2 to 4 weeks, despite treatment.  New, unexplained symptoms develop. (Drugs used in treatment may produce side effects.) EXERCISES RANGE OF MOTION (ROM) AND STRETCHING EXERCISES - Peroneal Tendinitis These exercises may help you when beginning to rehabilitate your injury. Your symptoms may resolve with or without further involvement from your physician, physical therapist or athletic trainer. While completing these exercises, remember:   Restoring tissue flexibility helps normal motion to return to the joints. This allows healthier, less painful movement and activity.  An effective stretch should be held for at least 30 seconds.  A stretch should never be painful. You should only feel a gentle lengthening or release in the stretched tissue. RANGE OF MOTION - Ankle Eversion  Sit with your right / left ankle crossed over your opposite knee.  Grip your foot with your opposite hand, placing your thumb on the top of your foot and your fingers across the bottom of your foot.  Gently push your foot downward with a slight rotation, so your littlest toes rise slightly toward the ceiling.  You should feel a gentle stretch on the inside of your ankle. Hold the stretch for __________ seconds. Repeat __________ times. Complete this exercise __________ times per day.  RANGE OF MOTION - Ankle Inversion  Sit with your right / left ankle crossed over your opposite knee.  Grip your foot with your opposite hand, placing your thumb on the bottom of your foot and your fingers across the top of your foot.  Gently pull your foot so the smallest toe comes toward you and your thumb pushes the inside of the ball of your foot away from you.  You should feel a gentle stretch on the outside of your ankle. Hold the stretch for __________ seconds. Repeat __________ times. Complete this exercise __________ times per day.  RANGE OF MOTION - Ankle Plantar Flexion  Sit  with your right / left leg crossed over your opposite knee.  Use your opposite hand to pull the top of your foot and toes toward you.  You should feel a gentle stretch on the top of your foot and ankle. Hold this position for __________ seconds. Repeat __________ times. Complete __________ times per day.  STRETCH - Gastroc, Standing  Place your hands on a wall.  Extend your right / left leg behind you, keeping the front knee somewhat bent.  Slightly point your toes inward on your back foot.  Keeping your right / left heel on the floor and your knee straight, shift your weight toward the wall, not allowing your back to arch.  You should feel a gentle stretch in the calf. Hold this position for __________ seconds. Repeat __________ times. Complete this stretch __________ times per day. STRETCH - Soleus, Standing  Place your hands on a wall.  Extend your right / left leg behind you, keeping the other knee somewhat bent.  Slightly point your toes inward on your back foot.  Keep your heel on the floor, bend your back knee, and slightly shift your weight over the back leg so that you feel a gentle stretch deep in your back calf.  Hold this position for __________ seconds. Repeat __________ times. Complete this stretch __________ times per day. STRETCH - Gastrocsoleus, Standing Note:  This exercise can place a lot of stress on your foot and ankle. Please complete this exercise only if specifically instructed by your caregiver.   Place the ball of your right / left foot on a step, keeping your other foot firmly on the same step.  Hold on to the wall or a rail for balance.  Slowly lift your other foot, allowing your body weight to press your heel down over the edge of the step.  You should feel a stretch in your right / left calf.  Hold this position for __________ seconds.  Repeat this exercise with a slight bend in your knee. Repeat __________ times. Complete this stretch  __________ times per day.  STRENGTHENING EXERCISES - Peroneal Tendinitis  These exercises may help you when beginning to rehabilitate your injury. They may resolve your symptoms with or without further involvement from your physician, physical therapist or athletic trainer. While completing these exercises, remember:   Muscles can gain both the endurance and the strength needed for everyday activities through controlled exercises.  Complete these exercises as instructed by your physician, physical therapist or athletic trainer. Increase the resistance and repetitions only as guided by your caregiver. STRENGTH - Dorsiflexors  Secure a rubber exercise band or tubing to a fixed object (table, pole) and loop the other end around your right / left foot.  Sit on the floor facing the fixed object. The band should be slightly tense when your foot is relaxed.  Slowly draw your foot back toward you, using your ankle and toes.  Hold this position for __________ seconds. Slowly release the tension in the band and return your foot to the starting position. Repeat __________ times. Complete this exercise __________ times per day.  STRENGTH - Towel Curls  Sit in a chair, on a non-carpeted surface.  Place your foot on a towel, keeping your heel on the floor.  Pull the towel toward your heel only by curling your toes. Keep your heel on the floor.  If instructed by your physician, physical therapist or athletic trainer, add weight to the end of the towel. Repeat __________ times. Complete this exercise __________ times per day. STRENGTH - Ankle Eversion   Secure one end of a rubber exercise band or tubing to a fixed object (table, pole). Loop the other end around your foot, just before your toes.  Place your fists between your knees. This will focus your strengthening at your ankle.  Drawing the band across your opposite foot, away from the pole, slowly, pull your little toe out and up. Make sure the  band is positioned to resist the entire motion.  Hold this position for __________ seconds.  Have your muscles resist the band, as it slowly pulls your foot back to the starting position. Repeat __________ times. Complete this exercise __________ times per day.    This information is not intended to replace advice given to you by your health care provider. Make sure you discuss any questions you have with your health care provider.   Document Released: 02/19/2005 Document Revised: 07/06/2014 Document Reviewed: 06/03/2008 Elsevier Interactive Patient Education [employer].

## 2015-01-04 NOTE — Progress Notes (Signed)
Patient ID: Shawn Gaines, male   DOB: [birth year], 74 y.o.   MRN: [id number]  Subjective: 74 y.o. returns the office today for painful, elongated, thickened toenails which he cannot trim himself. He has been using an OTC  Antifungal which seems to be helping. Denies any redness or drainage around the nails.  Also at his appointment today he mentioned that he was having pain in the outside portion of his right foot which is been ongoing for several years as well as hammertoes. He denies pain currently this time however intermittently he gets some pain to these areas. He denies any history of injury or trauma. No tingling or numbness. The pain does not wake him up at night.Denies any acute changes since last appointment and no new complaints today. Denies any systemic complaints such as fevers, chills, nausea, vomiting.   Objective: AAO 3, NAD DP/PT pulses palpable, CRT less than 3 seconds Protective sensation intact with Simms Weinstein monofilament Nails hypertrophic, dystrophic, elongated, brittle, discolored 10. There is tenderness overlying the nails 1-5 bilaterally. There is no surrounding erythema or drainage along the nail sites. No open lesions or pre-ulcerative lesions are identified.  protective retractors are present bilaterally. Upon palpation of the course of the peroneal tendon just proximal fifth metatarsal bases where he subjectively has pain however doesn't have pain at this time. There is no areas of tenderness bilateral lower extremity to this time. There is no overlying edema, erythema, increase in warmth. MMT 5/5, ROM WNL.  No pain with calf compression, swelling, warmth, erythema.  Assessment: Patient presents with symptomatic onychomycosis; tendonitis/hammertoes.   Plan: -Treatment options including alternatives, risks, complications were discussed -Nails sharply debrided 10 without complication/bleeding.  Continue with treatment for onychomycosis. -Discussed stretching  exercises to help peroneal tendinitis as well as icing. Discussed shoe gear modifications. Discuss orthotics. -Toe crests were dispensed today to help with hammertoes. Again discussed shoe gear modifications. -Discussed daily foot inspection. If there are any changes, to call the office immediately.  -Follow-up in 3 months or sooner if any problems are to arise. In the meantime, encouraged to call the office with any questions, concerns, changes symptoms.  Celesta Gentile, DPM

## 2015-01-11 ENCOUNTER — Telehealth: Payer: Self-pay | Admitting: *Deleted

## 2015-01-11 NOTE — Telephone Encounter (Signed)
Pt asked if Dr. Felicie Morn shoe inserts were a good product or did we have something better.  I told pt the Dr. Felicie Morn can work for temporary comfort but are made to fit most feet not individual pt's feet and after long term wear support collapses and are basically cushions, the sport orthotics we have are firmer and last longer, but are also made for many types of feet and problems.  I told pt we do make custom orthotics and they are made for each individual pt and they're problems, the foot is placed in a functioning foot and ankle position without weightbearing and scanned, that way the orthotic is made to support the structures of the foot and ankle in a standing walking position, not in the position where weight may change the support.  Pt asked how much the sport orthotic was and I told him $45.00, and his insurance would not cover.

## 2015-01-20 DIAGNOSIS — R351 Nocturia: Secondary | ICD-10-CM | POA: Diagnosis not present

## 2015-02-02 DIAGNOSIS — N3941 Urge incontinence: Secondary | ICD-10-CM | POA: Diagnosis not present

## 2015-02-02 DIAGNOSIS — R350 Frequency of micturition: Secondary | ICD-10-CM | POA: Diagnosis not present

## 2015-02-02 DIAGNOSIS — R351 Nocturia: Secondary | ICD-10-CM | POA: Diagnosis not present

## 2015-02-03 DIAGNOSIS — R32 Unspecified urinary incontinence: Secondary | ICD-10-CM | POA: Diagnosis not present

## 2015-02-06 ENCOUNTER — Other Ambulatory Visit: Payer: Self-pay | Admitting: Urology

## 2015-02-06 MED ORDER — SOLIFENACIN SUCCINATE 5 MG PO TABS: 5.0000 mg | ORAL_TABLET | Freq: Every day | ORAL | Status: DC

## 2015-02-12 ENCOUNTER — Other Ambulatory Visit (HOSPITAL_COMMUNITY)
Admission: RE | Admit: 2015-02-12 | Discharge: 2015-02-12 | Disposition: A | Discharge status: Home / Self Care | Payer: Medicare HMO | Source: Ambulatory Visit | Attending: Family Medicine | Admitting: Family Medicine

## 2015-02-12 ENCOUNTER — Emergency Department (HOSPITAL_COMMUNITY)
Admission: EM | Admit: 2015-02-12 | Discharge: 2015-02-12 | Disposition: A | Discharge status: Home / Self Care | Payer: Medicare HMO | Source: Home / Self Care | Attending: Family Medicine | Admitting: Family Medicine

## 2015-02-12 ENCOUNTER — Encounter (HOSPITAL_COMMUNITY): Payer: Self-pay | Admitting: Emergency Medicine

## 2015-02-12 DIAGNOSIS — R338 Other retention of urine: Secondary | ICD-10-CM | POA: Diagnosis not present

## 2015-02-12 DIAGNOSIS — N390 Urinary tract infection, site not specified: Secondary | ICD-10-CM

## 2015-02-12 DIAGNOSIS — N2889 Other specified disorders of kidney and ureter: Secondary | ICD-10-CM

## 2015-02-12 DIAGNOSIS — N401 Enlarged prostate with lower urinary tract symptoms: Secondary | ICD-10-CM

## 2015-02-12 LAB — POCT URINALYSIS DIP (DEVICE)
Bilirubin Urine: NEGATIVE
Glucose, UA: NEGATIVE mg/dL
Ketones, ur: NEGATIVE mg/dL
Nitrite: NEGATIVE
PH: 7 (ref 5.0–8.0)
PROTEIN: NEGATIVE mg/dL
SPECIFIC GRAVITY, URINE: 1.015 (ref 1.005–1.030)
UROBILINOGEN UA: 0.2 mg/dL (ref 0.0–1.0)

## 2015-02-12 MED ORDER — HYOSCYAMINE SULFATE 0.125 MG SL SUBL: 0.1250 mg | SUBLINGUAL_TABLET | SUBLINGUAL | Status: DC | PRN

## 2015-02-12 NOTE — ED Notes (Signed)
The patient presented to the [facility] with a complaint of a possible recurring UTI. The patient stated he was diagnosed with a bladder infection on 02/07/2015 by a urologist and has started having recurring pain and he has a urinary catheter in place.

## 2015-02-12 NOTE — Discharge Instructions (Signed)
Catheter-Associated Urinary Tract Infection FAQs  What is "catheter-associated urinary tract infection"?  A urinary tract infection (also called "UTI") is an infection in the urinary system, which includes the bladder (which stores the urine) and the kidneys (which filter the blood to make urine). Germs (for example, bacteria or yeasts) do not normally live in these areas; but if germs are introduced, an infection can occur.  If you have a urinary catheter, germs can travel along the catheter and cause an infection in your bladder or your kidney; in that case it is called a catheter-associated urinary tract infection (or "CA-UTI").   What is a urinary catheter?  A urinary catheter is a thin tube placed in the bladder to drain urine. Urine drains through the tube into a bag that collects the urine. A urinary catheter may be used:  · If you are not able to urinate on your own  · To measure the amount of urine that you make, for example, during intensive care  · During and after some types of surgery  · During some tests of the kidneys and bladder  People with urinary catheters have a much higher chance of getting a urinary tract infection than people who don't have a catheter.  How do I get a catheter-associated urinary tract infection (CA-UTI)?  If germs enter the urinary tract, they may cause an infection. Many of the germs that cause a catheter-associated urinary tract infection are common germs found in your intestines that do not usually cause an infection there. Germs can enter the urinary tract when the catheter is being put in or while the catheter remains in the bladder.   What are the symptoms of a urinary tract infection?  Some of the common symptoms of a urinary tract infection are:  · Burning or pain in the lower abdomen (that is, below the stomach)  · Fever  · Bloody urine may be a sign of infection, but is also caused by other problems  · Burning during urination or an increase in the frequency of  urination after the catheter is removed.  Sometimes people with catheter-associated urinary tract infections do not have these symptoms of infection.  Can catheter-associated urinary tract infections be treated?  Yes, most catheter-associated urinary tract infections can be treated with antibiotics and removal or change of the catheter. Your doctor will determine which antibiotic is best for you.   What are some of the things that hospitals are doing to prevent catheter-associated urinary tract infections?  To prevent urinary tract infections, doctors and nurses take the following actions.   Catheter insertion  · External catheters in men (these look like condoms and are placed over the penis rather than into the penis)  · Putting a temporary catheter in to drain the urine and removing it right away. This is called intermittent urethral catheterization.  Catheter care  What can I do to help prevent catheter-associated urinary tract infections if I have a catheter?  · Always clean your hands before and after doing catheter care.  · Always keep your urine bag below the level of your bladder.  · Do not tug or pull on the tubing.  · Do not twist or kink the catheter tubing.  · Ask your healthcare provider each day if you still need the catheter.  What do I need to do when I go home from the hospital?  · If you will be going home with a catheter, your doctor or nurse should explain everything   tract infection, such as burning or pain in the lower abdomen, fever, or an increase in the frequency of urination, contact your doctor or nurse immediately.  Before you go home, make sure you know who to contact if you have questions or problems after you get home. If you have questions, please ask your doctor or nurse. Developed and co-sponsored by [employer] for [place] [phone]); Infectious Diseases Society of [place] (IDSA); [place]; Association for Professionals in Infection Control and Epidemiology (APIC); Centers for Disease Control and Prevention (CDC); and [employer].   This information is not intended to replace advice given to you by your health care provider. Make sure you discuss any questions you have with your health care provider.   Document Released: 11/14/2011 Document Revised: 07/06/2014 Document Reviewed: 05/05/2014 Elsevier Interactive Patient Education 2016 [place].  Acute Urinary Retention, Male Acute urinary retention is when you are unable to pee (urinate). Acute urinary retention is common in older men. Prostates can get bigger, which blocks the flow of pee.  HOME CARE  Drink enough fluids to keep your pee clear or pale yellow.  If you are sent home with a tube that drains the bladder (catheter), there will be a drainage bag attached to it. There are two types of bags. One is big that you can wear at night without having to empty it. One is smaller and needs to be emptied more often.  Keep the drainage bag empty.  Keep the drainage bag lower than your catheter.  Only take medicine as told by your doctor. GET HELP IF:  You have a low-grade fever.  You have spasms or you are leaking pee when you have spasms. GET HELP RIGHT AWAY IF:   You have chills or a fever.  Your catheter stops draining pee.  Your catheter falls out.  You have increased bleeding that does not stop after you have rested and increased the amount of fluids you had been drinking. MAKE SURE YOU:   Understand these instructions.  Will watch your condition.  Will get help right away if you are not doing well or get worse.   This information is not intended to replace advice given to you by your health care provider. Make sure you discuss any questions you have with your  health care provider.   Document Released: 08/08/2007 Document Revised: 07/06/2014 Document Reviewed: 07/31/2012 Elsevier Interactive Patient Education 2016 [employer]  Dysuria Dysuria is pain or discomfort while urinating. The pain or discomfort may be felt in the tube that carries urine out of the bladder (urethra) or in the surrounding tissue of the genitals. The pain may also be felt in the groin area, lower abdomen, and lower back. You may have to urinate frequently or have the sudden feeling that you have to urinate (urgency). Dysuria can affect both men and women, but is more common in women. Dysuria can be caused by many different things, including:  Urinary tract infection in women.  Infection of the kidney or bladder.  Kidney stones or bladder stones.  Certain sexually transmitted infections (STIs), such as chlamydia.  Dehydration.  Inflammation of the vagina.  Use of certain medicines.  Use of certain soaps or scented products that cause irritation. HOME CARE INSTRUCTIONS Watch your dysuria for any changes. The following actions may help to reduce any discomfort you are feeling:  Drink enough fluid to keep your urine clear or pale yellow.  Empty your bladder often.  Avoid holding urine for long periods of time.  After a bowel movement or urination, women should cleanse from front to back, using each tissue only once.  Empty your bladder after sexual intercourse.  Take medicines only as directed by your health care provider.  If you were prescribed an antibiotic medicine, finish it all even if you start to feel better.  Avoid caffeine, tea, and alcohol. They can irritate the bladder and make dysuria worse. In men, alcohol may irritate the prostate.  Keep all follow-up visits as directed by your health care provider. This is important.  If you had any tests done to find the cause of dysuria, it is your responsibility to obtain your test results. Ask the lab or  department performing the test when and how you will get your results. Talk with your health care provider if you have any questions about your results. SEEK MEDICAL CARE IF:  You develop pain in your back or sides.  You have a fever.  You have nausea or vomiting.  You have blood in your urine.  You are not urinating as often as you usually do. SEEK IMMEDIATE MEDICAL CARE IF:  You pain is severe and not relieved with medicines.  You are unable to hold down any fluids.  You or someone else notices a change in your mental function.  You have a rapid heartbeat at rest.  You have shaking or chills.  You feel extremely weak.   This information is not intended to replace advice given to you by your health care provider. Make sure you discuss any questions you have with your health care provider.   Document Released: 11/18/2003 Document Revised: 03/12/2014 Document Reviewed: 10/15/2013 Elsevier Interactive Patient Education [employer].

## 2015-02-12 NOTE — ED Provider Notes (Signed)
CSN: [id number]     Arrival date & time 02/12/15  1355 History   First MD Initiated Contact with Patient 02/12/15 1602     Chief Complaint  Patient presents with  . Urinary Tract Infection   (Consider location/radiation/quality/duration/timing/severity/associated sxs/prior Treatment) HPI Comments: 74 year old male with history of prostatic hyperplasia and urinary frequency initially stated he requested to have himself cathed approximately 2 weeks ago for convenience. Just prior that he was diagnosed with UTI and treated with Keflex. He is still taking the Keflex with just a few capsules (he was feeling better until a couple days ago when he started feeling pain and irritation in the urethra. The patient is under the impression that he has another infection. He called his doctor and was told to come to the urgent care to obtain a urine culture. Last p.m. he states he felt cold, weak and had chills. No fever. denies flank pain.    Past Medical History  Diagnosis Date  . Prostatic hypertrophy   . Cancer ([place])     skin cancer- R Buttocks-bx was postive - pt not sure what kind of cancer- for removeal in March  . Arthritis     Knee  . Rotator cuff disorder     BIL  . Colon polyps     TWO HYPERPLASTIC POLYPS  . Unspecified essential hypertension 05/26/2013  . Sinus trouble   . Breathing problem    Past Surgical History  Procedure Laterality Date  . Tonsillectomy    . Knee arthroplasty  2009    right  . Joint replacement  2009    R Knee  . Total knee arthroplasty  05/04/2011    Procedure: TOTAL KNEE ARTHROPLASTY;  Surgeon: Kerin Salen, MD;  Location: [facility];  Service: Orthopedics;  Laterality: Left;  DEPUY  . Colonoscopy  2014    normal   Family History  Problem Relation Age of Onset  . Anesthesia problems Neg Hx   . Colon cancer Neg Hx   . Arthritis Mother    Social History  Substance Use Topics  . Smoking status: Former Smoker -- 5 years    Types: Pipe  . Smokeless tobacco:  Never Used     Comment: Cigs in teens--occassional "tried"---Smoked a pipe x 4-5 years.  . Alcohol Use: 0.6 - 1.2 oz/week    1-2 Standard drinks or equivalent per week    Review of Systems  Constitutional: Positive for chills and fatigue. Negative for fever and activity change.  HENT: Negative.   Respiratory: Negative.  Negative for cough and shortness of breath.   Cardiovascular: Negative for chest pain.  Gastrointestinal: Negative.   Genitourinary: Positive for dysuria. Negative for discharge and testicular pain.  Skin: Negative.     Allergies  Doxycycline  Home Medications   Prior to Admission medications   Medication Sig Start Date End Date Taking? Authorizing Provider  cephALEXin (KEFLEX) 500 MG capsule Take 500 mg by mouth 3 (three) times daily.   Yes Historical Provider, MD  aspirin EC 81 MG tablet Take 81 mg by mouth daily.    Historical Provider, MD  B Complex-C (SUPER B COMPLEX PO) Take 1 tablet by mouth daily.     Historical Provider, MD  Cholecalciferol (VITAMIN D3) 5000 UNITS TABS Take 1 tablet by mouth daily.     Historical Provider, MD  finasteride (PROSCAR) 5 MG tablet Take 5 mg by mouth daily.  02/04/13   Historical Provider, MD  fluticasone (FLONASE) 50 MCG/ACT nasal spray  Place 2 sprays into the nose daily. 05/23/12   Laurey Morale, MD  guaiFENesin (ROBITUSSIN) 100 MG/5ML SOLN Take 5 mLs (100 mg total) by mouth every 4 (four) hours as needed for cough or to loosen phlegm. Patient not taking: Reported on 11/17/2014 04/27/14   Blanchie Serve, MD  hyoscyamine (LEVSIN/SL) 0.125 MG SL tablet Place 1 tablet (0.125 mg total) under the tongue every 4 (four) hours as needed. 02/12/15   Janne Napoleon, NP  Lactobacillus Acid-Pectin (ACIDOPHILUS PLUS PECTIN) TABS Take 1 tablet by mouth daily.     Historical Provider, MD  Magnesium 250 MG TABS Take 250 mg by mouth daily.     Historical Provider, MD  Omega-3 Fatty Acids (OMEGA 3 PO) Take 1 capsule by mouth daily.     Historical  Provider, MD  ondansetron (ZOFRAN ODT) 4 MG disintegrating tablet Take 1 tablet (4 mg total) by mouth every 8 (eight) hours as needed for nausea or vomiting. 08/07/14   Waynetta Pean, PA-C  pantoprazole (PROTONIX) 40 MG tablet Take 1 tablet daily for acid reflux and cough 01/21/14 01/22/15  Unk Pinto, MD  PROAIR HFA 108 (90 BASE) MCG/ACT inhaler Inhale 1-2 puffs into the lungs as needed.  07/01/14   Historical Provider, MD  solifenacin (VESICARE) 5 MG tablet Take 1 tablet (5 mg total) by mouth daily. 02/06/15   Acie Fredrickson, MD  Tamsulosin HCl (FLOMAX) 0.4 MG CAPS Take 0.4 mg by mouth daily. 01/22/12   Laurey Morale, MD   Meds Ordered and Administered this Visit  Medications - No data to display  BP 153/90 mmHg  Pulse 68  Temp(Src) 97.8 F (36.6 C) (Oral)  SpO2 98% No data found.   Physical Exam  Constitutional: He appears well-developed and well-nourished. No distress.  Neck: Normal range of motion.  Cardiovascular: Normal rate.   Pulmonary/Chest: Effort normal. No respiratory distress.  Musculoskeletal: He exhibits no edema.  Neurological: He is alert. He exhibits normal muscle tone.  Skin: Skin is warm and dry.  Nursing note and vitals reviewed.   ED Course  Procedures (including critical care time)  Labs Review Labs Reviewed  POCT URINALYSIS DIP (DEVICE) - Abnormal; Notable for the following:    Hgb urine dipstick LARGE (*)    Leukocytes, UA SMALL (*)    All other components within normal limits  URINE CULTURE   Results for orders placed or performed during the hospital encounter of 02/12/15  POCT urinalysis dip (device)  Result Value Ref Range   Glucose, UA NEGATIVE NEGATIVE mg/dL   Bilirubin Urine NEGATIVE NEGATIVE   Ketones, ur NEGATIVE NEGATIVE mg/dL   Specific Gravity, Urine 1.015 1.005 - 1.030   Hgb urine dipstick LARGE (A) NEGATIVE   pH 7.0 5.0 - 8.0   Protein, ur NEGATIVE NEGATIVE mg/dL   Urobilinogen, UA 0.2 0.0 - 1.0 mg/dL   Nitrite NEGATIVE  NEGATIVE   Leukocytes, UA SMALL (A) NEGATIVE     Imaging Review No results found.   Visual Acuity Review  Right Eye Distance:   Left Eye Distance:   Bilateral Distance:    Right Eye Near:   Left Eye Near:    Bilateral Near:         MDM   1. Urinary retention due to benign prostatic hyperplasia   2. UTI (lower urinary tract infection)    From the outset has been difficult to obtain information from the patient. He asked questions and frequently that have already been answered. I gave him  my recommendation that we remove the catheter as this seems to be a source of irritation and possibly infection. He wanted to wait and think about it for a while and call his PCP. We have attempted to have his PCP P call us and the patient also call his PCP we have had no call back by 1720 hrs. I went back to speak with the patient again he asked me what I felt should be done and I repeated my recommendation of removing the catheter and have him follow-up with his urologist in 2 days. He again said I need to think about it.  Spoke with on-call urologist is 1740. He states that the patient has the catheter due to urinary retention and not for convenience due to frequent urination. He had an infection due to strep and S White was placed on Keflex. He recommends that the catheter not be removed and I agree having this new knowledge. He requests to be put on a bladder spasm medicine such as Levsin 0.125 mg. Follow-up with the nurse practitioner in the urology office on Monday call early for an appointment.  Janne Napoleon, NP 02/12/15 [phone]

## 2015-02-14 DIAGNOSIS — N138 Other obstructive and reflux uropathy: Secondary | ICD-10-CM | POA: Diagnosis not present

## 2015-02-14 DIAGNOSIS — R351 Nocturia: Secondary | ICD-10-CM | POA: Diagnosis not present

## 2015-02-14 DIAGNOSIS — N3941 Urge incontinence: Secondary | ICD-10-CM | POA: Diagnosis not present

## 2015-02-14 DIAGNOSIS — N401 Enlarged prostate with lower urinary tract symptoms: Secondary | ICD-10-CM | POA: Diagnosis not present

## 2015-02-14 LAB — URINE CULTURE

## 2015-02-16 DIAGNOSIS — K5900 Constipation, unspecified: Secondary | ICD-10-CM | POA: Diagnosis not present

## 2015-02-16 DIAGNOSIS — T839XXA Unspecified complication of genitourinary prosthetic device, implant and graft, initial encounter: Secondary | ICD-10-CM | POA: Diagnosis not present

## 2015-02-16 DIAGNOSIS — Y849 Medical procedure, unspecified as the cause of abnormal reaction of the patient, or of later complication, without mention of misadventure at the time of the procedure: Secondary | ICD-10-CM | POA: Diagnosis not present

## 2015-02-16 DIAGNOSIS — Z96659 Presence of unspecified artificial knee joint: Secondary | ICD-10-CM | POA: Diagnosis not present

## 2015-02-16 DIAGNOSIS — N390 Urinary tract infection, site not specified: Secondary | ICD-10-CM | POA: Diagnosis not present

## 2015-02-16 DIAGNOSIS — T83511A Infection and inflammatory reaction due to indwelling urethral catheter, initial encounter: Secondary | ICD-10-CM | POA: Diagnosis not present

## 2015-02-17 DIAGNOSIS — Z96659 Presence of unspecified artificial knee joint: Secondary | ICD-10-CM | POA: Diagnosis not present

## 2015-02-17 DIAGNOSIS — Y849 Medical procedure, unspecified as the cause of abnormal reaction of the patient, or of later complication, without mention of misadventure at the time of the procedure: Secondary | ICD-10-CM | POA: Diagnosis not present

## 2015-02-17 DIAGNOSIS — N390 Urinary tract infection, site not specified: Secondary | ICD-10-CM | POA: Diagnosis not present

## 2015-02-17 DIAGNOSIS — K5900 Constipation, unspecified: Secondary | ICD-10-CM | POA: Diagnosis not present

## 2015-02-17 DIAGNOSIS — T839XXA Unspecified complication of genitourinary prosthetic device, implant and graft, initial encounter: Secondary | ICD-10-CM | POA: Diagnosis not present

## 2015-02-22 DIAGNOSIS — N401 Enlarged prostate with lower urinary tract symptoms: Secondary | ICD-10-CM | POA: Diagnosis not present

## 2015-02-22 DIAGNOSIS — N138 Other obstructive and reflux uropathy: Secondary | ICD-10-CM | POA: Diagnosis not present

## 2015-02-22 DIAGNOSIS — R102 Pelvic and perineal pain: Secondary | ICD-10-CM | POA: Diagnosis not present

## 2015-02-22 DIAGNOSIS — N433 Hydrocele, unspecified: Secondary | ICD-10-CM | POA: Diagnosis not present

## 2015-03-10 DIAGNOSIS — N138 Other obstructive and reflux uropathy: Secondary | ICD-10-CM | POA: Diagnosis not present

## 2015-03-10 DIAGNOSIS — N401 Enlarged prostate with lower urinary tract symptoms: Secondary | ICD-10-CM | POA: Diagnosis not present

## 2015-03-10 DIAGNOSIS — N433 Hydrocele, unspecified: Secondary | ICD-10-CM | POA: Diagnosis not present

## 2015-03-10 DIAGNOSIS — N319 Neuromuscular dysfunction of bladder, unspecified: Secondary | ICD-10-CM | POA: Diagnosis not present

## 2015-03-10 DIAGNOSIS — R102 Pelvic and perineal pain: Secondary | ICD-10-CM | POA: Diagnosis not present

## 2015-03-11 DIAGNOSIS — Y846 Urinary catheterization as the cause of abnormal reaction of the patient, or of later complication, without mention of misadventure at the time of the procedure: Secondary | ICD-10-CM | POA: Diagnosis not present

## 2015-03-11 DIAGNOSIS — N400 Enlarged prostate without lower urinary tract symptoms: Secondary | ICD-10-CM | POA: Diagnosis not present

## 2015-03-11 DIAGNOSIS — T83031A Leakage of indwelling urethral catheter, initial encounter: Secondary | ICD-10-CM | POA: Diagnosis not present

## 2015-03-11 DIAGNOSIS — R531 Weakness: Secondary | ICD-10-CM | POA: Diagnosis not present

## 2015-03-11 DIAGNOSIS — T83038A Leakage of other indwelling urethral catheter, initial encounter: Secondary | ICD-10-CM | POA: Diagnosis not present

## 2015-03-31 DIAGNOSIS — R339 Retention of urine, unspecified: Secondary | ICD-10-CM | POA: Diagnosis not present

## 2015-04-04 ENCOUNTER — Ambulatory Visit: Payer: Medicare HMO | Admitting: Podiatry

## 2015-04-04 DIAGNOSIS — R350 Frequency of micturition: Secondary | ICD-10-CM | POA: Diagnosis not present

## 2015-04-22 DIAGNOSIS — M50322 Other cervical disc degeneration at C5-C6 level: Secondary | ICD-10-CM | POA: Diagnosis not present

## 2015-04-22 DIAGNOSIS — M47816 Spondylosis without myelopathy or radiculopathy, lumbar region: Secondary | ICD-10-CM | POA: Diagnosis not present

## 2015-04-22 DIAGNOSIS — M5136 Other intervertebral disc degeneration, lumbar region: Secondary | ICD-10-CM | POA: Diagnosis not present

## 2015-04-22 DIAGNOSIS — M40202 Unspecified kyphosis, cervical region: Secondary | ICD-10-CM | POA: Diagnosis not present

## 2015-04-22 DIAGNOSIS — M9901 Segmental and somatic dysfunction of cervical region: Secondary | ICD-10-CM | POA: Diagnosis not present

## 2015-04-22 DIAGNOSIS — M9902 Segmental and somatic dysfunction of thoracic region: Secondary | ICD-10-CM | POA: Diagnosis not present

## 2015-04-22 DIAGNOSIS — M47812 Spondylosis without myelopathy or radiculopathy, cervical region: Secondary | ICD-10-CM | POA: Diagnosis not present

## 2015-04-22 DIAGNOSIS — M9903 Segmental and somatic dysfunction of lumbar region: Secondary | ICD-10-CM | POA: Diagnosis not present

## 2015-04-27 DIAGNOSIS — M9903 Segmental and somatic dysfunction of lumbar region: Secondary | ICD-10-CM | POA: Diagnosis not present

## 2015-04-27 DIAGNOSIS — M9902 Segmental and somatic dysfunction of thoracic region: Secondary | ICD-10-CM | POA: Diagnosis not present

## 2015-04-27 DIAGNOSIS — M5136 Other intervertebral disc degeneration, lumbar region: Secondary | ICD-10-CM | POA: Diagnosis not present

## 2015-04-27 DIAGNOSIS — M9901 Segmental and somatic dysfunction of cervical region: Secondary | ICD-10-CM | POA: Diagnosis not present

## 2015-04-27 DIAGNOSIS — M50322 Other cervical disc degeneration at C5-C6 level: Secondary | ICD-10-CM | POA: Diagnosis not present

## 2015-04-28 DIAGNOSIS — M5136 Other intervertebral disc degeneration, lumbar region: Secondary | ICD-10-CM | POA: Diagnosis not present

## 2015-04-28 DIAGNOSIS — M9901 Segmental and somatic dysfunction of cervical region: Secondary | ICD-10-CM | POA: Diagnosis not present

## 2015-04-28 DIAGNOSIS — M9902 Segmental and somatic dysfunction of thoracic region: Secondary | ICD-10-CM | POA: Diagnosis not present

## 2015-04-28 DIAGNOSIS — M50322 Other cervical disc degeneration at C5-C6 level: Secondary | ICD-10-CM | POA: Diagnosis not present

## 2015-04-28 DIAGNOSIS — M9903 Segmental and somatic dysfunction of lumbar region: Secondary | ICD-10-CM | POA: Diagnosis not present

## 2015-05-02 DIAGNOSIS — M50322 Other cervical disc degeneration at C5-C6 level: Secondary | ICD-10-CM | POA: Diagnosis not present

## 2015-05-02 DIAGNOSIS — M9902 Segmental and somatic dysfunction of thoracic region: Secondary | ICD-10-CM | POA: Diagnosis not present

## 2015-05-02 DIAGNOSIS — R339 Retention of urine, unspecified: Secondary | ICD-10-CM | POA: Diagnosis not present

## 2015-05-02 DIAGNOSIS — M5136 Other intervertebral disc degeneration, lumbar region: Secondary | ICD-10-CM | POA: Diagnosis not present

## 2015-05-02 DIAGNOSIS — M9903 Segmental and somatic dysfunction of lumbar region: Secondary | ICD-10-CM | POA: Diagnosis not present

## 2015-05-02 DIAGNOSIS — M9901 Segmental and somatic dysfunction of cervical region: Secondary | ICD-10-CM | POA: Diagnosis not present

## 2015-05-03 DIAGNOSIS — M5136 Other intervertebral disc degeneration, lumbar region: Secondary | ICD-10-CM | POA: Diagnosis not present

## 2015-05-03 DIAGNOSIS — M9902 Segmental and somatic dysfunction of thoracic region: Secondary | ICD-10-CM | POA: Diagnosis not present

## 2015-05-03 DIAGNOSIS — M9901 Segmental and somatic dysfunction of cervical region: Secondary | ICD-10-CM | POA: Diagnosis not present

## 2015-05-03 DIAGNOSIS — M9903 Segmental and somatic dysfunction of lumbar region: Secondary | ICD-10-CM | POA: Diagnosis not present

## 2015-05-03 DIAGNOSIS — M50322 Other cervical disc degeneration at C5-C6 level: Secondary | ICD-10-CM | POA: Diagnosis not present

## 2015-05-05 DIAGNOSIS — M9902 Segmental and somatic dysfunction of thoracic region: Secondary | ICD-10-CM | POA: Diagnosis not present

## 2015-05-05 DIAGNOSIS — M9903 Segmental and somatic dysfunction of lumbar region: Secondary | ICD-10-CM | POA: Diagnosis not present

## 2015-05-05 DIAGNOSIS — R339 Retention of urine, unspecified: Secondary | ICD-10-CM | POA: Diagnosis not present

## 2015-05-05 DIAGNOSIS — M9901 Segmental and somatic dysfunction of cervical region: Secondary | ICD-10-CM | POA: Diagnosis not present

## 2015-05-05 DIAGNOSIS — M5136 Other intervertebral disc degeneration, lumbar region: Secondary | ICD-10-CM | POA: Diagnosis not present

## 2015-05-05 DIAGNOSIS — M50322 Other cervical disc degeneration at C5-C6 level: Secondary | ICD-10-CM | POA: Diagnosis not present

## 2015-05-06 DIAGNOSIS — I440 Atrioventricular block, first degree: Secondary | ICD-10-CM | POA: Diagnosis not present

## 2015-05-06 DIAGNOSIS — R55 Syncope and collapse: Secondary | ICD-10-CM | POA: Diagnosis not present

## 2015-05-11 DIAGNOSIS — N3941 Urge incontinence: Secondary | ICD-10-CM | POA: Diagnosis not present

## 2015-05-11 DIAGNOSIS — N310 Uninhibited neuropathic bladder, not elsewhere classified: Secondary | ICD-10-CM | POA: Diagnosis not present

## 2015-05-11 DIAGNOSIS — N401 Enlarged prostate with lower urinary tract symptoms: Secondary | ICD-10-CM | POA: Diagnosis not present

## 2015-05-11 DIAGNOSIS — R351 Nocturia: Secondary | ICD-10-CM | POA: Diagnosis not present

## 2015-05-11 DIAGNOSIS — N433 Hydrocele, unspecified: Secondary | ICD-10-CM | POA: Diagnosis not present

## 2015-05-12 DIAGNOSIS — R3915 Urgency of urination: Secondary | ICD-10-CM | POA: Diagnosis not present

## 2015-05-12 DIAGNOSIS — Z0000 Encounter for general adult medical examination without abnormal findings: Secondary | ICD-10-CM | POA: Diagnosis not present

## 2015-05-20 DIAGNOSIS — Z85828 Personal history of other malignant neoplasm of skin: Secondary | ICD-10-CM | POA: Diagnosis not present

## 2015-05-20 DIAGNOSIS — D2261 Melanocytic nevi of right upper limb, including shoulder: Secondary | ICD-10-CM | POA: Diagnosis not present

## 2015-05-20 DIAGNOSIS — D225 Melanocytic nevi of trunk: Secondary | ICD-10-CM | POA: Diagnosis not present

## 2015-05-20 DIAGNOSIS — L570 Actinic keratosis: Secondary | ICD-10-CM | POA: Diagnosis not present

## 2015-05-20 DIAGNOSIS — D2271 Melanocytic nevi of right lower limb, including hip: Secondary | ICD-10-CM | POA: Diagnosis not present

## 2015-05-20 DIAGNOSIS — D2272 Melanocytic nevi of left lower limb, including hip: Secondary | ICD-10-CM | POA: Diagnosis not present

## 2015-05-20 DIAGNOSIS — L918 Other hypertrophic disorders of the skin: Secondary | ICD-10-CM | POA: Diagnosis not present

## 2015-05-20 DIAGNOSIS — N433 Hydrocele, unspecified: Secondary | ICD-10-CM | POA: Diagnosis not present

## 2015-05-20 DIAGNOSIS — Z23 Encounter for immunization: Secondary | ICD-10-CM | POA: Diagnosis not present

## 2015-05-31 DIAGNOSIS — R32 Unspecified urinary incontinence: Secondary | ICD-10-CM | POA: Diagnosis not present

## 2015-05-31 DIAGNOSIS — R351 Nocturia: Secondary | ICD-10-CM | POA: Diagnosis not present

## 2015-05-31 DIAGNOSIS — R350 Frequency of micturition: Secondary | ICD-10-CM | POA: Diagnosis not present

## 2015-06-02 DIAGNOSIS — Z125 Encounter for screening for malignant neoplasm of prostate: Secondary | ICD-10-CM | POA: Diagnosis not present

## 2015-06-02 DIAGNOSIS — E559 Vitamin D deficiency, unspecified: Secondary | ICD-10-CM | POA: Diagnosis not present

## 2015-06-02 DIAGNOSIS — Z0000 Encounter for general adult medical examination without abnormal findings: Secondary | ICD-10-CM | POA: Diagnosis not present

## 2015-06-02 DIAGNOSIS — R69 Illness, unspecified: Secondary | ICD-10-CM | POA: Diagnosis not present

## 2015-06-13 DIAGNOSIS — Z6825 Body mass index (BMI) 25.0-25.9, adult: Secondary | ICD-10-CM | POA: Diagnosis not present

## 2015-06-13 DIAGNOSIS — R69 Illness, unspecified: Secondary | ICD-10-CM | POA: Diagnosis not present

## 2015-06-13 DIAGNOSIS — K219 Gastro-esophageal reflux disease without esophagitis: Secondary | ICD-10-CM | POA: Diagnosis not present

## 2015-06-13 DIAGNOSIS — E559 Vitamin D deficiency, unspecified: Secondary | ICD-10-CM | POA: Diagnosis not present

## 2015-06-13 DIAGNOSIS — Z0000 Encounter for general adult medical examination without abnormal findings: Secondary | ICD-10-CM | POA: Diagnosis not present

## 2015-06-13 DIAGNOSIS — J302 Other seasonal allergic rhinitis: Secondary | ICD-10-CM | POA: Diagnosis not present

## 2015-06-13 DIAGNOSIS — Z1389 Encounter for screening for other disorder: Secondary | ICD-10-CM | POA: Diagnosis not present

## 2015-06-13 DIAGNOSIS — R05 Cough: Secondary | ICD-10-CM | POA: Diagnosis not present

## 2015-06-13 DIAGNOSIS — N401 Enlarged prostate with lower urinary tract symptoms: Secondary | ICD-10-CM | POA: Diagnosis not present

## 2015-06-29 DIAGNOSIS — R350 Frequency of micturition: Secondary | ICD-10-CM | POA: Diagnosis not present

## 2015-06-29 DIAGNOSIS — R351 Nocturia: Secondary | ICD-10-CM | POA: Diagnosis not present

## 2015-06-29 DIAGNOSIS — Z0000 Encounter for general adult medical examination without abnormal findings: Secondary | ICD-10-CM | POA: Diagnosis not present

## 2015-06-29 DIAGNOSIS — N390 Urinary tract infection, site not specified: Secondary | ICD-10-CM | POA: Diagnosis not present

## 2015-06-29 DIAGNOSIS — N433 Hydrocele, unspecified: Secondary | ICD-10-CM | POA: Diagnosis not present

## 2015-06-29 DIAGNOSIS — N401 Enlarged prostate with lower urinary tract symptoms: Secondary | ICD-10-CM | POA: Diagnosis not present

## 2015-07-12 DIAGNOSIS — N138 Other obstructive and reflux uropathy: Secondary | ICD-10-CM | POA: Diagnosis not present

## 2015-07-12 DIAGNOSIS — N401 Enlarged prostate with lower urinary tract symptoms: Secondary | ICD-10-CM | POA: Diagnosis not present

## 2015-07-12 DIAGNOSIS — R351 Nocturia: Secondary | ICD-10-CM | POA: Diagnosis not present

## 2015-07-12 DIAGNOSIS — R350 Frequency of micturition: Secondary | ICD-10-CM | POA: Diagnosis not present

## 2015-07-12 DIAGNOSIS — N3941 Urge incontinence: Secondary | ICD-10-CM | POA: Diagnosis not present

## 2015-07-12 DIAGNOSIS — Z0000 Encounter for general adult medical examination without abnormal findings: Secondary | ICD-10-CM | POA: Diagnosis not present

## 2015-07-18 DIAGNOSIS — R05 Cough: Secondary | ICD-10-CM | POA: Diagnosis not present

## 2015-07-18 DIAGNOSIS — J3089 Other allergic rhinitis: Secondary | ICD-10-CM | POA: Diagnosis not present

## 2015-07-18 DIAGNOSIS — K219 Gastro-esophageal reflux disease without esophagitis: Secondary | ICD-10-CM | POA: Diagnosis not present

## 2015-07-18 DIAGNOSIS — H6123 Impacted cerumen, bilateral: Secondary | ICD-10-CM | POA: Diagnosis not present

## 2015-07-18 DIAGNOSIS — K141 Geographic tongue: Secondary | ICD-10-CM | POA: Diagnosis not present

## 2015-07-19 DIAGNOSIS — L602 Onychogryphosis: Secondary | ICD-10-CM | POA: Diagnosis not present

## 2015-07-19 DIAGNOSIS — M79674 Pain in right toe(s): Secondary | ICD-10-CM | POA: Diagnosis not present

## 2015-07-19 DIAGNOSIS — M79675 Pain in left toe(s): Secondary | ICD-10-CM | POA: Diagnosis not present

## 2015-07-20 DIAGNOSIS — M5416 Radiculopathy, lumbar region: Secondary | ICD-10-CM | POA: Diagnosis not present

## 2015-07-20 DIAGNOSIS — N401 Enlarged prostate with lower urinary tract symptoms: Secondary | ICD-10-CM | POA: Diagnosis not present

## 2015-07-20 DIAGNOSIS — K219 Gastro-esophageal reflux disease without esophagitis: Secondary | ICD-10-CM | POA: Diagnosis not present

## 2015-07-20 DIAGNOSIS — R5383 Other fatigue: Secondary | ICD-10-CM | POA: Diagnosis not present

## 2015-07-20 DIAGNOSIS — R69 Illness, unspecified: Secondary | ICD-10-CM | POA: Diagnosis not present

## 2015-07-20 DIAGNOSIS — Z6825 Body mass index (BMI) 25.0-25.9, adult: Secondary | ICD-10-CM | POA: Diagnosis not present

## 2015-07-20 DIAGNOSIS — J302 Other seasonal allergic rhinitis: Secondary | ICD-10-CM | POA: Diagnosis not present

## 2015-07-22 ENCOUNTER — Ambulatory Visit: Payer: Medicare HMO | Admitting: Podiatry

## 2015-08-13 ENCOUNTER — Observation Stay (HOSPITAL_COMMUNITY)
Admission: EM | Admit: 2015-08-13 | Discharge: 2015-08-14 | Disposition: A | Discharge status: Home / Self Care | Payer: Medicare HMO | Attending: Internal Medicine | Admitting: Internal Medicine

## 2015-08-13 ENCOUNTER — Encounter (HOSPITAL_COMMUNITY): Payer: Self-pay | Admitting: Family Medicine

## 2015-08-13 ENCOUNTER — Emergency Department (HOSPITAL_COMMUNITY): Payer: Medicare HMO

## 2015-08-13 DIAGNOSIS — J302 Other seasonal allergic rhinitis: Secondary | ICD-10-CM | POA: Diagnosis present

## 2015-08-13 DIAGNOSIS — R0602 Shortness of breath: Secondary | ICD-10-CM

## 2015-08-13 DIAGNOSIS — Z7982 Long term (current) use of aspirin: Secondary | ICD-10-CM | POA: Insufficient documentation

## 2015-08-13 DIAGNOSIS — Z85828 Personal history of other malignant neoplasm of skin: Secondary | ICD-10-CM | POA: Diagnosis not present

## 2015-08-13 DIAGNOSIS — N400 Enlarged prostate without lower urinary tract symptoms: Secondary | ICD-10-CM | POA: Diagnosis present

## 2015-08-13 DIAGNOSIS — R079 Chest pain, unspecified: Secondary | ICD-10-CM | POA: Diagnosis not present

## 2015-08-13 DIAGNOSIS — R0789 Other chest pain: Principal | ICD-10-CM | POA: Insufficient documentation

## 2015-08-13 DIAGNOSIS — K219 Gastro-esophageal reflux disease without esophagitis: Secondary | ICD-10-CM

## 2015-08-13 DIAGNOSIS — I10 Essential (primary) hypertension: Secondary | ICD-10-CM | POA: Diagnosis not present

## 2015-08-13 DIAGNOSIS — Z23 Encounter for immunization: Secondary | ICD-10-CM | POA: Insufficient documentation

## 2015-08-13 DIAGNOSIS — R42 Dizziness and giddiness: Secondary | ICD-10-CM | POA: Diagnosis not present

## 2015-08-13 DIAGNOSIS — R0600 Dyspnea, unspecified: Secondary | ICD-10-CM | POA: Diagnosis present

## 2015-08-13 DIAGNOSIS — Z96653 Presence of artificial knee joint, bilateral: Secondary | ICD-10-CM | POA: Insufficient documentation

## 2015-08-13 DIAGNOSIS — R110 Nausea: Secondary | ICD-10-CM | POA: Diagnosis not present

## 2015-08-13 DIAGNOSIS — R069 Unspecified abnormalities of breathing: Secondary | ICD-10-CM | POA: Diagnosis not present

## 2015-08-13 DIAGNOSIS — Z87891 Personal history of nicotine dependence: Secondary | ICD-10-CM | POA: Insufficient documentation

## 2015-08-13 LAB — CBC
HCT: 34.2 % — ABNORMAL LOW (ref 39.0–52.0)
Hemoglobin: 11.6 g/dL — ABNORMAL LOW (ref 13.0–17.0)
MCH: 31.3 pg (ref 26.0–34.0)
MCHC: 33.9 g/dL (ref 30.0–36.0)
MCV: 92.2 fL (ref 78.0–100.0)
PLATELETS: 204 10*3/uL (ref 150–400)
RBC: 3.71 MIL/uL — ABNORMAL LOW (ref 4.22–5.81)
RDW: 12.5 % (ref 11.5–15.5)
WBC: 5 10*3/uL (ref 4.0–10.5)

## 2015-08-13 LAB — BASIC METABOLIC PANEL
ANION GAP: 6 (ref 5–15)
BUN: 7 mg/dL (ref 6–20)
CHLORIDE: 97 mmol/L — AB (ref 101–111)
CO2: 23 mmol/L (ref 22–32)
CREATININE: 0.8 mg/dL (ref 0.61–1.24)
Calcium: 8.6 mg/dL — ABNORMAL LOW (ref 8.9–10.3)
GFR calc non Af Amer: >60 mL/min (ref >60)
Glucose, Bld: 107 mg/dL — ABNORMAL HIGH (ref 65–99)
Potassium: 4.1 mmol/L (ref 3.5–5.1)
Sodium: 126 mmol/L — ABNORMAL LOW (ref 135–145)

## 2015-08-13 LAB — I-STAT TROPONIN, ED: Troponin i, poc: 0 ng/mL (ref 0.00–0.08)

## 2015-08-13 LAB — COMPREHENSIVE METABOLIC PANEL
ALBUMIN: 3.9 g/dL (ref 3.5–5.0)
ALT: 61 U/L (ref 17–63)
AST: 42 U/L — AB (ref 15–41)
Alkaline Phosphatase: 65 U/L (ref 38–126)
Anion gap: 6 (ref 5–15)
BILIRUBIN TOTAL: 1.1 mg/dL (ref 0.3–1.2)
BUN: 7 mg/dL (ref 6–20)
CHLORIDE: 98 mmol/L — AB (ref 101–111)
CO2: 25 mmol/L (ref 22–32)
Calcium: 9 mg/dL (ref 8.9–10.3)
Creatinine, Ser: 0.76 mg/dL (ref 0.61–1.24)
GFR calc Af Amer: >60 mL/min (ref >60)
GFR calc non Af Amer: >60 mL/min (ref >60)
GLUCOSE: 105 mg/dL — AB (ref 65–99)
POTASSIUM: 4.4 mmol/L (ref 3.5–5.1)
Sodium: 129 mmol/L — ABNORMAL LOW (ref 135–145)
Total Protein: 6.7 g/dL (ref 6.5–8.1)

## 2015-08-13 LAB — MAGNESIUM: Magnesium: 1.9 mg/dL (ref 1.7–2.4)

## 2015-08-13 LAB — TROPONIN I

## 2015-08-13 MED ORDER — ASPIRIN EC 81 MG PO TBEC
81.0000 mg | DELAYED_RELEASE_TABLET | Freq: Every day | ORAL | Status: DC
  Administered 2015-08-14: 81 mg via ORAL
  Filled 2015-08-13: qty 1

## 2015-08-13 MED ORDER — IPRATROPIUM-ALBUTEROL 0.5-2.5 (3) MG/3ML IN SOLN: 3.0000 mL | RESPIRATORY_TRACT | Status: DC | PRN

## 2015-08-13 MED ORDER — MONTELUKAST SODIUM 10 MG PO TABS
10.0000 mg | ORAL_TABLET | Freq: Every day | ORAL | Status: DC
  Administered 2015-08-13: 10 mg via ORAL
  Filled 2015-08-13: qty 1

## 2015-08-13 MED ORDER — ONDANSETRON HCL 4 MG/2ML IJ SOLN: 4.0000 mg | Freq: Four times a day (QID) | INTRAMUSCULAR | Status: DC | PRN

## 2015-08-13 MED ORDER — ACETAMINOPHEN 325 MG PO TABS
650.0000 mg | ORAL_TABLET | Freq: Once | ORAL | Status: AC
  Administered 2015-08-13: 650 mg via ORAL
  Filled 2015-08-13: qty 2

## 2015-08-13 MED ORDER — MAGNESIUM 250 MG PO TABS: 250.0000 mg | ORAL_TABLET | Freq: Every day | ORAL | Status: DC

## 2015-08-13 MED ORDER — VITAMIN D3 125 MCG (5000 UT) PO TABS: 1.0000 | ORAL_TABLET | Freq: Every day | ORAL | Status: DC

## 2015-08-13 MED ORDER — BENZONATATE 100 MG PO CAPS
100.0000 mg | ORAL_CAPSULE | Freq: Three times a day (TID) | ORAL | Status: DC | PRN
  Administered 2015-08-14: 100 mg via ORAL
  Filled 2015-08-13: qty 1

## 2015-08-13 MED ORDER — FINASTERIDE 5 MG PO TABS
5.0000 mg | ORAL_TABLET | Freq: Every day | ORAL | Status: DC
  Administered 2015-08-14: 5 mg via ORAL
  Filled 2015-08-13: qty 1

## 2015-08-13 MED ORDER — ACETAMINOPHEN 325 MG PO TABS
650.0000 mg | ORAL_TABLET | ORAL | Status: DC | PRN
  Administered 2015-08-14: 650 mg via ORAL
  Filled 2015-08-13: qty 2

## 2015-08-13 MED ORDER — ACIDOPHILUS PLUS PECTIN PO TABS: 1.0000 | ORAL_TABLET | Freq: Every day | ORAL | Status: DC

## 2015-08-13 MED ORDER — ENOXAPARIN SODIUM 40 MG/0.4ML ~~LOC~~ SOLN
40.0000 mg | SUBCUTANEOUS | Status: DC
  Administered 2015-08-13: 40 mg via SUBCUTANEOUS
  Filled 2015-08-13: qty 0.4

## 2015-08-13 MED ORDER — GI COCKTAIL ~~LOC~~: 30.0000 mL | Freq: Four times a day (QID) | ORAL | Status: DC | PRN

## 2015-08-13 MED ORDER — SODIUM CHLORIDE 0.9 % IV SOLN
INTRAVENOUS | Status: DC
  Administered 2015-08-13: 22:00:00 via INTRAVENOUS
  Administered 2015-08-14: 1000 mL via INTRAVENOUS

## 2015-08-13 MED ORDER — MORPHINE SULFATE (PF) 2 MG/ML IV SOLN: 2.0000 mg | INTRAVENOUS | Status: DC | PRN

## 2015-08-13 MED ORDER — FLUTICASONE PROPIONATE 50 MCG/ACT NA SUSP
2.0000 | Freq: Every day | NASAL | Status: DC
  Filled 2015-08-13: qty 16

## 2015-08-13 MED ORDER — TAMSULOSIN HCL 0.4 MG PO CAPS
0.4000 mg | ORAL_CAPSULE | Freq: Every day | ORAL | Status: DC
  Administered 2015-08-14: 0.4 mg via ORAL
  Filled 2015-08-13: qty 1

## 2015-08-13 MED ORDER — PANTOPRAZOLE SODIUM 40 MG PO TBEC
40.0000 mg | DELAYED_RELEASE_TABLET | Freq: Every day | ORAL | Status: DC
  Administered 2015-08-14: 40 mg via ORAL
  Filled 2015-08-13: qty 1

## 2015-08-13 NOTE — H&P (Signed)
History and Physical    Shawn Gaines [id number] DOB: [birth year] DOA: 08/13/2015  Referring MD/NP/PA:  PCP: Gildardo Cranker, DO  Patient coming from: Home  Chief Complaint: Shortness breath   HPI: Shawn Gaines is a 75 y.o. male with medical history significant of HTN, BPH, seasonal allergies, and skin cancer s/p removal; who presents with complaints of shortness of breath. Symptoms started around 2 PM this afternoon while the patient states that he had had a light lunch and was doing some light house chores. Yesterday he states he may have done too much as he had mowed the yard with the pushmower and did a lot of heavy lifting. Patient did not try anything to alleviate symptoms. He just tried waiting it out how her symptoms progressively worsened. He reports associated symptoms of mild shortness of breath, nausea, some dizziness, and feeling a heaviness over the middle of his chest like elephant was sitting on it. He did not said that the pressure in his chest radiated anywhere.  Denies having any fever, diaphoresis, vomiting, or diarrhea. He has recently had dysuria and difficulty urinating for which she sees a urologist and has been on Azo(causing urine to be orange), Flomax, and finasteride.  ED Course:  En route the patient was noted to have heart rates in the 20s intermittently. He was given full dose aspirin and nitroglycerin and systolic blood pressures were noted to have dropped from 160s to 110s. On admission patient was seen to be afebrile, heart rates noted 31-70, respirations 14-23, blood pressures as low as 97/74, and O2 saturations maintained. Lab work WBC 5, hemoglobin 11.6, platelets 204, sodium 126, potassium 4.1, chloride 97, CO2 23, BUN 7, creatinine 0.8, calcium 8.6, glucose 107, troponin 0.0. CXR showed no acute abnormalities.   Review of Systems: As per HPI otherwise 10 point review of systems negative.   Past Medical History  Diagnosis Date  . Prostatic hypertrophy   .  Cancer ([place])     skin cancer- R Buttocks-bx was postive - pt not sure what kind of cancer- for removeal in March  . Arthritis     Knee  . Rotator cuff disorder     BIL  . Colon polyps     TWO HYPERPLASTIC POLYPS  . Unspecified essential hypertension 05/26/2013  . Sinus trouble   . Breathing problem     Past Surgical History  Procedure Laterality Date  . Tonsillectomy    . Knee arthroplasty  2009    right  . Joint replacement  2009    R Knee  . Total knee arthroplasty  05/04/2011    Procedure: TOTAL KNEE ARTHROPLASTY;  Surgeon: Kerin Salen, MD;  Location: [facility];  Service: Orthopedics;  Laterality: Left;  DEPUY  . Colonoscopy  2014    normal     reports that he has quit smoking. His smoking use included Pipe. He has never used smokeless tobacco. He reports that he drinks about 0.6 - 1.2 oz of alcohol per week. He reports that he does not use illicit drugs.  Allergies  Allergen Reactions  . Doxycycline     Loss of balance & fatigue    Family History  Problem Relation Age of Onset  . Anesthesia problems Neg Hx   . Colon cancer Neg Hx   . Arthritis Mother     Prior to Admission medications   Medication Sig Start Date End Date Taking? Authorizing Provider  aspirin EC 81 MG tablet Take 81 mg by mouth  daily.    Historical Provider, MD  B Complex-C (SUPER B COMPLEX PO) Take 1 tablet by mouth daily.     Historical Provider, MD  cephALEXin (KEFLEX) 500 MG capsule Take 500 mg by mouth 3 (three) times daily.    Historical Provider, MD  Cholecalciferol (VITAMIN D3) 5000 UNITS TABS Take 1 tablet by mouth daily.     Historical Provider, MD  finasteride (PROSCAR) 5 MG tablet Take 5 mg by mouth daily.  02/04/13   Historical Provider, MD  fluticasone (FLONASE) 50 MCG/ACT nasal spray Place 2 sprays into the nose daily. 05/23/12   Laurey Morale, MD  guaiFENesin (ROBITUSSIN) 100 MG/5ML SOLN Take 5 mLs (100 mg total) by mouth every 4 (four) hours as needed for cough or to loosen  phlegm. Patient not taking: Reported on 11/17/2014 04/27/14   Blanchie Serve, MD  hyoscyamine (LEVSIN/SL) 0.125 MG SL tablet Place 1 tablet (0.125 mg total) under the tongue every 4 (four) hours as needed. 02/12/15   Janne Napoleon, NP  Lactobacillus Acid-Pectin (ACIDOPHILUS PLUS PECTIN) TABS Take 1 tablet by mouth daily.     Historical Provider, MD  Magnesium 250 MG TABS Take 250 mg by mouth daily.     Historical Provider, MD  Omega-3 Fatty Acids (OMEGA 3 PO) Take 1 capsule by mouth daily.     Historical Provider, MD  ondansetron (ZOFRAN ODT) 4 MG disintegrating tablet Take 1 tablet (4 mg total) by mouth every 8 (eight) hours as needed for nausea or vomiting. 08/07/14   Waynetta Pean, PA-C  pantoprazole (PROTONIX) 40 MG tablet Take 1 tablet daily for acid reflux and cough 01/21/14 01/22/15  Unk Pinto, MD  PROAIR HFA 108 (90 BASE) MCG/ACT inhaler Inhale 1-2 puffs into the lungs as needed.  07/01/14   Historical Provider, MD  solifenacin (VESICARE) 5 MG tablet Take 1 tablet (5 mg total) by mouth daily. 02/06/15   Acie Fredrickson, MD  Tamsulosin HCl (FLOMAX) 0.4 MG CAPS Take 0.4 mg by mouth daily. 01/22/12   Laurey Morale, MD    Physical Exam: Filed Vitals:   08/13/15 1800 08/13/15 1815 08/13/15 1830 08/13/15 1915  BP: 97/74 105/72 106/66 122/83  Pulse: 57 62 57 59  Resp: 14 18 17 21   SpO2: 97% 100% 100% 99%      Constitutional: NAD, calm, comfortable Filed Vitals:   08/13/15 1800 08/13/15 1815 08/13/15 1830 08/13/15 1915  BP: 97/74 105/72 106/66 122/83  Pulse: 57 62 57 59  Resp: 14 18 17 21   SpO2: 97% 100% 100% 99%   Eyes: PERRL, lids and conjunctivae normal ENMT: Mucous membranes are moist. Posterior pharynx clear of any exudate or lesions.Normal dentition.  Neck: normal, supple, no masses, no thyromegaly Respiratory: clear to auscultation bilaterally, no wheezing, no crackles. Normal respiratory effort. No accessory muscle use.  Cardiovascular: Regular rate and rhythm, no murmurs /  rubs / gallops. No extremity edema. 2+ pedal pulses. No carotid bruits.  Abdomen: no tenderness, no masses palpated. No hepatosplenomegaly. Bowel sounds positive.  Musculoskeletal: no clubbing / cyanosis. No joint deformity upper and lower extremities. Good ROM, no contractures. Normal muscle tone.  Skin: no rashes, lesions, ulcers. No induration Neurologic: CN 2-12 grossly intact. Sensation intact, DTR normal. Strength 5/5 in all 4.  Psychiatric: Normal judgment and insight. Alert and oriented x 3. Normal mood.     Labs on Admission: I have personally reviewed following labs and imaging studies  CBC:  Recent Labs Lab 08/13/15 1748  WBC 5.0  HGB 11.6*  HCT 34.2*  MCV 92.2  PLT 0000000   Basic Metabolic Panel:  Recent Labs Lab 08/13/15 1748  NA 126*  K 4.1  CL 97*  CO2 23  GLUCOSE 107*  BUN 7  CREATININE 0.80  CALCIUM 8.6*   GFR: CrCl cannot be calculated (Unknown ideal weight.). Liver Function Tests: No results for input(s): AST, ALT, ALKPHOS, BILITOT, PROT, ALBUMIN in the last 168 hours. No results for input(s): LIPASE, AMYLASE in the last 168 hours. No results for input(s): AMMONIA in the last 168 hours. Coagulation Profile: No results for input(s): INR, PROTIME in the last 168 hours. Cardiac Enzymes: No results for input(s): CKTOTAL, CKMB, CKMBINDEX, TROPONINI in the last 168 hours. BNP (last 3 results) No results for input(s): PROBNP in the last 8760 hours. HbA1C: No results for input(s): HGBA1C in the last 72 hours. CBG: No results for input(s): GLUCAP in the last 168 hours. Lipid Profile: No results for input(s): CHOL, HDL, LDLCALC, TRIG, CHOLHDL, LDLDIRECT in the last 72 hours. Thyroid Function Tests: No results for input(s): TSH, T4TOTAL, FREET4, T3FREE, THYROIDAB in the last 72 hours. Anemia Panel: No results for input(s): VITAMINB12, FOLATE, FERRITIN, TIBC, IRON, RETICCTPCT in the last 72 hours. Urine analysis:    Component Value Date/Time    COLORURINE YELLOW 08/07/2014 2216   APPEARANCEUR CLEAR 08/07/2014 2216   LABSPEC 1.015 02/12/2015 1548   PHURINE 7.0 02/12/2015 1548   GLUCOSEU NEGATIVE 02/12/2015 1548   HGBUR LARGE* 02/12/2015 1548   HGBUR negative 03/24/2007 1449   BILIRUBINUR NEGATIVE 02/12/2015 1548   BILIRUBINUR n 08/17/2011 1334   KETONESUR NEGATIVE 02/12/2015 1548   PROTEINUR NEGATIVE 02/12/2015 1548   PROTEINUR n 08/17/2011 1334   UROBILINOGEN 0.2 02/12/2015 1548   UROBILINOGEN 1.0 08/17/2011 1334   NITRITE NEGATIVE 02/12/2015 1548   NITRITE n 08/17/2011 1334   LEUKOCYTESUR SMALL* 02/12/2015 1548   Sepsis Labs: No results found for this or any previous visit (from the past 240 hour(s)).   Radiological Exams on Admission: Dg Chest 2 View  08/13/2015  CLINICAL DATA:  75 year old male chest pain EXAM: CHEST  2 VIEW COMPARISON:  Chest radiograph dated 08/07/2014 FINDINGS: Two views of the chest demonstrate emphysematous changes of the lungs. There is no focal consolidation, pleural effusion, or pneumothorax. Minimal bibasilar atelectatic changes noted. The cardiac silhouette is within normal limits. No acute osseous pathology. IMPRESSION: No active cardiopulmonary disease. Electronically Signed   By: Anner Crete M.D.   On: 08/13/2015 19:18    EKG: Independently reviewed. Sinus rhythm with PVC  Assessment/Plan Shortness of breath & chest pressure: Acute. Initial troponin negative and EKG showing multiple PVCs. Heart score 5 - admit to a Telemetry bed - Trend cardiac troponins  - Check electrolytes and replace as needed. - Repeat EKG at 6 AM - Echocardiogram in a.m. - Duonebsprn - Continue aspirin - Follow-up telemetry and I am - Would consult cardiology in a.m. if warrented  Reported Bradycardia/ Frequent PVCs - as seen above  Anemia hemoglobin 11.6 on admission. Hemoglobin around 12 -13 - Continue to monitor   Hyponatremia: Acute on chronic. Sodium 126 on admission  -  NS IVF at 75 ml/hr  -   A check follow-up BMP in a.m.  Seasonal allergies - Continue singular, flonase  Benign Prostatic Hypertrophy - continue tamsulosin and finasteride - bladder scan and make sure patient does not have significant urinary retention  Dysuria - Follow-up UA/ culture - IV Rocephin  Gerd -  Continue Protonix  DVT  prophylaxis: lovenox Code Status:full Family Communication: none Disposition Plan: Possible discharge and in 1-2 days Consults called: None  Admission status:  Telemetry observation  Norval Morton MD [facility] Pager [phone]  If 7PM-7AM, please contact night-coverage [email or web address] Password [facility]  08/13/2015, 7:44 PM

## 2015-08-13 NOTE — ED Provider Notes (Signed)
CSN: [id number]     Arrival date & time 08/13/15  1736 History   First MD Initiated Contact with Patient 08/13/15 1740     Chief Complaint  Patient presents with  . Chest Pain  . Shortness of Breath     (Consider location/radiation/quality/duration/timing/severity/associated sxs/prior Treatment) HPI  75 year old male with a history of hypertension presents with concern for chest pain, shortness of breath, nausea and diaphoresis. Per EMS, patient with heart rate going to the 20s, however, on arrival to the emergency department.  Patient reports is walking around the house, getting ready to go to the mall, when he developed sudden onset of severe chest pressure. Describes as an inability to catch his breath, pressure, feeling like something is sitting on his chest, with associated shortness of breath, nausea, and sweating in arrival to the emergency department. Patient also reports lightheadedness and severe fatigue, like he couldn't do anything. Patient does not believe there is any exertional component, however reports the pain started while he was walking in the house. Denies any radiation of pain. Denies any cardiac history. Patient reports is not sure if the nitroglycerin helped his chest pain, however reports he is chest pain-free on arrival to the emergency department.  Past Medical History  Diagnosis Date  . Prostatic hypertrophy   . Cancer ([place])     skin cancer- R Buttocks-bx was postive - pt not sure what kind of cancer- for removeal in March  . Arthritis     Knee  . Rotator cuff disorder     BIL  . Colon polyps     TWO HYPERPLASTIC POLYPS  . Unspecified essential hypertension 05/26/2013  . Sinus trouble   . Breathing problem    Past Surgical History  Procedure Laterality Date  . Tonsillectomy    . Knee arthroplasty  2009    right  . Joint replacement  2009    R Knee  . Total knee arthroplasty  05/04/2011    Procedure: TOTAL KNEE ARTHROPLASTY;  Surgeon: Kerin Salen, MD;   Location: [facility];  Service: Orthopedics;  Laterality: Left;  DEPUY  . Colonoscopy  2014    normal   Family History  Problem Relation Age of Onset  . Anesthesia problems Neg Hx   . Colon cancer Neg Hx   . Arthritis Mother    Social History  Substance Use Topics  . Smoking status: Former Smoker -- 5 years    Types: Pipe  . Smokeless tobacco: Never Used     Comment: Cigs in teens--occassional "tried"---Smoked a pipe x 4-5 years.  . Alcohol Use: 0.6 - 1.2 oz/week    1-2 Standard drinks or equivalent per week    Review of Systems  Constitutional: Positive for fatigue. Negative for fever.  HENT: Negative for sore throat.   Eyes: Negative for visual disturbance.  Respiratory: Positive for shortness of breath.   Cardiovascular: Positive for chest pain.  Gastrointestinal: Positive for nausea. Negative for vomiting, abdominal pain and diarrhea.  Genitourinary: Negative for difficulty urinating.  Musculoskeletal: Negative for back pain and neck stiffness.  Skin: Negative for rash.  Neurological: Positive for light-headedness. Negative for syncope and headaches.      Allergies  Doxycycline; Doxycycline hyclate; and Lactose intolerance (gi)  Home Medications   Prior to Admission medications   Medication Sig Start Date End Date Taking? Authorizing Provider  aspirin EC 81 MG tablet Take 81 mg by mouth daily.   Yes Historical Provider, MD  B Complex-C ([place]  PO) Take 1 tablet by mouth daily.    Yes Historical Provider, MD  benzonatate (TESSALON) 100 MG capsule Take 100 mg by mouth 3 (three) times daily as needed for cough.   Yes Historical Provider, MD  Cholecalciferol (VITAMIN D3) 5000 UNITS TABS Take 1 tablet by mouth daily.    Yes Historical Provider, MD  finasteride (PROSCAR) 5 MG tablet Take 5 mg by mouth daily.  02/04/13  Yes Historical Provider, MD  Flax Oil-Fish Oil-Borage Oil (FISH OIL-FLAX OIL-BORAGE OIL PO) Take 1 tablet by mouth.   Yes Historical Provider, MD   fluticasone (FLONASE) 50 MCG/ACT nasal spray Place 2 sprays into the nose daily. 05/23/12  Yes Laurey Morale, MD  Lactobacillus Acid-Pectin (ACIDOPHILUS PLUS PECTIN) TABS Take 1 tablet by mouth daily.    Yes Historical Provider, MD  Magnesium 250 MG TABS Take 250 mg by mouth daily.    Yes Historical Provider, MD  montelukast (SINGULAIR) 10 MG tablet Take 10 mg by mouth at bedtime.   Yes Historical Provider, MD  OVER THE COUNTER MEDICATION Take 1 tablet by mouth daily.   Yes Historical Provider, MD  pantoprazole (PROTONIX) 40 MG tablet Take 1 tablet daily for acid reflux and cough 01/21/14 08/13/15 Yes Unk Pinto, MD  Tamsulosin HCl (FLOMAX) 0.4 MG CAPS Take 0.4 mg by mouth daily. 01/22/12  Yes Laurey Morale, MD   BP 130/73 mmHg  Pulse 61  Temp(Src) 97.4 F (36.3 C) (Oral)  Resp 16  Ht 6' (1.829 m)  Wt 183 lb 9.6 oz (83.28 kg)  BMI 24.90 kg/m2  SpO2 100% Physical Exam  Constitutional: He is oriented to person, place, and time. He appears well-developed and well-nourished. No distress.  HENT:  Head: Normocephalic and atraumatic.  Eyes: Conjunctivae and EOM are normal.  Neck: Normal range of motion.  Cardiovascular: Normal rate, regular rhythm, normal heart sounds and intact distal pulses.  Exam reveals no gallop and no friction rub.   No murmur heard. Pulmonary/Chest: Effort normal and breath sounds normal. No respiratory distress. He has no wheezes. He has no rales.  Abdominal: Soft. He exhibits no distension. There is no tenderness. There is no guarding.  Musculoskeletal: He exhibits no edema.  Neurological: He is alert and oriented to person, place, and time.  Skin: Skin is warm and dry. He is not diaphoretic.  Nursing note and vitals reviewed.   ED Course  Procedures (including critical care time) Labs Review Labs Reviewed  BASIC METABOLIC PANEL - Abnormal; Notable for the following:    Sodium 126 (*)    Chloride 97 (*)    Glucose, Bld 107 (*)    Calcium 8.6 (*)     All other components within normal limits  CBC - Abnormal; Notable for the following:    RBC 3.71 (*)    Hemoglobin 11.6 (*)    HCT 34.2 (*)    All other components within normal limits  URINALYSIS, ROUTINE W REFLEX MICROSCOPIC (NOT AT [facility]) - Abnormal; Notable for the following:    APPearance CLOUDY (*)    Specific Gravity, Urine 1.004 (*)    Leukocytes, UA TRACE (*)    All other components within normal limits  COMPREHENSIVE METABOLIC PANEL - Abnormal; Notable for the following:    Sodium 129 (*)    Chloride 98 (*)    Glucose, Bld 105 (*)    AST 42 (*)    All other components within normal limits  URINE MICROSCOPIC-ADD ON - Abnormal; Notable for the  following:    Squamous Epithelial / LPF 0-5 (*)    Bacteria, UA FEW (*)    All other components within normal limits  MAGNESIUM  TROPONIN I  TROPONIN I  TROPONIN I  CBC WITH DIFFERENTIAL/PLATELET  Randolm Idol, ED    Imaging Review Dg Chest 2 View  08/13/2015  CLINICAL DATA:  75 year old male chest pain EXAM: CHEST  2 VIEW COMPARISON:  Chest radiograph dated 08/07/2014 FINDINGS: Two views of the chest demonstrate emphysematous changes of the lungs. There is no focal consolidation, pleural effusion, or pneumothorax. Minimal bibasilar atelectatic changes noted. The cardiac silhouette is within normal limits. No acute osseous pathology. IMPRESSION: No active cardiopulmonary disease. Electronically Signed   By: Anner Crete M.D.   On: 08/13/2015 19:18   I have personally reviewed and evaluated these images and lab results as part of my medical decision-making.   EKG Interpretation   Date/Time:  Saturday August 13 2015 17:38:06 EDT Ventricular Rate:  65 PR Interval:  237 QRS Duration: 90 QT Interval:  423 QTC Calculation: 440 R Axis:   30 Text Interpretation:  Sinus rhythm Ventricular premature complex Prolonged  PR interval No significant change since last tracing Confirmed by  [facility] MD, [place] ([id number]) on 08/13/2015  7:25:31 PM      MDM   Final diagnoses:  Chest pain, unspecified chest pain type  Shortness of breath   75 year old male with a history of hypertension presents with concern for chest pain, shortness of breath, nausea and diaphoresis. Per EMS, patient with heart rate going to the 20s, however, on arrival to the emergency department, patient with stable heart rate in the 60s. Patient received aspirin and nitroglycerin with EMS, with a decrease in his blood pressure, however with resolution of his chest pain by arrival to the emergency department. He does not have any continuing shortness of breath or chest pain on my evaluation, has no asymmetric leg swelling, no hemoptysis, no tachypnea or tachycardia and have low suspicion for pulmonary embolus. He has normal pulses bilaterally, no abnormalities on x-Bear, and doubt dissection by history and physical exam. History is concerning for an anginal equivalent. EKG and initial troponin are normal, however given patient's history and age, he is high risk hear score, and will admit for further evaluation.  Gareth Morgan, MD 08/14/15 [phone]

## 2015-08-13 NOTE — ED Notes (Signed)
Pt here for SOB and chest pain sudden onset today around 2 pm. Given ASA and nitro. sts rate keeps fluctuating and dropping into the 20s at time. sts becomes pale and diaphoretic at  Times and feels syncopal. Given nitro and dropped pressure from 160 to 115.

## 2015-08-13 NOTE — ED Notes (Signed)
Attempted to call report

## 2015-08-14 ENCOUNTER — Encounter (HOSPITAL_COMMUNITY): Payer: Self-pay | Admitting: General Practice

## 2015-08-14 ENCOUNTER — Observation Stay (HOSPITAL_COMMUNITY): Payer: Medicare HMO

## 2015-08-14 ENCOUNTER — Observation Stay (HOSPITAL_BASED_OUTPATIENT_CLINIC_OR_DEPARTMENT_OTHER): Payer: Medicare HMO

## 2015-08-14 DIAGNOSIS — R072 Precordial pain: Secondary | ICD-10-CM | POA: Diagnosis not present

## 2015-08-14 DIAGNOSIS — J302 Other seasonal allergic rhinitis: Secondary | ICD-10-CM | POA: Diagnosis present

## 2015-08-14 DIAGNOSIS — I10 Essential (primary) hypertension: Secondary | ICD-10-CM | POA: Diagnosis not present

## 2015-08-14 DIAGNOSIS — R0600 Dyspnea, unspecified: Secondary | ICD-10-CM | POA: Diagnosis not present

## 2015-08-14 DIAGNOSIS — R079 Chest pain, unspecified: Secondary | ICD-10-CM | POA: Diagnosis not present

## 2015-08-14 DIAGNOSIS — N400 Enlarged prostate without lower urinary tract symptoms: Secondary | ICD-10-CM | POA: Diagnosis not present

## 2015-08-14 LAB — CBC WITH DIFFERENTIAL/PLATELET
BASOS ABS: 0 10*3/uL (ref 0.0–0.1)
Basophils Relative: 0 %
EOS PCT: 4 %
Eosinophils Absolute: 0.2 10*3/uL (ref 0.0–0.7)
HCT: 33.8 % — ABNORMAL LOW (ref 39.0–52.0)
HEMOGLOBIN: 11.4 g/dL — AB (ref 13.0–17.0)
LYMPHS ABS: 1.3 10*3/uL (ref 0.7–4.0)
LYMPHS PCT: 27 %
MCH: 31.1 pg (ref 26.0–34.0)
MCHC: 33.7 g/dL (ref 30.0–36.0)
MCV: 92.1 fL (ref 78.0–100.0)
Monocytes Absolute: 0.7 10*3/uL (ref 0.1–1.0)
Monocytes Relative: 14 %
NEUTROS PCT: 55 %
Neutro Abs: 2.5 10*3/uL (ref 1.7–7.7)
PLATELETS: 184 10*3/uL (ref 150–400)
RBC: 3.67 MIL/uL — AB (ref 4.22–5.81)
RDW: 12.5 % (ref 11.5–15.5)
WBC: 4.6 10*3/uL (ref 4.0–10.5)

## 2015-08-14 LAB — ECHOCARDIOGRAM COMPLETE
CHL CUP RV SYS PRESS: 29 mmHg
CHL CUP TV REG PEAK VELOCITY: 254 cm/s
E/e' ratio: 4.41
EWDT: 375 ms
FS: 33 % (ref 28–44)
HEIGHTINCHES: 72 in
IVS/LV PW RATIO, ED: 1.1
LA ID, A-P, ES: 45 mm
LA diam end sys: 45 mm
LA diam index: 2.2 cm/m2
LA vol A4C: 98.3 ml
LAVOL: 100 mL
LAVOLIN: 49 mL/m2
LV PW d: 8.81 mm — AB (ref 0.6–1.1)
LV TDI E'LATERAL: 11
LV TDI E'MEDIAL: 9.03
LV e' LATERAL: 11 cm/s
LVEEAVG: 4.41
LVEEMED: 4.41
LVOT area: 3.8 cm2
LVOT diameter: 22 mm
MV Dec: 375
MVPKAVEL: 40.3 m/s
MVPKEVEL: 48.5 m/s
RV LATERAL S' VELOCITY: 14.6 cm/s
RV TAPSE: 28.7 mm
TRMAXVEL: 254 cm/s
WEIGHTICAEL: 2881.6 [oz_av]

## 2015-08-14 LAB — BASIC METABOLIC PANEL
ANION GAP: 6 (ref 5–15)
BUN: 7 mg/dL (ref 6–20)
CALCIUM: 8.7 mg/dL — AB (ref 8.9–10.3)
CO2: 24 mmol/L (ref 22–32)
Chloride: 104 mmol/L (ref 101–111)
Creatinine, Ser: 0.77 mg/dL (ref 0.61–1.24)
GFR calc Af Amer: >60 mL/min (ref >60)
GLUCOSE: 94 mg/dL (ref 65–99)
Potassium: 4.1 mmol/L (ref 3.5–5.1)
SODIUM: 134 mmol/L — AB (ref 135–145)

## 2015-08-14 LAB — URINE MICROSCOPIC-ADD ON

## 2015-08-14 LAB — LIPID PANEL
CHOL/HDL RATIO: 4.7 ratio
Cholesterol: 154 mg/dL (ref 0–200)
HDL: 33 mg/dL — AB (ref >40)
LDL CALC: 101 mg/dL — AB (ref 0–99)
Triglycerides: 100 mg/dL (ref <150)
VLDL: 20 mg/dL (ref 0–40)

## 2015-08-14 LAB — TROPONIN I
Troponin I: <0.03 ng/mL (ref <0.031)
Troponin I: <0.03 ng/mL (ref <0.031)

## 2015-08-14 LAB — NM MYOCAR MULTI W/SPECT W/WALL MOTION / EF
CHL CUP RESTING HR STRESS: 63 {beats}/min
CSEPED: 5 min
CSEPEDS: 47 s
Estimated workload: 7.5 METS
Peak HR: 134 {beats}/min
Percent HR: 93 %

## 2015-08-14 LAB — URINALYSIS, ROUTINE W REFLEX MICROSCOPIC
BILIRUBIN URINE: NEGATIVE
Bilirubin Urine: NEGATIVE
GLUCOSE, UA: NEGATIVE mg/dL
Glucose, UA: NEGATIVE mg/dL
HGB URINE DIPSTICK: NEGATIVE
Hgb urine dipstick: NEGATIVE
KETONES UR: NEGATIVE mg/dL
Ketones, ur: NEGATIVE mg/dL
Leukocytes, UA: NEGATIVE
NITRITE: NEGATIVE
NITRITE: NEGATIVE
PROTEIN: NEGATIVE mg/dL
Protein, ur: NEGATIVE mg/dL
SPECIFIC GRAVITY, URINE: 1.008 (ref 1.005–1.030)
Specific Gravity, Urine: 1.004 — ABNORMAL LOW (ref 1.005–1.030)
pH: 7.5 (ref 5.0–8.0)
pH: 7.5 (ref 5.0–8.0)

## 2015-08-14 LAB — D-DIMER, QUANTITATIVE (NOT AT ARMC)

## 2015-08-14 MED ORDER — TECHNETIUM TC 99M TETROFOSMIN IV KIT
10.0000 | PACK | Freq: Once | INTRAVENOUS | Status: AC | PRN
  Administered 2015-08-14: 10 via INTRAVENOUS

## 2015-08-14 MED ORDER — REGADENOSON 0.4 MG/5ML IV SOLN
INTRAVENOUS | Status: AC
  Filled 2015-08-14: qty 5

## 2015-08-14 MED ORDER — PNEUMOCOCCAL VAC POLYVALENT 25 MCG/0.5ML IJ INJ
0.5000 mL | INJECTION | INTRAMUSCULAR | Status: AC
  Administered 2015-08-14: 0.5 mL via INTRAMUSCULAR
  Filled 2015-08-14: qty 0.5

## 2015-08-14 MED ORDER — DEXTROSE 5 % IV SOLN
1.0000 g | INTRAVENOUS | Status: DC
  Administered 2015-08-14: 1 g via INTRAVENOUS
  Filled 2015-08-14: qty 10

## 2015-08-14 MED ORDER — TECHNETIUM TC 99M TETROFOSMIN IV KIT
30.0000 | PACK | Freq: Once | INTRAVENOUS | Status: AC | PRN
  Administered 2015-08-14: 30 via INTRAVENOUS

## 2015-08-14 MED ORDER — PANTOPRAZOLE SODIUM 40 MG PO TBEC: 40.0000 mg | DELAYED_RELEASE_TABLET | Freq: Every day | ORAL | Status: AC

## 2015-08-14 NOTE — Progress Notes (Signed)
  Echocardiogram 2D Echocardiogram has been performed.  Shawn Gaines 08/14/2015, 10:29 AM

## 2015-08-14 NOTE — Consult Note (Signed)
CARDIOLOGY CONSULT NOTE       Patient ID: Shawn Gaines MRN: [id number] DOB/AGE: 10/01/1940 75 y.o.  Admit date: 08/13/2015 Referring Physician:  Tana Coast Primary Physician: Gildardo Cranker, DO Primary Cardiologist: Verl Blalock Reason for Consultation: Chest pain Dyspnea  Principal Problem:   Chest pain Active Problems:   Essential hypertension   Dyspnea   BPH (benign prostatic hyperplasia)   Seasonal allergies   HPI:  75 y.o. with no previous history of CAD. CRF;s HTN.  Symptoms started around 2 PM this yesterday while the patient states that he had had a light lunch and was doing some light house chores. Yesterday he states he may have done too much as he had mowed the yard with the pushmower and did a lot of heavy lifting. Normally pays someone to mow his yard  Patient did not try anything to alleviate symptoms. He just tried waiting it out how her symptoms progressively worsened. He reports associated symptoms of mild shortness of breath, nausea, some dizziness, and feeling a heaviness over the middle of his chest like elephant was sitting on it. He did not said that the pressure in his chest radiated anywhere. Denies having any fever, diaphoresis, vomiting, or diarrhea. He has recently had dysuria and difficulty urinating for which she sees a urologist and has been on Azo(causing urine to be orange), Flomax, and finasteride.  This am feels fine. No sick contacts no fever sputum cough Has seasonal allergies but not wheezing.  Former Systems developer Quit smoking years ago. No LE edema or history  Of recent trauma, long trips or DVT  ROS All other systems reviewed and negative except as noted above  Past Medical History  Diagnosis Date  . Prostatic hypertrophy   . Cancer ([place])     skin cancer- R Buttocks-bx was postive - pt not sure what kind of cancer- for removeal in March  . Arthritis     Knee  . Rotator cuff disorder     BIL  . Colon polyps     TWO HYPERPLASTIC POLYPS  .  Unspecified essential hypertension 05/26/2013  . Sinus trouble   . Breathing problem     Family History  Problem Relation Age of Onset  . Anesthesia problems Neg Hx   . Colon cancer Neg Hx   . Arthritis Mother     Social History   Social History  . Marital Status: Divorced    Spouse Name: N/A  . Number of Children: 1  . Years of Education: N/A   Occupational History  . Retired    Social History Main Topics  . Smoking status: Former Smoker -- 5 years    Types: Pipe  . Smokeless tobacco: Never Used     Comment: Cigs in teens--occassional "tried"---Smoked a pipe x 4-5 years.  . Alcohol Use: 0.6 - 1.2 oz/week    1-2 Standard drinks or equivalent per week  . Drug Use: No  . Sexual Activity: Not on file   Other Topics Concern  . 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 Tonsillectomy    . Knee arthroplasty  2009    right  . Joint replacement  2009    R Knee  .  Total knee arthroplasty  05/04/2011    Procedure: TOTAL KNEE ARTHROPLASTY;  Surgeon: Kerin Salen, MD;  Location: [place];  Service: Orthopedics;  Laterality: Left;  DEPUY  . Colonoscopy  2014    normal     . aspirin EC  81 mg Oral Daily  . cefTRIAXone (ROCEPHIN)  IV  1 g Intravenous Q24H  . enoxaparin (LOVENOX) injection  40 mg Subcutaneous Q24H  . finasteride  5 mg Oral Daily  . fluticasone  2 spray Each Nare Daily  . montelukast  10 mg Oral QHS  . pantoprazole  40 mg Oral Daily  . tamsulosin  0.4 mg Oral Daily   . sodium chloride 75 mL/hr at 08/13/15 2205    Physical Exam: Blood pressure 110/69, pulse 60, temperature 97.7 F (36.5 C), temperature source Oral, resp. rate 16, height 6' (1.829 m), weight 81.693 kg (180 lb 1.6 oz), SpO2 99 %.    Affect  appropriate Healthy:  appears stated age 75: Poor dentition Neck supple with no adenopathy JVP normal no bruits no thyromegaly Lungs clear with no wheezing and good diaphragmatic motion Heart:  S1/S2 no murmur, no rub, gallop or click PMI normal Abdomen: benighn, BS positve, no tenderness, no AAA no bruit.  No HSM or HJR Distal pulses intact with no bruits No edema Neuro non-focal Skin warm and dry No muscular weakness   Labs:   Lab Results  Component Value Date   WBC 4.6 08/14/2015   HGB 11.4* 08/14/2015   HCT 33.8* 08/14/2015   MCV 92.1 08/14/2015   PLT 184 08/14/2015    Recent Labs Lab 08/13/15 2122  NA 129*  K 4.4  CL 98*  CO2 25  BUN 7  CREATININE 0.76  CALCIUM 9.0  PROT 6.7  BILITOT 1.1  ALKPHOS 65  ALT 61  AST 42*  GLUCOSE 105*   Lab Results  Component Value Date   TROPONINI <0.03 08/14/2015    Lab Results  Component Value Date   CHOL 161 12/29/2013   CHOL 157 05/26/2013   CHOL 145 08/17/2011   Lab Results  Component Value Date   HDL 34* 12/29/2013   HDL 34* 05/26/2013   HDL 33.00* 08/17/2011   Lab Results  Component Value Date   LDLCALC 97 12/29/2013   LDLCALC 101* 05/26/2013   LDLCALC 105* 08/17/2011   Lab Results  Component Value Date   TRIG 148 12/29/2013   TRIG 111 05/26/2013   TRIG 35.0 08/17/2011   Lab Results  Component Value Date   CHOLHDL 4.7 12/29/2013   CHOLHDL 4.6 05/26/2013   CHOLHDL 4 08/17/2011   No results found for: LDLDIRECT    Radiology: Dg Chest 2 View  08/13/2015  CLINICAL DATA:  75 year old male chest pain EXAM: CHEST  2 VIEW COMPARISON:  Chest radiograph dated 08/07/2014 FINDINGS: Two views of the chest demonstrate emphysematous changes of the lungs. There is no focal consolidation, pleural effusion, or pneumothorax. Minimal bibasilar atelectatic changes noted. The cardiac silhouette is within normal limits. No acute osseous pathology. IMPRESSION: No active cardiopulmonary disease. Electronically  Signed   By: Anner Crete M.D.   On: 08/13/2015 19:18    EKG:  NSR normal no ST/T wave changes    ASSESSMENT AND PLAN:  Dyspnea:  Etiology not clear normal lung exam soft SEM CXR NAD  Check D dimer.  Echo for RV/LV function Chest Pain:  Resolved no ECG changes negative enzymes f/u exercise myovue Prostate:  Follow PSA continue flomax and proscar  Ok to d/c latter today if d dimer, echo and myovue ok   Signed: Jenkins Rouge 08/14/2015, 7:25 AM

## 2015-08-14 NOTE — Progress Notes (Signed)
[facility]                                                                              Patient Demographics  Shawn Gaines, is a 75 y.o. male, DOB - [birth year], [id number]  Admit date - 08/13/2015   Admitting Physician Norval Morton, MD  Outpatient Primary MD for the patient is Gildardo Cranker, DO  Outpatient specialists:   LOS -   days    Chief Complaint  Patient presents with  . Chest Pain  . Shortness of Breath       Brief summary   Patient is a 75 year old male with hypertension, BPH, seasonal allergies presented with shortness of breath. Patient reported that he was doing some light house chores, day before had mowed his lawn and heavy lifting. He also reported mild shortness of breath and nausea, dizziness and feeling of heaviness over his middle of the chest with pressure. He recently had difficulty urination and follows Dr. Era Bumpers and was placed on Flomax, finasteride and Azo. Troponins were negative, patient was admitted for further workup.   Assessment & Plan      Atypical chest pain with dyspnea - Risk factors includes hypertension. Currently resolved. Etiology not clear. - Troponins negative so far. EKG showed no ST-T wave changes suggestive of ischemia - D-dimer pending - Cardiology recommended 2-D echo and stress test. - 2-D echo showed EF of 55%, possible distal septal hypokinesis  Reported Bradycardia/ Frequent PVCs -  follow stress test, not on any rate blocking medications - Currently asymptomatic, EKG did not show any heart blocks. Outpatient follow-up with cardiology  Anemia hemoglobin 11.6 on admission. Hemoglobin around 12 -13  Hyponatremia: Acute on chronic. Sodium 126 on admission  -Resolved, likely hypovolemic hyponatremia due to dehydration   Seasonal allergies - Continue singular, flonase  Benign Prostatic Hypertrophy - continue tamsulosin and finasteride  Dysuria Negative for any UTI. Patient follows urology  outpatient, Dr. Eppie Gibson - Continue Protonix  Code Status: Full CODE STATUS DVT Prophylaxis:  Lovenox  Family Communication: Discussed in detail with the patient, all imaging results, lab results explained to the patient    Disposition Plan hopefully today  Time Spent in minutes   25 minutes  Procedures:  echo  Consultants:   cardiology  Antimicrobials:      Medications  Scheduled Meds: . aspirin EC  81 mg Oral Daily  . cefTRIAXone (ROCEPHIN)  IV  1 g Intravenous Q24H  . enoxaparin (LOVENOX) injection  40 mg Subcutaneous Q24H  . finasteride  5 mg Oral Daily  . fluticasone  2 spray Each Nare Daily  . montelukast  10 mg Oral QHS  . pantoprazole  40 mg Oral Daily  . tamsulosin  0.4 mg Oral Daily   Continuous Infusions: . sodium chloride 1,000 mL (08/14/15 0957)   PRN Meds:.acetaminophen, benzonatate, gi cocktail, ipratropium-albuterol, morphine injection, ondansetron (ZOFRAN) IV   Antibiotics   Anti-infectives    Start     Dose/Rate Route Frequency Ordered Stop   08/14/15 0600  cefTRIAXone (ROCEPHIN) 1 g in dextrose 5 % 50 mL IVPB     1 g 100 mL/hr  over 30 Minutes Intravenous Every 24 hours 08/14/15 0556          Subjective:   Shawn Gaines was seen and examined today.  Denies any specific complaints, no chest pain or shortness of breath. Patient denies dizziness, abdominal pain, N/V/D/C, new weakness, numbess, tingling. No acute events overnight.  No dysuria or hematuria.  Objective:   Filed Vitals:   08/13/15 2000 08/13/15 2015 08/13/15 2100 08/14/15 0532  BP: 128/89 137/88 130/73 110/69  Pulse: 63 58 61 60  Temp:   97.4 F (36.3 C) 97.7 F (36.5 C)  TempSrc:   Oral Oral  Resp: 21 16 16 16   Height:   6' (1.829 m)   Weight:   83.28 kg (183 lb 9.6 oz) 81.693 kg (180 lb 1.6 oz)  SpO2: 97% 99% 100% 99%    Intake/Output Summary (Last 24 hours) at 08/14/15 1153 Last data filed at 08/14/15 [id number]  Gross per 24 hour  Intake 923.75 ml  Output    2400 ml  Net -1476.25 ml     Wt Readings from Last 3 Encounters:  08/14/15 81.693 kg (180 lb 1.6 oz)  10/01/14 83.462 kg (184 lb)  04/27/14 87.091 kg (192 lb)     Exam  General: Alert and oriented x 3, NAD  HEENT:  PERRLA, EOMI, Anicteric Sclera, mucous membranes moist.   Neck: Supple, no JVD, no masses  Cardiovascular: S1 S2 auscultated, no rubs, murmurs or gallops. Regular rate and rhythm.  Respiratory: Clear to auscultation bilaterally, no wheezing, rales or rhonchi  Gastrointestinal: Soft, nontender, nondistended, + bowel sounds  Ext: no cyanosis clubbing or edema  Neuro: AAOx3, Cr N's II- XII. Strength 5/5 upper and lower extremities bilaterally  Skin: No rashes  Psych: Normal affect and demeanor, alert and oriented x3    Data Reviewed:  I have personally reviewed following labs and imaging studies  Micro Results No results found for this or any previous visit (from the past 240 hour(s)).  Radiology Reports Dg Chest 2 View  08/13/2015  CLINICAL DATA:  75 year old male chest pain EXAM: CHEST  2 VIEW COMPARISON:  Chest radiograph dated 08/07/2014 FINDINGS: Two views of the chest demonstrate emphysematous changes of the lungs. There is no focal consolidation, pleural effusion, or pneumothorax. Minimal bibasilar atelectatic changes noted. The cardiac silhouette is within normal limits. No acute osseous pathology. IMPRESSION: No active cardiopulmonary disease. Electronically Signed   By: Anner Crete M.D.   On: 08/13/2015 19:18    Lab Data:  CBC:  Recent Labs Lab 08/13/15 1748 08/14/15 0400  WBC 5.0 4.6  NEUTROABS  --  2.5  HGB 11.6* 11.4*  HCT 34.2* 33.8*  MCV 92.2 92.1  PLT 204 [id number]   Basic Metabolic Panel:  Recent Labs Lab 08/13/15 1748 08/13/15 2122 08/14/15 0620  NA 126* 129* 134*  K 4.1 4.4 4.1  CL 97* 98* 104  CO2 23 25 24   GLUCOSE 107* 105* 94  BUN 7 7 7   CREATININE 0.80 0.76 0.77  CALCIUM 8.6* 9.0 8.7*  MG 1.9  --   --     GFR: Estimated Creatinine Clearance: 86.2 mL/min (by C-G formula based on Cr of 0.77). Liver Function Tests:  Recent Labs Lab 08/13/15 2122  AST 42*  ALT 61  ALKPHOS 65  BILITOT 1.1  PROT 6.7  ALBUMIN 3.9   No results for input(s): LIPASE, AMYLASE in the last 168 hours. No results for input(s): AMMONIA in the last 168 hours. Coagulation Profile: No results for  input(s): INR, PROTIME in the last 168 hours. Cardiac Enzymes:  Recent Labs Lab 08/13/15 2122 08/14/15 0018 08/14/15 0400  TROPONINI <0.03 <0.03 <0.03   BNP (last 3 results) No results for input(s): PROBNP in the last 8760 hours. HbA1C: No results for input(s): HGBA1C in the last 72 hours. CBG: No results for input(s): GLUCAP in the last 168 hours. Lipid Profile:  Recent Labs  08/14/15 1009  CHOL 154  HDL 33*  LDLCALC 101*  TRIG 100  CHOLHDL 4.7   Thyroid Function Tests: No results for input(s): TSH, T4TOTAL, FREET4, T3FREE, THYROIDAB in the last 72 hours. Anemia Panel: No results for input(s): VITAMINB12, FOLATE, FERRITIN, TIBC, IRON, RETICCTPCT in the last 72 hours. Urine analysis:    Component Value Date/Time   COLORURINE YELLOW 08/14/2015 [place] 08/14/2015 0841   LABSPEC 1.008 08/14/2015 0841   PHURINE 7.5 08/14/2015 0841   GLUCOSEU NEGATIVE 08/14/2015 0841   HGBUR NEGATIVE 08/14/2015 0841   HGBUR negative 03/24/2007 1449   BILIRUBINUR NEGATIVE 08/14/2015 0841   BILIRUBINUR n 08/17/2011 1334   KETONESUR NEGATIVE 08/14/2015 0841   PROTEINUR NEGATIVE 08/14/2015 0841   PROTEINUR n 08/17/2011 1334   UROBILINOGEN 0.2 02/12/2015 1548   UROBILINOGEN 1.0 08/17/2011 1334   NITRITE NEGATIVE 08/14/2015 0841   NITRITE n 08/17/2011 1334   LEUKOCYTESUR NEGATIVE 08/14/2015 0841     Raider Valbuena M.D. [facility] 08/14/2015, 11:53 AM  Pager: [phone] Between 7am to 7pm - call Pager - [phone]  After 7pm go to [email or web address] - password [email or web address]  Call night coverage  person covering after 7pm

## 2015-08-14 NOTE — Plan of Care (Signed)
Problem: Discharge Progression Outcomes Goal: No anginal pain Outcome: Completed/Met Date Met:  08/14/15 Pt remains free from chest pain   Problem: Safety: Goal: Ability to remain free from injury will improve Outcome: Completed/Met Date Met:  08/14/15 Pt has remained free from falls during this admission   Problem: Pain Managment: Goal: General experience of comfort will improve Outcome: Completed/Met Date Met:  08/14/15 Pt remains pain free  Problem: Fluid Volume: Goal: Ability to maintain a balanced intake and output will improve Outcome: Completed/Met Date Met:  08/14/15 Pt has adequate intake and output   Problem: Nutrition: Goal: Adequate nutrition will be maintained Outcome: Completed/Met Date Met:  08/14/15 Pt able to tolerate current diet with no difficulties

## 2015-08-14 NOTE — Discharge Summary (Addendum)
Physician Discharge Summary   Patient ID: Shawn Gaines MRN: [id number] DOB/AGE: 03-08-1940 75 y.o.  Admit date: 08/13/2015 Discharge date: 08/14/2015  Primary Care Physician:  Gildardo Cranker, DO  Discharge Diagnoses:    . Chest pain . Dyspnea . Essential hypertension . BPH (benign prostatic hyperplasia) . Seasonal allergies  Consults: Cardiology  Recommendations for Outpatient Follow-up:  1. Please repeat CBC/BMET at next visit   DIET: Heart healthy diet    Allergies:   Allergies  Allergen Reactions  . Doxycycline     Loss of balance & fatigue  . Doxycycline Hyclate Nausea Only  . Lactose Intolerance (Gi) Other (See Comments)     DISCHARGE MEDICATIONS: Current Discharge Medication List    CONTINUE these medications which have CHANGED   Details  pantoprazole (PROTONIX) 40 MG tablet Take 1 tablet (40 mg total) by mouth daily. Take 1 tablet daily for acid reflux and cough Qty: 30 tablet, Refills: 5   Associated Diagnoses: Gastroesophageal reflux disease, esophagitis presence not specified      CONTINUE these medications which have NOT CHANGED   Details  aspirin EC 81 MG tablet Take 81 mg by mouth daily.    B Complex-C (SUPER B COMPLEX PO) Take 1 tablet by mouth daily.     benzonatate (TESSALON) 100 MG capsule Take 100 mg by mouth 3 (three) times daily as needed for cough.    Cholecalciferol (VITAMIN D3) 5000 UNITS TABS Take 1 tablet by mouth daily.     finasteride (PROSCAR) 5 MG tablet Take 5 mg by mouth daily.     Flax Oil-Fish Oil-Borage Oil (FISH OIL-FLAX OIL-BORAGE OIL PO) Take 1 tablet by mouth.    fluticasone (FLONASE) 50 MCG/ACT nasal spray Place 2 sprays into the nose daily.    Lactobacillus Acid-Pectin (ACIDOPHILUS PLUS PECTIN) TABS Take 1 tablet by mouth daily.     Magnesium 250 MG TABS Take 250 mg by mouth daily.     montelukast (SINGULAIR) 10 MG tablet Take 10 mg by mouth at bedtime.    OVER THE COUNTER MEDICATION Take 1 tablet by mouth  daily.    Tamsulosin HCl (FLOMAX) 0.4 MG CAPS Take 0.4 mg by mouth daily.         Brief H and P: For complete details please refer to admission H and P, but in briefPatient is a 75 year old male with hypertension, BPH, seasonal allergies presented with shortness of breath. Patient reported that he was doing some light house chores, day before had mowed his lawn and heavy lifting. He also reported mild shortness of breath and nausea, dizziness and feeling of heaviness over his middle of the chest with pressure. He recently had difficulty urination and follows Dr. Era Bumpers and was placed on Flomax, finasteride and Azo. Troponins were negative, patient was admitted for further workup.  Hospital Course:   Atypical chest pain with dyspnea - Risk factors includes hypertension. Currently resolved. Etiology not clear. - Troponins negative so far. EKG showed no ST-T wave changes suggestive of ischemia - Cardiology recommended 2-D echo and stress test. - 2-D echo showed EF of 55%, possible distal septal hypokinesis NM stress test showed EF 56%, no reversible ischemia or infarction, normal LV wall motion - d dimer normal   Reported Bradycardia/ Frequent PVCs - not on any rate blocking medications - Currently asymptomatic, EKG did not show any heart blocks. Outpatient follow-up with cardiology  Anemia hemoglobin 11.6 on admission. Hemoglobin around 12 -13  Hyponatremia: Acute on chronic. Sodium 126 on admission  -  Resolved, likely hypovolemic hyponatremia due to dehydration   Seasonal allergies - Continue singular, flonase  Benign Prostatic Hypertrophy - continue tamsulosin and finasteride  Dysuria Negative for any UTI. Patient follows urology outpatient, Dr. Eppie Gibson - Continue Protonix  Day of Discharge BP 156/98 mmHg  Pulse 60  Temp(Src) 97.7 F (36.5 C) (Oral)  Resp 16  Ht 6' (1.829 m)  Wt 81.693 kg (180 lb 1.6 oz)  BMI 24.42 kg/m2  SpO2 99%  Physical  Exam: General: Alert and awake oriented x3 not in any acute distress. HEENT: anicteric sclera, pupils reactive to light and accommodation CVS: S1-S2 clear no murmur rubs or gallops Chest: clear to auscultation bilaterally, no wheezing rales or rhonchi Abdomen: soft nontender, nondistended, normal bowel sounds Extremities: no cyanosis, clubbing or edema noted bilaterally Neuro: Cranial nerves II-XII intact, no focal neurological deficits   The results of significant diagnostics from this hospitalization (including imaging, microbiology, ancillary and laboratory) are listed below for reference.    LAB RESULTS: Basic Metabolic Panel:  Recent Labs Lab 08/13/15 1748 08/13/15 2122 08/14/15 0620  NA 126* 129* 134*  K 4.1 4.4 4.1  CL 97* 98* 104  CO2 23 25 24   GLUCOSE 107* 105* 94  BUN 7 7 7   CREATININE 0.80 0.76 0.77  CALCIUM 8.6* 9.0 8.7*  MG 1.9  --   --    Liver Function Tests:  Recent Labs Lab 08/13/15 2122  AST 42*  ALT 61  ALKPHOS 65  BILITOT 1.1  PROT 6.7  ALBUMIN 3.9   No results for input(s): LIPASE, AMYLASE in the last 168 hours. No results for input(s): AMMONIA in the last 168 hours. CBC:  Recent Labs Lab 08/13/15 1748 08/14/15 0400  WBC 5.0 4.6  NEUTROABS  --  2.5  HGB 11.6* 11.4*  HCT 34.2* 33.8*  MCV 92.2 92.1  PLT 204 184   Cardiac Enzymes:  Recent Labs Lab 08/14/15 0018 08/14/15 0400  TROPONINI <0.03 <0.03   BNP: Invalid input(s): POCBNP CBG: No results for input(s): GLUCAP in the last 168 hours.  Significant Diagnostic Studies:  Dg Chest 2 View  08/13/2015  CLINICAL DATA:  75 year old male chest pain EXAM: CHEST  2 VIEW COMPARISON:  Chest radiograph dated 08/07/2014 FINDINGS: Two views of the chest demonstrate emphysematous changes of the lungs. There is no focal consolidation, pleural effusion, or pneumothorax. Minimal bibasilar atelectatic changes noted. The cardiac silhouette is within normal limits. No acute osseous pathology.  IMPRESSION: No active cardiopulmonary disease. Electronically Signed   By: Anner Crete M.D.   On: 08/13/2015 19:18    2D ECHO: Study Conclusions  - Left ventricle: The cavity size was normal. Systolic function was  normal. The estimated ejection fraction was 55%. Cannot exclude  distal septal hypokinesis. Left ventricular diastolic function  parameters were normal. - Aortic valve: There was mild regurgitation. - Mitral valve: There was trivial regurgitation. - Left atrium: The atrium was severely dilated. - Atrial septum: There was increased thickness of the septum,  consistent with lipomatous hypertrophy. - Tricuspid valve: There was trivial regurgitation.  NM stress test IMPRESSION: 1. No reversible ischemia or infarction.  2. Normal left ventricular wall motion.  3. Left ventricular ejection fraction 56%  4. Non invasive risk stratification*: Low   Disposition and Follow-up:    DISPOSITION: home    DISCHARGE FOLLOW-UP Follow-up Information    Follow up with Gildardo Cranker, DO. Schedule an appointment as soon as possible for a visit in 2 weeks.  Specialty:  Internal Medicine   Why:  for hospital follow-up   Contact information:   [place]  [id number] [phone]        Time spent on Discharge: 77mins   Signed:   Isabell Bonafede M.D. [facility] 08/14/2015, 3:01 PM Pager: [phone]

## 2015-08-15 LAB — URINE CULTURE

## 2015-08-16 DIAGNOSIS — D649 Anemia, unspecified: Secondary | ICD-10-CM | POA: Diagnosis not present

## 2015-08-16 LAB — URINE CULTURE: Culture: >=100000 — AB

## 2015-09-02 DIAGNOSIS — E559 Vitamin D deficiency, unspecified: Secondary | ICD-10-CM | POA: Diagnosis not present

## 2015-09-02 DIAGNOSIS — R5383 Other fatigue: Secondary | ICD-10-CM | POA: Diagnosis not present

## 2015-09-02 DIAGNOSIS — E871 Hypo-osmolality and hyponatremia: Secondary | ICD-10-CM | POA: Diagnosis not present

## 2015-09-02 DIAGNOSIS — Z6825 Body mass index (BMI) 25.0-25.9, adult: Secondary | ICD-10-CM | POA: Diagnosis not present

## 2015-09-02 DIAGNOSIS — K5900 Constipation, unspecified: Secondary | ICD-10-CM | POA: Diagnosis not present

## 2015-09-02 DIAGNOSIS — R69 Illness, unspecified: Secondary | ICD-10-CM | POA: Diagnosis not present

## 2015-09-02 DIAGNOSIS — D6489 Other specified anemias: Secondary | ICD-10-CM | POA: Diagnosis not present

## 2015-09-12 DIAGNOSIS — R69 Illness, unspecified: Secondary | ICD-10-CM | POA: Diagnosis not present

## 2015-09-12 DIAGNOSIS — Z6825 Body mass index (BMI) 25.0-25.9, adult: Secondary | ICD-10-CM | POA: Diagnosis not present

## 2015-09-12 DIAGNOSIS — R300 Dysuria: Secondary | ICD-10-CM | POA: Diagnosis not present

## 2015-09-12 DIAGNOSIS — D649 Anemia, unspecified: Secondary | ICD-10-CM | POA: Diagnosis not present

## 2015-09-12 DIAGNOSIS — R0789 Other chest pain: Secondary | ICD-10-CM | POA: Diagnosis not present

## 2015-09-12 DIAGNOSIS — E871 Hypo-osmolality and hyponatremia: Secondary | ICD-10-CM | POA: Diagnosis not present

## 2015-09-12 DIAGNOSIS — I493 Ventricular premature depolarization: Secondary | ICD-10-CM | POA: Diagnosis not present

## 2015-10-06 DIAGNOSIS — R197 Diarrhea, unspecified: Secondary | ICD-10-CM | POA: Diagnosis not present

## 2015-10-06 DIAGNOSIS — Z6825 Body mass index (BMI) 25.0-25.9, adult: Secondary | ICD-10-CM | POA: Diagnosis not present

## 2015-10-14 DIAGNOSIS — Z6825 Body mass index (BMI) 25.0-25.9, adult: Secondary | ICD-10-CM | POA: Diagnosis not present

## 2015-10-14 DIAGNOSIS — R197 Diarrhea, unspecified: Secondary | ICD-10-CM | POA: Diagnosis not present

## 2015-10-14 DIAGNOSIS — R69 Illness, unspecified: Secondary | ICD-10-CM | POA: Diagnosis not present

## 2015-10-14 DIAGNOSIS — R5383 Other fatigue: Secondary | ICD-10-CM | POA: Diagnosis not present

## 2015-10-14 DIAGNOSIS — J302 Other seasonal allergic rhinitis: Secondary | ICD-10-CM | POA: Diagnosis not present

## 2015-10-18 DIAGNOSIS — L602 Onychogryphosis: Secondary | ICD-10-CM | POA: Diagnosis not present

## 2015-10-19 DIAGNOSIS — K219 Gastro-esophageal reflux disease without esophagitis: Secondary | ICD-10-CM | POA: Diagnosis not present

## 2015-10-19 DIAGNOSIS — J310 Chronic rhinitis: Secondary | ICD-10-CM | POA: Diagnosis not present

## 2015-10-19 DIAGNOSIS — R1312 Dysphagia, oropharyngeal phase: Secondary | ICD-10-CM | POA: Diagnosis not present

## 2015-10-19 DIAGNOSIS — R42 Dizziness and giddiness: Secondary | ICD-10-CM | POA: Diagnosis not present

## 2015-10-20 ENCOUNTER — Other Ambulatory Visit: Payer: Self-pay | Admitting: Otolaryngology

## 2015-10-20 DIAGNOSIS — R1310 Dysphagia, unspecified: Secondary | ICD-10-CM

## 2015-10-24 ENCOUNTER — Ambulatory Visit
Admission: RE | Admit: 2015-10-24 | Discharge: 2015-10-24 | Disposition: A | Discharge status: Home / Self Care | Payer: Medicare HMO | Source: Ambulatory Visit | Attending: Otolaryngology | Admitting: Otolaryngology

## 2015-10-24 DIAGNOSIS — R1310 Dysphagia, unspecified: Secondary | ICD-10-CM | POA: Diagnosis not present

## 2015-10-24 DIAGNOSIS — K224 Dyskinesia of esophagus: Secondary | ICD-10-CM | POA: Diagnosis not present

## 2015-11-14 DIAGNOSIS — Z23 Encounter for immunization: Secondary | ICD-10-CM | POA: Diagnosis not present

## 2015-12-19 ENCOUNTER — Ambulatory Visit (INDEPENDENT_AMBULATORY_CARE_PROVIDER_SITE_OTHER): Payer: Medicare HMO | Admitting: Pulmonary Disease

## 2015-12-19 ENCOUNTER — Encounter: Payer: Self-pay | Admitting: Pulmonary Disease

## 2015-12-19 ENCOUNTER — Ambulatory Visit (INDEPENDENT_AMBULATORY_CARE_PROVIDER_SITE_OTHER)
Admission: RE | Admit: 2015-12-19 | Discharge: 2015-12-19 | Disposition: A | Discharge status: Home / Self Care | Payer: Medicare HMO | Source: Ambulatory Visit | Attending: Pulmonary Disease | Admitting: Pulmonary Disease

## 2015-12-19 DIAGNOSIS — R0602 Shortness of breath: Secondary | ICD-10-CM | POA: Diagnosis not present

## 2015-12-19 DIAGNOSIS — R05 Cough: Secondary | ICD-10-CM | POA: Diagnosis not present

## 2015-12-19 NOTE — Progress Notes (Signed)
Shawn Gaines    ES:[id number]    1940-04-30  Primary Care Physician:HOLWERDA, Shawn Reaper, MD  Referring Physician: Velna Hatchet, MD [address] [place], [place] [address]  Chief complaint:  Consult for evaluation of cough.  HPI: Mr. Gaines is a 75 year old with past medical history of hypertension, BPH, seasonal allergies. He was seen in the pulmonary office in 2015 for dyspnea. At that time he had a normal chest x-Shawn and spirometry. He was admitted in June of this year for atypical chest pain with dyspnea. He had 2-D echo that showed EF of 55%, possible distal septal hypokinesis. NM stress test showed EF 56%, no reversible ischemia or infarction, normal LV wall motion. d dimer was normal. He has history of seasonal allergies, postnasal drip. He also has acid reflux and mild esophageal dysmotility.  As per note from Dr. Erik Gaines, ENT dated [id number] Pt evaluated for multifactorial dizziness without vertigo or in a year component. He has some dysphagia which is likely reflux related. He'll be sent for a barium swallow and tried on Prilosec. He is physiologic postnasal drip with some seasonal allergies but avoiding antihistamines as it may make postnasal drainage worse. Nodes.   Outpatient Encounter Prescriptions as of 12/19/2015  Medication Sig  . aspirin EC 81 MG tablet Take 81 mg by mouth daily.  . B Complex-C (SUPER B COMPLEX PO) Take 1 tablet by mouth daily.   . benzonatate (TESSALON) 100 MG capsule Take 100 mg by mouth 3 (three) times daily as needed for cough.  . calcium-vitamin D (OSCAL WITH D) 500-200 MG-UNIT tablet Take 1 tablet by mouth.  . Cholecalciferol (VITAMIN D3) 5000 UNITS TABS Take 1 tablet by mouth daily.   . finasteride (PROSCAR) 5 MG tablet Take 5 mg by mouth daily.   . Flax Oil-Fish Oil-Borage Oil (FISH OIL-FLAX OIL-BORAGE OIL PO) Take 1 tablet by mouth.  . fluticasone (FLONASE) 50 MCG/ACT nasal spray Place 2 sprays into the nose daily.  . Lactobacillus  Acid-Pectin (ACIDOPHILUS PLUS PECTIN) TABS Take 1 tablet by mouth daily.   Marland Kitchen lactobacillus acidophilus (BACID) TABS tablet Take 2 tablets by mouth 3 (three) times daily.  . Magnesium 100 MG CAPS Take by mouth.  . Magnesium 250 MG TABS Take 250 mg by mouth daily.   . montelukast (SINGULAIR) 10 MG tablet Take 10 mg by mouth at bedtime.  . Multiple Vitamins-Minerals (CENTRUM SILVER 50+MEN PO) Take 1 tablet by mouth.  Marland Kitchen OVER THE COUNTER MEDICATION Take 1 tablet by mouth daily.  . pantoprazole (PROTONIX) 40 MG tablet Take 1 tablet (40 mg total) by mouth daily. Take 1 tablet daily for acid reflux and cough  . Tamsulosin HCl (FLOMAX) 0.4 MG CAPS Take 0.4 mg by mouth daily.   No facility-administered encounter medications on file as of 12/19/2015.     Allergies as of 12/19/2015 - Review Complete 12/19/2015  Allergen Reaction Noted  . Doxycycline  08/27/2011  . Doxycycline hyclate Nausea Only 08/13/2015  . Lactose intolerance (gi) Other (See Comments) 05/04/2011    Past Medical History:  Diagnosis Date  . Arthritis    Knee  . Breathing problem   . Cancer ([place])    skin cancer- R Buttocks-bx was postive - pt not sure what kind of cancer- for removeal in March  . Colon polyps    TWO HYPERPLASTIC POLYPS  . Prostatic hypertrophy   . Rotator cuff disorder    BIL  . Shortness of breath at rest   .  Sinus trouble   . Unspecified essential hypertension 05/26/2013    Past Surgical History:  Procedure Laterality Date  . COLONOSCOPY  2014   normal  . JOINT REPLACEMENT  2009   R Knee  . KNEE ARTHROPLASTY  2009   right  . TONSILLECTOMY    . TOTAL KNEE ARTHROPLASTY  05/04/2011   Procedure: TOTAL KNEE ARTHROPLASTY;  Surgeon: Kerin Salen, MD;  Location: [facility];  Service: Orthopedics;  Laterality: Left;  DEPUY    Family History  Problem Relation Age of Onset  . Arthritis Mother   . Anesthesia problems Neg Hx   . Colon cancer Neg Hx     Social History   Social History  . Marital status:  Divorced    Spouse name: N/A  . Number of children: 1  . Years of education: N/A   Occupational History  . Retired    Social History Main Topics  . Smoking status: Former Smoker    Years: 5.00    Types: Pipe    Quit date: 08/18/1973  . Smokeless tobacco: Never Used     Comment: Cigs in teens--occassional "tried"---Smoked a pipe x 4-5 years.  . Alcohol use 1.2 - 1.8 oz/week    1 - 2 Standard drinks or equivalent, 1 Glasses of wine per week  . Drug use: No  . Sexual activity: Not on file   Other Topics Concern  . 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  HENT: Negative.   Eyes: Negative for blurred vision.  Respiratory: as per HPI  Cardiovascular: Negative for chest pain and palpitations.  Gastrointestinal: Negative for vomiting, diarrhea, blood per rectum. Genitourinary: Negative for dysuria, urgency, frequency and hematuria.  Musculoskeletal: Negative for myalgias, back pain and joint pain.  Skin: Negative for itching and rash.  Neurological: Negative for dizziness, tremors, focal weakness, seizures and loss of consciousness.  Endo/Heme/Allergies: Negative for environmental allergies.  Psychiatric/Behavioral: Negative for depression, suicidal ideas and hallucinations.  All other systems reviewed and are negative.   Physical Exam: Blood pressure 122/80, pulse 87, height 6' (1.829 m), weight 188 lb (85.3 kg), SpO2 97 %. Gen:      No acute distress HEENT:  EOMI, sclera anicteric Neck:     No masses; no thyromegaly Lungs:    Clear to auscultation bilaterally; normal respiratory effort CV:         Regular rate and rhythm; no murmurs Abd:      + bowel sounds; soft, non-tender; no palpable  masses, no distension Ext:    No edema; adequate peripheral perfusion Skin:      Warm and dry; no rash Neuro: alert and oriented x 3 Psych: normal mood and affect  Data Reviewed: Esophagogram 1. Mild to moderate esophageal dysmotility, likely presbyesophagus. 2. Tiny hiatal hernia. 3. Delayed passage of barium tablet, likely due to dysmotility.  CXR 08/13/15 - Hyperinflation, no acute disease. Images reviewed.  PFTs 03/19/13 FVC 4.67 [98%] FEV1 3.41 [95%) F/F 73 Normal spirometry  Assessment:  Evaluation for chronic cough. Mr. Kochanski had a normal pulmonary eval couple of years ago with a normal chest x-Gorje and PFTs. But symptoms of cough persists. This is likely from a  combination of GERD, esophageal dysmotility, postnasal drip. He is being followed by ENT for these issues. He had an unremarkable cardiac eval earlier this year.  I would like to evaluate again with a full set of PFTs and a chest x-De today. He has symptoms of sleep apnea and will be scheduled for a sleep study. He will continue on the Flonase and Protonix.  Plan/Recommendations: - PFTs, CXR - Sleep study  Marshell Garfinkel MD [facility] Pager [phone] 12/19/2015, 4:55 PM  CC: Shawn Hatchet, MD

## 2015-12-19 NOTE — Patient Instructions (Signed)
We will get chest x-Finn today. He'll be scheduled for pulmonary function tests and a sleep study. We'll get in touch with Dr. Erik Obey to get his assessment of your reflux.  Return in one month.

## 2015-12-26 ENCOUNTER — Encounter: Payer: Self-pay | Admitting: Pulmonary Disease

## 2015-12-30 ENCOUNTER — Other Ambulatory Visit: Payer: Self-pay

## 2015-12-30 DIAGNOSIS — N390 Urinary tract infection, site not specified: Secondary | ICD-10-CM | POA: Diagnosis not present

## 2015-12-30 DIAGNOSIS — D6489 Other specified anemias: Secondary | ICD-10-CM | POA: Diagnosis not present

## 2015-12-30 DIAGNOSIS — R0602 Shortness of breath: Secondary | ICD-10-CM

## 2015-12-30 DIAGNOSIS — R69 Illness, unspecified: Secondary | ICD-10-CM | POA: Diagnosis not present

## 2015-12-30 DIAGNOSIS — R8299 Other abnormal findings in urine: Secondary | ICD-10-CM | POA: Diagnosis not present

## 2016-01-07 ENCOUNTER — Encounter (HOSPITAL_COMMUNITY): Payer: Self-pay | Admitting: Emergency Medicine

## 2016-01-07 ENCOUNTER — Emergency Department (HOSPITAL_COMMUNITY): Payer: Medicare HMO

## 2016-01-07 ENCOUNTER — Emergency Department (HOSPITAL_COMMUNITY)
Admission: EM | Admit: 2016-01-07 | Discharge: 2016-01-08 | Disposition: A | Discharge status: Home / Self Care | Payer: Medicare HMO | Attending: Emergency Medicine | Admitting: Emergency Medicine

## 2016-01-07 DIAGNOSIS — I10 Essential (primary) hypertension: Secondary | ICD-10-CM | POA: Diagnosis not present

## 2016-01-07 DIAGNOSIS — Z79899 Other long term (current) drug therapy: Secondary | ICD-10-CM | POA: Diagnosis not present

## 2016-01-07 DIAGNOSIS — Y939 Activity, unspecified: Secondary | ICD-10-CM | POA: Diagnosis not present

## 2016-01-07 DIAGNOSIS — Z7982 Long term (current) use of aspirin: Secondary | ICD-10-CM | POA: Diagnosis not present

## 2016-01-07 DIAGNOSIS — S0990XA Unspecified injury of head, initial encounter: Secondary | ICD-10-CM | POA: Insufficient documentation

## 2016-01-07 DIAGNOSIS — R42 Dizziness and giddiness: Secondary | ICD-10-CM | POA: Diagnosis not present

## 2016-01-07 DIAGNOSIS — Y999 Unspecified external cause status: Secondary | ICD-10-CM | POA: Diagnosis not present

## 2016-01-07 DIAGNOSIS — Z85828 Personal history of other malignant neoplasm of skin: Secondary | ICD-10-CM | POA: Insufficient documentation

## 2016-01-07 DIAGNOSIS — R404 Transient alteration of awareness: Secondary | ICD-10-CM | POA: Diagnosis not present

## 2016-01-07 DIAGNOSIS — Z87891 Personal history of nicotine dependence: Secondary | ICD-10-CM | POA: Insufficient documentation

## 2016-01-07 DIAGNOSIS — Z96651 Presence of right artificial knee joint: Secondary | ICD-10-CM | POA: Insufficient documentation

## 2016-01-07 DIAGNOSIS — R918 Other nonspecific abnormal finding of lung field: Secondary | ICD-10-CM | POA: Diagnosis not present

## 2016-01-07 DIAGNOSIS — W2201XA Walked into wall, initial encounter: Secondary | ICD-10-CM | POA: Diagnosis not present

## 2016-01-07 DIAGNOSIS — R55 Syncope and collapse: Secondary | ICD-10-CM | POA: Diagnosis not present

## 2016-01-07 DIAGNOSIS — Y9289 Other specified places as the place of occurrence of the external cause: Secondary | ICD-10-CM | POA: Insufficient documentation

## 2016-01-07 DIAGNOSIS — R51 Headache: Secondary | ICD-10-CM | POA: Diagnosis not present

## 2016-01-07 LAB — CBG MONITORING, ED: Glucose-Capillary: 110 mg/dL — ABNORMAL HIGH (ref 65–99)

## 2016-01-07 MED ORDER — SODIUM CHLORIDE 0.9 % IV BOLUS (SEPSIS)
1000.0000 mL | Freq: Once | INTRAVENOUS | Status: AC
Start: 2016-01-07 — End: 2016-01-08
  Administered 2016-01-07: 1000 mL via INTRAVENOUS

## 2016-01-07 NOTE — ED Notes (Signed)
Pt unable to void 

## 2016-01-07 NOTE — ED Triage Notes (Signed)
Per [facility]   Pt got up from watching tv and was going to go to bed and remembers waking up on the floor. It is possible that the pt hit his head on the dry wall. Syncopal episode. Denies neck and back pain, intermittent SOB and dizziness.

## 2016-01-07 NOTE — ED Provider Notes (Signed)
[facility] DEPT Provider Note   CSN: [id number] Arrival date & time: 01/07/16  2240  By signing my name below, I, Soijett Blue, attest that this documentation has been prepared under the direction and in the presence of Everlene Balls, MD. Electronically Signed: Soijett Blue, ED Scribe. 01/07/16. 11:18 PM.  History   Chief Complaint Chief Complaint  Patient presents with  . Loss of Consciousness  . trigeminy    HPI  Shawn Gaines is a 75 y.o. male with a PMHx of CA, who presents to the Emergency Department brought in by EMS, complaining of LOC onset PTA. Pt notes that he was wrapping presents in his bedroom when he got dizzy and fell onto his face. Pt states that he struck his head on the wall during the fall. Pt denies pain at this time, but notes that he feels tired. Pt denies having a pacemaker. He states that he is having associated symptoms of cough, fatigue, and fever. He states that he has not tried any medications for the relief of his symptoms. He denies any other symptoms.    The history is provided by the patient and the EMS personnel. No language interpreter was used.    Past Medical History:  Diagnosis Date  . Arthritis    Knee  . Breathing problem   . Cancer ([place])    skin cancer- R Buttocks-bx was postive - pt not sure what kind of cancer- for removeal in March  . Colon polyps    TWO HYPERPLASTIC POLYPS  . Prostatic hypertrophy   . Rotator cuff disorder    BIL  . Shortness of breath at rest   . Sinus trouble   . Unspecified essential hypertension 05/26/2013    Patient Active Problem List   Diagnosis Date Noted  . Shortness of breath at rest   . Seasonal allergies 08/14/2015  . Chest pain 08/13/2015  . Dermatophytosis of nail 10/05/2014  . Pain in joint, ankle and foot 04/02/2014  . Dyspnea 04/02/2014  . BPH (benign prostatic hyperplasia) 04/02/2014  . Hyponatremia 04/02/2014  . Medication management 12/29/2013  . Fatigue 07/16/2013  . IBS (irritable  bowel syndrome) 07/16/2013  . Prediabetes 07/07/2013  . Essential hypertension 05/26/2013  . Hyperlipidemia 05/26/2013  . Encounter for long-term (current) use of other medications 05/26/2013  . Nocturnal dyspnea 03/19/2013  . HYDROCELE 08/18/2008  . B12 DEFICIENCY 06/27/2007  . Vitamin D deficiency 06/27/2007  . Osteoarthritis 03/25/2007  . Anxiety state 10/07/2006  . BENIGN PROSTATIC HYPERTROPHY, HX OF 10/07/2006    Past Surgical History:  Procedure Laterality Date  . COLONOSCOPY  2014   normal  . JOINT REPLACEMENT  2009   R Knee  . KNEE ARTHROPLASTY  2009   right  . TONSILLECTOMY    . TOTAL KNEE ARTHROPLASTY  05/04/2011   Procedure: TOTAL KNEE ARTHROPLASTY;  Surgeon: Kerin Salen, MD;  Location: [facility];  Service: Orthopedics;  Laterality: Left;  DEPUY       Home Medications    Prior to Admission medications   Medication Sig Start Date End Date Taking? Authorizing Provider  ALPRAZolam (XANAX) 0.25 MG tablet Take 0.25 mg by mouth 3 (three) times daily as needed for anxiety.   Yes Historical Provider, MD  aspirin EC 81 MG tablet Take 81 mg by mouth daily.   Yes Historical Provider, MD  B Complex-C (SUPER B COMPLEX PO) Take 1 tablet by mouth daily.    Yes Historical Provider, MD  benzonatate (TESSALON) 100 MG capsule  Take 100 mg by mouth 3 (three) times daily as needed for cough.   Yes Historical Provider, MD  calcium-vitamin D (OSCAL WITH D) 500-200 MG-UNIT tablet Take 1 tablet by mouth.   Yes Historical Provider, MD  Cholecalciferol (VITAMIN D3) 5000 UNITS TABS Take 1 tablet by mouth daily.    Yes Historical Provider, MD  escitalopram (LEXAPRO) 20 MG tablet Take 20 mg by mouth daily.   Yes Historical Provider, MD  finasteride (PROSCAR) 5 MG tablet Take 5 mg by mouth daily.  02/04/13  Yes Historical Provider, MD  Flax Oil-Fish Oil-Borage Oil (FISH OIL-FLAX OIL-BORAGE OIL PO) Take 1 tablet by mouth.   Yes Historical Provider, MD  fluticasone (FLONASE) 50 MCG/ACT nasal spray  Place 2 sprays into the nose daily. 05/23/12  Yes Laurey Morale, MD  Lactobacillus Acid-Pectin (ACIDOPHILUS PLUS PECTIN) TABS Take 1 tablet by mouth daily.    Yes Historical Provider, MD  lactobacillus acidophilus (BACID) TABS tablet Take 2 tablets by mouth 3 (three) times daily.   Yes Historical Provider, MD  Magnesium 100 MG CAPS Take by mouth.   Yes Historical Provider, MD  Magnesium 250 MG TABS Take 250 mg by mouth daily.    Yes Historical Provider, MD  montelukast (SINGULAIR) 10 MG tablet Take 10 mg by mouth at bedtime.   Yes Historical Provider, MD  Multiple Vitamin (MULTIVITAMIN WITH MINERALS) TABS tablet Take 1 tablet by mouth daily.   Yes Historical Provider, MD  pantoprazole (PROTONIX) 40 MG tablet Take 1 tablet (40 mg total) by mouth daily. Take 1 tablet daily for acid reflux and cough 08/14/15  Yes Ripudeep K Rai, MD  pramipexole (MIRAPEX) 0.125 MG tablet Take 0.125 mg by mouth daily. 11/18/15  Yes Historical Provider, MD  Tamsulosin HCl (FLOMAX) 0.4 MG CAPS Take 0.4 mg by mouth daily. 01/22/12  Yes Laurey Morale, MD    Family History Family History  Problem Relation Age of Onset  . Arthritis Mother   . Anesthesia problems Neg Hx   . Colon cancer Neg Hx     Social History Social History  Substance Use Topics  . Smoking status: Former Smoker    Years: 5.00    Types: Pipe    Quit date: 08/18/1973  . Smokeless tobacco: Never Used     Comment: Cigs in teens--occassional "tried"---Smoked a pipe x 4-5 years.  . Alcohol use 1.2 - 1.8 oz/week    1 - 2 Standard drinks or equivalent, 1 Glasses of wine per week     Allergies   Doxycycline; Doxycycline hyclate; and Lactose intolerance (gi)   Review of Systems Review of Systems A complete 10 system review of systems was obtained and all systems are negative except as noted in the HPI and PMH.   Physical Exam Updated Vital Signs BP 142/80 (BP Location: Right Arm)   Pulse 73   Temp 97.6 F (36.4 C) (Oral)   Resp 20   Ht 5'  9" (1.753 m)   Wt 180 lb (81.6 kg)   SpO2 99%   BMI 26.58 kg/m   Physical Exam  Constitutional: He is oriented to person, place, and time. Vital signs are normal. He appears well-developed and well-nourished.  Non-toxic appearance. He does not appear ill. No distress.  HENT:  Head: Normocephalic and atraumatic.  Nose: Nose normal.  Mouth/Throat: Oropharynx is clear and moist. No oropharyngeal exudate.  Eyes: Conjunctivae and EOM are normal. Pupils are equal, round, and reactive to light. No scleral icterus.  Neck:  Normal range of motion. Neck supple. No tracheal deviation, no edema, no erythema and normal range of motion present. No thyroid mass and no thyromegaly present.  Cardiovascular: Normal rate, regular rhythm, S1 normal, S2 normal, normal heart sounds, intact distal pulses and normal pulses.  Exam reveals no gallop and no friction rub.   No murmur heard. Pulmonary/Chest: Effort normal and breath sounds normal. No respiratory distress. He has no wheezes. He has no rhonchi. He has no rales.  Abdominal: Soft. Normal appearance and bowel sounds are normal. He exhibits no distension, no ascites and no mass. There is no hepatosplenomegaly. There is no tenderness. There is no rebound, no guarding and no CVA tenderness.  Musculoskeletal: Normal range of motion. He exhibits no edema or tenderness.  Lymphadenopathy:    He has no cervical adenopathy.  Neurological: He is alert and oriented to person, place, and time. He has normal strength. No cranial nerve deficit or sensory deficit.  Skin: Skin is warm, dry and intact. No petechiae and no rash noted. He is not diaphoretic. No erythema. No pallor.  Nursing note and vitals reviewed.    ED Treatments / Results  DIAGNOSTIC STUDIES: Oxygen Saturation is 99% on RA, nl by my interpretation.    COORDINATION OF CARE: 11:13 PM Discussed treatment plan with pt at bedside which includes labs, UA, EKG, CT head, CT C-spine, CXR, and pt agreed to  plan.   Labs (all labs ordered are listed, but only abnormal results are displayed) Labs Reviewed  BASIC METABOLIC PANEL - Abnormal; Notable for the following:       Result Value   Sodium 126 (*)    Chloride 96 (*)    CO2 21 (*)    Calcium 8.4 (*)    All other components within normal limits  CBC - Abnormal; Notable for the following:    RBC 4.05 (*)    HCT 37.9 (*)    All other components within normal limits  URINALYSIS, ROUTINE W REFLEX MICROSCOPIC (NOT AT [facility]) - Abnormal; Notable for the following:    Leukocytes, UA TRACE (*)    All other components within normal limits  URINE MICROSCOPIC-ADD ON - Abnormal; Notable for the following:    Squamous Epithelial / LPF 0-5 (*)    Bacteria, UA RARE (*)    Casts HYALINE CASTS (*)    All other components within normal limits  CBG MONITORING, ED - Abnormal; Notable for the following:    Glucose-Capillary 110 (*)    All other components within normal limits  URINE CULTURE  ETHANOL  MAGNESIUM  RAPID URINE DRUG SCREEN, HOSP PERFORMED    EKG  EKG Interpretation None       Radiology Dg Chest 2 View  Result Date: 01/07/2016 CLINICAL DATA:  Syncope today. EXAM: CHEST  2 VIEW COMPARISON:  12/19/2015 FINDINGS: Unchanged marked hyperinflation. Stable linear scarring in the bases. The lungs are otherwise clear. The pulmonary vasculature is normal. There is no pleural effusion. Hilar and mediastinal contours are normal and unchanged. Heart size is normal. IMPRESSION: Hyperinflation.  No acute cardiopulmonary findings. Electronically Signed   By: Andreas Newport M.D.   On: 01/07/2016 23:58   Ct Head Wo Contrast  Result Date: 01/08/2016 CLINICAL DATA:  Acute onset of syncope. Hit back of head on floor. Disorientation, with headache. Question of neck pain. Initial encounter. EXAM: CT HEAD WITHOUT CONTRAST CT CERVICAL SPINE WITHOUT CONTRAST TECHNIQUE: Multidetector CT imaging of the head and cervical spine was performed following the  standard protocol without intravenous contrast. Multiplanar CT image reconstructions of the cervical spine were also generated. COMPARISON:  CT of the head performed 05/24/2012, and CT of the neck performed 11/27/2010 FINDINGS: CT HEAD FINDINGS Brain: No evidence of acute infarction, hemorrhage, hydrocephalus, extra-axial collection or mass lesion/mass effect. Prominence of the ventricles and sulci reflects mild cortical volume loss. Scattered periventricular and subcortical white matter change likely reflects small vessel ischemic microangiopathy. A small chronic lacunar infarct is noted at the left basal ganglia. The brainstem and fourth ventricle are within normal limits. The cerebral hemispheres demonstrate grossly normal gray-white differentiation. No mass effect or midline shift is seen. Vascular: No hyperdense vessel or unexpected calcification. Skull: There is no evidence of fracture; visualized osseous structures are unremarkable in appearance. Sinuses/Orbits: The visualized portions of the orbits are within normal limits. The paranasal sinuses and mastoid air cells are well-aerated. Other: No significant soft tissue abnormalities are seen. CT CERVICAL SPINE FINDINGS Alignment: There is mild grade 1 anterolisthesis of C2 on C3, and grade 1 anterolisthesis of C3 on C4. There is mild grade 1 anterolisthesis of C4 on C5, and grade 1 retrolisthesis of C5 on C6. Skull base and vertebrae: No acute fracture. No primary bone lesion or focal pathologic process. Degenerative change is noted about the dens. Soft tissues and spinal canal: No prevertebral fluid or swelling. No visible canal hematoma. Disc levels: Multilevel disc space narrowing is noted along the cervical spine, with underlying facet disease. Upper chest: Minimal scarring is noted at the right lung apex. The visualized portions of the thyroid gland are unremarkable. Other: No additional soft tissue abnormalities are seen. IMPRESSION: 1. No evidence of  traumatic intracranial injury or fracture. 2. No evidence of fracture or subluxation along the cervical spine. 3. Mild cortical volume loss and scattered small vessel ischemic microangiopathy. 4. Small chronic lacunar infarct at the left basal ganglia. 5. Mild diffuse degenerative change along the cervical spine. 6. Minimal scarring at the right lung apex. Electronically Signed   By: Garald Balding M.D.   On: 01/08/2016 00:40   Ct Cervical Spine Wo Contrast  Result Date: 01/08/2016 CLINICAL DATA:  Acute onset of syncope. Hit back of head on floor. Disorientation, with headache. Question of neck pain. Initial encounter. EXAM: CT HEAD WITHOUT CONTRAST CT CERVICAL SPINE WITHOUT CONTRAST TECHNIQUE: Multidetector CT imaging of the head and cervical spine was performed following the standard protocol without intravenous contrast. Multiplanar CT image reconstructions of the cervical spine were also generated. COMPARISON:  CT of the head performed 05/24/2012, and CT of the neck performed 11/27/2010 FINDINGS: CT HEAD FINDINGS Brain: No evidence of acute infarction, hemorrhage, hydrocephalus, extra-axial collection or mass lesion/mass effect. Prominence of the ventricles and sulci reflects mild cortical volume loss. Scattered periventricular and subcortical white matter change likely reflects small vessel ischemic microangiopathy. A small chronic lacunar infarct is noted at the left basal ganglia. The brainstem and fourth ventricle are within normal limits. The cerebral hemispheres demonstrate grossly normal gray-white differentiation. No mass effect or midline shift is seen. Vascular: No hyperdense vessel or unexpected calcification. Skull: There is no evidence of fracture; visualized osseous structures are unremarkable in appearance. Sinuses/Orbits: The visualized portions of the orbits are within normal limits. The paranasal sinuses and mastoid air cells are well-aerated. Other: No significant soft tissue abnormalities  are seen. CT CERVICAL SPINE FINDINGS Alignment: There is mild grade 1 anterolisthesis of C2 on C3, and grade 1 anterolisthesis of C3 on C4. There is mild grade 1 anterolisthesis  of C4 on C5, and grade 1 retrolisthesis of C5 on C6. Skull base and vertebrae: No acute fracture. No primary bone lesion or focal pathologic process. Degenerative change is noted about the dens. Soft tissues and spinal canal: No prevertebral fluid or swelling. No visible canal hematoma. Disc levels: Multilevel disc space narrowing is noted along the cervical spine, with underlying facet disease. Upper chest: Minimal scarring is noted at the right lung apex. The visualized portions of the thyroid gland are unremarkable. Other: No additional soft tissue abnormalities are seen. IMPRESSION: 1. No evidence of traumatic intracranial injury or fracture. 2. No evidence of fracture or subluxation along the cervical spine. 3. Mild cortical volume loss and scattered small vessel ischemic microangiopathy. 4. Small chronic lacunar infarct at the left basal ganglia. 5. Mild diffuse degenerative change along the cervical spine. 6. Minimal scarring at the right lung apex. Electronically Signed   By: Garald Balding M.D.   On: 01/08/2016 00:40    Procedures Procedures (including critical care time)  Medications Ordered in ED Medications  sodium chloride 0.9 % bolus 1,000 mL (1,000 mLs Intravenous New Bag/Given 01/07/16 2340)     Initial Impression / Assessment and Plan / ED Course  I have reviewed the triage vital signs and the nursing notes.  Pertinent labs & imaging results that were available during my care of the patient were reviewed by me and considered in my medical decision making (see chart for details).  Clinical Course    Patient presents to the ED for syncopal episode.  He does not recall what happened but states there is no a hole in his wall.  Will obtain CT imaging for injury.  He also states he has had viral URI symptoms.   Plan for infectious work up as well.  EKG is pending.  Patient given IVF.   1:32 AM Patient continues to appear well without any further episodes of syncope or dysrrythmia on the monitor.  Labs show low sodium, patient was given a fluid bolus.  CT does not show any injury or cause for syncope.  Plan to DC home with PCP fu.  He appears well and in NAD. VS remain within his normal limits and he is safe for DC. Final Clinical Impressions(s) / ED Diagnoses   Final diagnoses:  None     New Prescriptions New Prescriptions   No medications on file      I personally performed the services described in this documentation, which was scribed in my presence. The recorded information has been reviewed and is accurate.       Everlene Balls, MD 01/08/16 [id number]

## 2016-01-08 DIAGNOSIS — Z96651 Presence of right artificial knee joint: Secondary | ICD-10-CM | POA: Diagnosis not present

## 2016-01-08 DIAGNOSIS — Z79899 Other long term (current) drug therapy: Secondary | ICD-10-CM | POA: Diagnosis not present

## 2016-01-08 DIAGNOSIS — Z85828 Personal history of other malignant neoplasm of skin: Secondary | ICD-10-CM | POA: Diagnosis not present

## 2016-01-08 DIAGNOSIS — R55 Syncope and collapse: Secondary | ICD-10-CM | POA: Diagnosis not present

## 2016-01-08 DIAGNOSIS — Z7982 Long term (current) use of aspirin: Secondary | ICD-10-CM | POA: Diagnosis not present

## 2016-01-08 DIAGNOSIS — Z87891 Personal history of nicotine dependence: Secondary | ICD-10-CM | POA: Diagnosis not present

## 2016-01-08 DIAGNOSIS — S0990XA Unspecified injury of head, initial encounter: Secondary | ICD-10-CM | POA: Diagnosis not present

## 2016-01-08 DIAGNOSIS — I10 Essential (primary) hypertension: Secondary | ICD-10-CM | POA: Diagnosis not present

## 2016-01-08 DIAGNOSIS — W2201XA Walked into wall, initial encounter: Secondary | ICD-10-CM | POA: Diagnosis not present

## 2016-01-08 DIAGNOSIS — R51 Headache: Secondary | ICD-10-CM | POA: Diagnosis not present

## 2016-01-08 LAB — BASIC METABOLIC PANEL
ANION GAP: 9 (ref 5–15)
BUN: 11 mg/dL (ref 6–20)
CHLORIDE: 96 mmol/L — AB (ref 101–111)
CO2: 21 mmol/L — AB (ref 22–32)
CREATININE: 0.78 mg/dL (ref 0.61–1.24)
Calcium: 8.4 mg/dL — ABNORMAL LOW (ref 8.9–10.3)
GFR calc non Af Amer: >60 mL/min (ref >60)
Glucose, Bld: 98 mg/dL (ref 65–99)
POTASSIUM: 3.8 mmol/L (ref 3.5–5.1)
Sodium: 126 mmol/L — ABNORMAL LOW (ref 135–145)

## 2016-01-08 LAB — URINALYSIS, ROUTINE W REFLEX MICROSCOPIC
Bilirubin Urine: NEGATIVE
GLUCOSE, UA: NEGATIVE mg/dL
HGB URINE DIPSTICK: NEGATIVE
Ketones, ur: NEGATIVE mg/dL
Nitrite: NEGATIVE
Protein, ur: NEGATIVE mg/dL
SPECIFIC GRAVITY, URINE: 1.012 (ref 1.005–1.030)
pH: 6.5 (ref 5.0–8.0)

## 2016-01-08 LAB — CBC
HEMATOCRIT: 37.9 % — AB (ref 39.0–52.0)
HEMOGLOBIN: 13.2 g/dL (ref 13.0–17.0)
MCH: 32.6 pg (ref 26.0–34.0)
MCHC: 34.8 g/dL (ref 30.0–36.0)
MCV: 93.6 fL (ref 78.0–100.0)
Platelets: 202 10*3/uL (ref 150–400)
RBC: 4.05 MIL/uL — AB (ref 4.22–5.81)
RDW: 12.4 % (ref 11.5–15.5)
WBC: 5.9 10*3/uL (ref 4.0–10.5)

## 2016-01-08 LAB — URINE MICROSCOPIC-ADD ON: RBC / HPF: NONE SEEN RBC/hpf (ref 0–5)

## 2016-01-08 LAB — RAPID URINE DRUG SCREEN, HOSP PERFORMED
Amphetamines: NOT DETECTED
BENZODIAZEPINES: NOT DETECTED
Barbiturates: NOT DETECTED
COCAINE: NOT DETECTED
OPIATES: NOT DETECTED
Tetrahydrocannabinol: NOT DETECTED

## 2016-01-08 LAB — ETHANOL

## 2016-01-09 ENCOUNTER — Ambulatory Visit (INDEPENDENT_AMBULATORY_CARE_PROVIDER_SITE_OTHER): Payer: Medicare HMO | Admitting: Gastroenterology

## 2016-01-09 ENCOUNTER — Encounter: Payer: Self-pay | Admitting: Gastroenterology

## 2016-01-09 VITALS — BP 138/80 | HR 72 | Ht 69.0 in | Wt 186.8 lb

## 2016-01-09 DIAGNOSIS — K5900 Constipation, unspecified: Secondary | ICD-10-CM | POA: Diagnosis not present

## 2016-01-09 DIAGNOSIS — Z1211 Encounter for screening for malignant neoplasm of colon: Secondary | ICD-10-CM | POA: Insufficient documentation

## 2016-01-09 LAB — URINE CULTURE

## 2016-01-09 MED ORDER — LINACLOTIDE 145 MCG PO CAPS: 145.0000 ug | ORAL_CAPSULE | Freq: Every day | ORAL | 1 refills | Status: AC

## 2016-01-09 MED ORDER — NA SULFATE-K SULFATE-MG SULF 17.5-3.13-1.6 GM/177ML PO SOLN: 1.0000 | Freq: Once | ORAL | 0 refills | Status: AC

## 2016-01-09 NOTE — Progress Notes (Addendum)
     01/09/2016 Rejeana Brock [id number] [birth year]   History of Present Illness:  This is a 75 year old male who was previously known to Dr. Sharlett Iles. Last seen by me in May 2015 at which time Dr. Hilarie Fredrickson was supervising physician. Has been followed and treated for chronic long-standing constipation. His last colonoscopy was in February 2014 at which time the study was grossly normal but he did have a very poor prep. This was never followed up. He presents to our office today to discuss his constipation. Is currently using MiraLAX daily as well as Linzess and eats prunes.  He says that these medications work well for his constipation, but didn't understand why he needs to take something to help him move his bowels. Thinks that it should occur naturally. Is questioning about repeat colonoscopy.  Was in the ER this weekend with complaints of dizziness and a fall at home.  Head imaging ok.  He contributes this to his Lexapro which he has now discontinued. He says that he is going to stop all his other medications for now as well until discussed with his PCP because he does not like the side effects.  Sodium was low at 126 and he was given IV fluids.  Current Medications, Allergies, Past Medical History, Past Surgical History, Family History and Social History were reviewed in [employer] record.   Physical Exam: BP 138/80   Pulse 72   Ht 5\' 9"  (1.753 m)   Wt 186 lb 12.8 oz (84.7 kg)   BMI 27.59 kg/m  General: Well developed white male in no acute distress Head: Normocephalic and atraumatic Eyes:  Sclerae anicteric, conjunctiva pink  Ears: Normal auditory acuity Lungs: Clear throughout to auscultation Heart: Regular rate and rhythm Abdomen: Soft, non-distended.  BS present.  Non-tender. Musculoskeletal: Symmetrical with no gross deformities  Extremities: No edema  Neurological: Alert oriented x 4, grossly non-focal Psychological:  Alert and cooperative. Normal mood and  affect  Assessment and Recommendations: -Chronic long-standing constipation:  Will continue Linzess and Miralax daily, which seem to work well. -Screening colonoscopy:  Last colonoscopy 04/2012 with very poor prep.  Was never followed up.  Will schedule repeat procedure with Dr. Hilarie Fredrickson.  *The risks, benefits, and alternatives to colonoscopy were discussed with the patient and he consents to proceed.   Addendum: Reviewed and agree with management. Jerene Bears, MD

## 2016-01-09 NOTE — Patient Instructions (Addendum)
If you are age 76 or older, your body mass index should be between 23-30. Your Body mass index is 27.59 kg/m. If this is out of the aforementioned range listed, please consider follow up with your Primary Care Provider.  If you are age 79 or younger, your body mass index should be between 19-25. Your Body mass index is 27.59 kg/m. If this is out of the aformentioned range listed, please consider follow up with your Primary Care Provider.   You have been scheduled for a colonoscopy. Please follow written instructions given to you at your visit today.  Please pick up your prep supplies at the pharmacy within the next 1-3 days. If you use inhalers (even only as needed), please bring them with you on the day of your procedure. Your physician has requested that you go to www.startemmi.com and enter the access code given to you at your visit today. This web site gives a general overview about your procedure. However, you should still follow specific instructions given to you by our office regarding your preparation for the procedure.  Continue the Miralax and Linzess every day. Linzess rx sent in.   Thank you for choosing [facility]

## 2016-01-10 ENCOUNTER — Encounter: Payer: Self-pay | Admitting: Pulmonary Disease

## 2016-01-10 ENCOUNTER — Ambulatory Visit (INDEPENDENT_AMBULATORY_CARE_PROVIDER_SITE_OTHER): Payer: Medicare HMO | Admitting: Pulmonary Disease

## 2016-01-10 VITALS — BP 136/88 | HR 74 | Ht 69.0 in | Wt 188.0 lb

## 2016-01-10 DIAGNOSIS — R0602 Shortness of breath: Secondary | ICD-10-CM

## 2016-01-10 DIAGNOSIS — R0600 Dyspnea, unspecified: Secondary | ICD-10-CM

## 2016-01-10 NOTE — Progress Notes (Signed)
Shawn Gaines    [id number]    1940-11-23  Primary Care Physician:HOLWERDA, Nicki Reaper, MD  Referring Physician: Velna Hatchet, MD [address] [place], [place] [address]  Chief complaint:  Follow up for evaluation of cough.  HPI: Shawn Gaines is a 75 year old with past medical history of hypertension, BPH, seasonal allergies. He was seen in the pulmonary office in 2015 for dyspnea. At that time he had a normal chest x-Sidhant and spirometry. He was admitted in June of this year for atypical chest pain with dyspnea. He had 2-D echo that showed EF of 55%, possible distal septal hypokinesis. NM stress test showed EF 56%, no reversible ischemia or infarction, normal LV wall motion. d dimer was normal. He has history of seasonal allergies, postnasal drip. He also has acid reflux and mild esophageal dysmotility.  As per note from Dr. Erik Obey, ENT dated [id number] Pt evaluated for multifactorial dizziness without vertigo or in a year component. He has some dysphagia which is likely reflux related. He'll be sent for a barium swallow and tried on Prilosec. He is physiologic postnasal drip with some seasonal allergies but avoiding antihistamines as it may make postnasal drainage worse.  Interim history: He had a syncope and fall on 01/07/16 after viral URI symptoms. He was evaluated in the ED with a CT head neck which did not show any acute changes. Na was low and he was given IVF. He has been taken off the Xanax, Lexapro by his primary care doctor. C/O dizziness, dry mouth. Dyspnea remains unchanged.  Outpatient Encounter Prescriptions as of 01/10/2016  Medication Sig  . ALPRAZolam (XANAX) 0.25 MG tablet Take 0.25 mg by mouth 3 (three) times daily as needed for anxiety.  Marland Kitchen aspirin EC 81 MG tablet Take 81 mg by mouth daily.  . B Complex-C (SUPER B COMPLEX PO) Take 1 tablet by mouth daily.   . benzonatate (TESSALON) 100 MG capsule Take 100 mg by mouth 3 (three) times daily as needed for cough.  .  calcium-vitamin D (OSCAL WITH D) 500-200 MG-UNIT tablet Take 1 tablet by mouth.  . Cholecalciferol (VITAMIN D3) 5000 UNITS TABS Take 1 tablet by mouth daily.   . finasteride (PROSCAR) 5 MG tablet Take 5 mg by mouth daily.   . Flax Oil-Fish Oil-Borage Oil (FISH OIL-FLAX OIL-BORAGE OIL PO) Take 1 tablet by mouth.  . fluticasone (FLONASE) 50 MCG/ACT nasal spray Place 2 sprays into the nose daily.  . Lactobacillus Acid-Pectin (ACIDOPHILUS PLUS PECTIN) TABS Take 1 tablet by mouth daily.   Marland Kitchen lactobacillus acidophilus (BACID) TABS tablet Take 2 tablets by mouth 3 (three) times daily.  Marland Kitchen linaclotide (LINZESS) 145 MCG CAPS capsule Take 1 capsule (145 mcg total) by mouth daily before breakfast.  . Magnesium 250 MG TABS Take 250 mg by mouth daily.   . montelukast (SINGULAIR) 10 MG tablet Take 10 mg by mouth at bedtime.  . Multiple Vitamin (MULTIVITAMIN WITH MINERALS) TABS tablet Take 1 tablet by mouth daily.  . pantoprazole (PROTONIX) 40 MG tablet Take 1 tablet (40 mg total) by mouth daily. Take 1 tablet daily for acid reflux and cough  . polyethylene glycol (MIRALAX / GLYCOLAX) packet Take 17 g by mouth daily.  . pramipexole (MIRAPEX) 0.125 MG tablet Take 0.125 mg by mouth daily.  . Tamsulosin HCl (FLOMAX) 0.4 MG CAPS Take 0.4 mg by mouth daily.   No facility-administered encounter medications on file as of 01/10/2016.     Allergies as of 01/10/2016 -  Review Complete 01/10/2016  Allergen Reaction Noted  . Doxycycline  08/27/2011  . Doxycycline hyclate Nausea Only 08/13/2015  . Lactose intolerance (gi) Other (See Comments) 05/04/2011  . Lexapro [escitalopram oxalate] Other (See Comments) 01/09/2016    Past Medical History:  Diagnosis Date  . Arthritis    Knee  . Breathing problem   . Cancer ([place])    skin cancer- R Buttocks-bx was postive - pt not sure what kind of cancer- for removeal in March  . Colon polyps    TWO HYPERPLASTIC POLYPS  . Prostatic hypertrophy   . Rotator cuff disorder     BIL  . Shortness of breath at rest   . Sinus trouble   . Unspecified essential hypertension 05/26/2013    Past Surgical History:  Procedure Laterality Date  . COLONOSCOPY  2014   normal  . JOINT REPLACEMENT  2009   R Knee  . KNEE ARTHROPLASTY  2009   right  . TONSILLECTOMY    . TOTAL KNEE ARTHROPLASTY  05/04/2011   Procedure: TOTAL KNEE ARTHROPLASTY;  Surgeon: Kerin Salen, MD;  Location: [facility];  Service: Orthopedics;  Laterality: Left;  DEPUY    Family History  Problem Relation Age of Onset  . Arthritis Mother   . Anesthesia problems Neg Hx   . Colon cancer Neg Hx     Social History   Social History  . Marital status: Divorced    Spouse name: N/A  . Number of children: 1  . Years of education: N/A   Occupational History  . Retired    Social History Main Topics  . Smoking status: Former Smoker    Years: 5.00    Types: Pipe    Quit date: 08/18/1973  . Smokeless tobacco: Never Used     Comment: Cigs in teens--occassional "tried"---Smoked a pipe x 4-5 years.  . Alcohol use 1.2 - 1.8 oz/week    1 - 2 Standard drinks or equivalent, 1 Glasses of wine per week  . Drug use: No  . Sexual activity: Not on file   Other Topics Concern  . 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  HENT: Negative.   Eyes: Negative for blurred vision.  Respiratory: as per HPI  Cardiovascular: Negative for chest pain and palpitations.  Gastrointestinal: Negative for vomiting, diarrhea, blood per rectum. Genitourinary: Negative for dysuria, urgency, frequency and hematuria.  Musculoskeletal: Negative for myalgias, back pain and joint pain.  Skin: Negative for itching and rash.  Neurological: Negative for  dizziness, tremors, focal weakness, seizures and loss of consciousness.  Endo/Heme/Allergies: Negative for environmental allergies.  Psychiatric/Behavioral: Negative for depression, suicidal ideas and hallucinations.  All other systems reviewed and are negative.   Physical Exam: Blood pressure 122/80, pulse 87, height 6' (1.829 m), weight 188 lb (85.3 kg), SpO2 97 %. Gen:      No acute distress HEENT:  EOMI, sclera anicteric Neck:     No masses; no thyromegaly Lungs:    Clear to auscultation bilaterally; normal respiratory effort CV:         Regular rate and rhythm; no murmurs Abd:      + bowel sounds; soft, non-tender; no palpable masses,  no distension Ext:    No edema; adequate peripheral perfusion Skin:      Warm and dry; no rash Neuro: alert and oriented x 3 Psych: normal mood and affect  Data Reviewed: Esophagogram 1. Mild to moderate esophageal dysmotility, likely presbyesophagus. 2. Tiny hiatal hernia. 3. Delayed passage of barium tablet, likely due to dysmotility.  CXR 08/13/15 - Hyperinflation, no acute disease. Images reviewed. CXR 01/07/16- Hyperinflation.  No acute cardiopulmonary findings. Images reviewed.  PFTs 03/19/13 FVC 4.67 [98%] FEV1 3.41 [95%) F/F 73 Normal spirometry  Assessment:  Evaluation for chronic cough. Mr. Tonn had a normal pulmonary eval couple of years ago with a normal chest x-Regino and PFTs. But symptoms of cough persists. This is likely from a combination of GERD, esophageal dysmotility, postnasal drip. He is being followed by ENT for these issues. He had an unremarkable cardiac eval earlier this year.   CXR continue to show hyperinflation. I would like to evaluate again with a full set of PFTs. He has symptoms of sleep apnea and will be scheduled for a sleep study. He will continue on the Flonase and Protonix.  I am concerned that he is increasing forgetful and may not be able to take care of himself. He may need some help at home or consider  staying with his family in [place]. He will discuss this with his PCP at next visit.  Plan/Recommendations: - PFTs. - Sleep study  Marshell Garfinkel MD [facility] Pager [phone] 01/10/2016, 4:15 PM  CC: Velna Hatchet, MD

## 2016-01-10 NOTE — Patient Instructions (Signed)
We will schedule you for a CXR and PFTs.  Return in 3 months.

## 2016-01-11 MED ORDER — FLUTICASONE-SALMETEROL 100-50 MCG/DOSE IN AEPB: 1.0000 | INHALATION_SPRAY | Freq: Two times a day (BID) | RESPIRATORY_TRACT | 5 refills | Status: DC

## 2016-01-11 MED ORDER — FLUTICASONE-SALMETEROL 100-50 MCG/DOSE IN AEPB: 1.0000 | INHALATION_SPRAY | Freq: Two times a day (BID) | RESPIRATORY_TRACT | 5 refills | Status: AC

## 2016-01-11 MED ORDER — FLUTICASONE-SALMETEROL 100-50 MCG/DOSE IN AEPB: 1.0000 | INHALATION_SPRAY | Freq: Two times a day (BID) | RESPIRATORY_TRACT | 0 refills | Status: AC

## 2016-01-12 ENCOUNTER — Emergency Department (HOSPITAL_COMMUNITY)
Admission: EM | Admit: 2016-01-12 | Discharge: 2016-01-12 | Disposition: A | Discharge status: Home / Self Care | Payer: Medicare HMO | Attending: Emergency Medicine | Admitting: Emergency Medicine

## 2016-01-12 ENCOUNTER — Encounter (HOSPITAL_COMMUNITY): Payer: Self-pay

## 2016-01-12 ENCOUNTER — Telehealth: Payer: Self-pay | Admitting: Pulmonary Disease

## 2016-01-12 ENCOUNTER — Ambulatory Visit (HOSPITAL_COMMUNITY): Admission: RE | Admit: 2016-01-12 | Payer: Medicare HMO | Source: Ambulatory Visit

## 2016-01-12 DIAGNOSIS — Z7982 Long term (current) use of aspirin: Secondary | ICD-10-CM | POA: Insufficient documentation

## 2016-01-12 DIAGNOSIS — Z87891 Personal history of nicotine dependence: Secondary | ICD-10-CM | POA: Insufficient documentation

## 2016-01-12 DIAGNOSIS — Z96651 Presence of right artificial knee joint: Secondary | ICD-10-CM | POA: Diagnosis not present

## 2016-01-12 DIAGNOSIS — Z85828 Personal history of other malignant neoplasm of skin: Secondary | ICD-10-CM | POA: Diagnosis not present

## 2016-01-12 DIAGNOSIS — R42 Dizziness and giddiness: Secondary | ICD-10-CM | POA: Diagnosis not present

## 2016-01-12 DIAGNOSIS — I10 Essential (primary) hypertension: Secondary | ICD-10-CM | POA: Diagnosis not present

## 2016-01-12 LAB — PULMONARY FUNCTION TEST
FEF 25-75 PRE: 2.54 L/s
FEF2575-%Pred-Pre: 108 %
FEV1-%PRED-PRE: 112 %
FEV1-Pre: 3.67 L
FEV1FVC-%PRED-PRE: 99 %
FEV6-%Pred-Pre: 115 %
FEV6-PRE: 4.89 L
FEV6FVC-%Pred-Pre: 101 %
FVC-%Pred-Pre: 113 %
FVC-PRE: 5.11 L
PRE FEV1/FVC RATIO: 72 %
Pre FEV6/FVC Ratio: 96 %

## 2016-01-12 LAB — URINALYSIS, ROUTINE W REFLEX MICROSCOPIC
BILIRUBIN URINE: NEGATIVE
Glucose, UA: NEGATIVE mg/dL
HGB URINE DIPSTICK: NEGATIVE
Ketones, ur: NEGATIVE mg/dL
Leukocytes, UA: NEGATIVE
Nitrite: NEGATIVE
PROTEIN: NEGATIVE mg/dL
Specific Gravity, Urine: 1.007 (ref 1.005–1.030)
pH: 7.5 (ref 5.0–8.0)

## 2016-01-12 LAB — CBC
HCT: 38.4 % — ABNORMAL LOW (ref 39.0–52.0)
Hemoglobin: 13.6 g/dL (ref 13.0–17.0)
MCH: 32.4 pg (ref 26.0–34.0)
MCHC: 35.4 g/dL (ref 30.0–36.0)
MCV: 91.4 fL (ref 78.0–100.0)
PLATELETS: 218 10*3/uL (ref 150–400)
RBC: 4.2 MIL/uL — ABNORMAL LOW (ref 4.22–5.81)
RDW: 12.3 % (ref 11.5–15.5)
WBC: 5.5 10*3/uL (ref 4.0–10.5)

## 2016-01-12 LAB — BASIC METABOLIC PANEL
Anion gap: 6 (ref 5–15)
BUN: 12 mg/dL (ref 6–20)
CALCIUM: 9.1 mg/dL (ref 8.9–10.3)
CO2: 24 mmol/L (ref 22–32)
CREATININE: 0.7 mg/dL (ref 0.61–1.24)
Chloride: 102 mmol/L (ref 101–111)
GFR calc Af Amer: >60 mL/min (ref >60)
GFR calc non Af Amer: >60 mL/min (ref >60)
GLUCOSE: 99 mg/dL (ref 65–99)
Potassium: 4.2 mmol/L (ref 3.5–5.1)
Sodium: 132 mmol/L — ABNORMAL LOW (ref 135–145)

## 2016-01-12 LAB — CBG MONITORING, ED: GLUCOSE-CAPILLARY: 82 mg/dL (ref 65–99)

## 2016-01-12 NOTE — ED Notes (Addendum)
Pt ambulates in hallway w/ normal gait w/o c/o dizziness, dyspnea, sob, or cp.

## 2016-01-12 NOTE — Discharge Instructions (Signed)
The results of your blood work, urinalysis, and EKG show no acute cause of your episode of dizziness. You likely had a vagal reaction during the pulmonary function test. Please follow up with your PCP as needed for reevaluation.

## 2016-01-12 NOTE — Progress Notes (Signed)
Pt came in today for an outpatient PFT. Began test with FVC maneuver & after 2 attempts pt became very lightheaded & dizzy. I let him relax for a few minutes to see if it would wear off. After 5 minutes pt felt no better & wanted to get out of PFT box & sit in regular chair. Pt then wanted to try to get up & walk a little bit with my help. He was unable to walk without stumbling, so I helped him back to the chair & placed him on the monitor. BP-171/99, HR-73, SpO2-99%. Rapid response nurse called & she said I had done everything needed & to take him to the ED. Pt taken to the ED with BP still elevated & still feeling dizzy.  Kathie Dike RRT

## 2016-01-12 NOTE — ED Provider Notes (Signed)
[facility] DEPT Provider Note   CSN: [id number] Arrival date & time: 01/12/16  1404     History   Chief Complaint Chief Complaint  Patient presents with  . Hypertension  . Dizziness    HPI Shawn Gaines is a 75 y.o. male.  Patient is 75 yo M with PMH of nonspecific breathing problems, denies history of hypertension or taking antihypertensive medications, presenting to ED after he became dizzy while performing outpatient pulmonary function test just PTA. Patient states he was "blowing hard into a tube" and felt lightheaded, asking to take a walk to relieve his symptoms. Denies any difficulty ambulating, and dizziness completely resolved. Note from respiratory therapist today reports they took his vitals after the episode, with BP 171/99, HR 73, and SpO2 99%. He states he had similar episode of dizziness during previous PFT. Denies any headache, chest pain, palpitations, or shortness of breath. He has no other complaints, and is requesting to be d/c home.      Past Medical History:  Diagnosis Date  . Arthritis    Knee  . Breathing problem   . Cancer ([place])    skin cancer- R Buttocks-bx was postive - pt not sure what kind of cancer- for removeal in March  . Colon polyps    TWO HYPERPLASTIC POLYPS  . Prostatic hypertrophy   . Rotator cuff disorder    BIL  . Shortness of breath at rest   . Sinus trouble   . Unspecified essential hypertension 05/26/2013    Patient Active Problem List   Diagnosis Date Noted  . Constipation 01/09/2016  . Encounter for screening colonoscopy 01/09/2016  . Shortness of breath at rest   . Seasonal allergies 08/14/2015  . Chest pain 08/13/2015  . Dermatophytosis of nail 10/05/2014  . Pain in joint, ankle and foot 04/02/2014  . Dyspnea 04/02/2014  . BPH (benign prostatic hyperplasia) 04/02/2014  . Hyponatremia 04/02/2014  . Medication management 12/29/2013  . Fatigue 07/16/2013  . IBS (irritable bowel syndrome) 07/16/2013  . Prediabetes  07/07/2013  . Essential hypertension 05/26/2013  . Hyperlipidemia 05/26/2013  . Encounter for long-term (current) use of other medications 05/26/2013  . Nocturnal dyspnea 03/19/2013  . HYDROCELE 08/18/2008  . B12 DEFICIENCY 06/27/2007  . Vitamin D deficiency 06/27/2007  . Osteoarthritis 03/25/2007  . Anxiety state 10/07/2006  . BENIGN PROSTATIC HYPERTROPHY, HX OF 10/07/2006    Past Surgical History:  Procedure Laterality Date  . COLONOSCOPY  2014   normal  . JOINT REPLACEMENT  2009   R Knee  . KNEE ARTHROPLASTY  2009   right  . TONSILLECTOMY    . TOTAL KNEE ARTHROPLASTY  05/04/2011   Procedure: TOTAL KNEE ARTHROPLASTY;  Surgeon: Kerin Salen, MD;  Location: [facility];  Service: Orthopedics;  Laterality: Left;  DEPUY       Home Medications    Prior to Admission medications   Medication Sig Start Date End Date Taking? Authorizing Provider  ALPRAZolam (XANAX) 0.25 MG tablet Take 0.25 mg by mouth 3 (three) times daily as needed for anxiety.    Historical Provider, MD  aspirin EC 81 MG tablet Take 81 mg by mouth daily.    Historical Provider, MD  B Complex-C (SUPER B COMPLEX PO) Take 1 tablet by mouth daily.     Historical Provider, MD  benzonatate (TESSALON) 100 MG capsule Take 100 mg by mouth 3 (three) times daily as needed for cough.    Historical Provider, MD  calcium-vitamin D (OSCAL WITH D) 500-200  MG-UNIT tablet Take 1 tablet by mouth.    Historical Provider, MD  Cholecalciferol (VITAMIN D3) 5000 UNITS TABS Take 1 tablet by mouth daily.     Historical Provider, MD  finasteride (PROSCAR) 5 MG tablet Take 5 mg by mouth daily.  02/04/13   Historical Provider, MD  Flax Oil-Fish Oil-Borage Oil (FISH OIL-FLAX OIL-BORAGE OIL PO) Take 1 tablet by mouth.    Historical Provider, MD  fluticasone (FLONASE) 50 MCG/ACT nasal spray Place 2 sprays into the nose daily. 05/23/12   Laurey Morale, MD  Fluticasone-Salmeterol (ADVAIR DISKUS) 100-50 MCG/DOSE AEPB Inhale 1 puff into the lungs 2 (two)  times daily. 01/11/16   Praveen Mannam, MD  Fluticasone-Salmeterol (ADVAIR DISKUS) 100-50 MCG/DOSE AEPB Inhale 1 puff into the lungs 2 (two) times daily. 01/11/16 01/12/16  Praveen Mannam, MD  Lactobacillus Acid-Pectin (ACIDOPHILUS PLUS PECTIN) TABS Take 1 tablet by mouth daily.     Historical Provider, MD  lactobacillus acidophilus (BACID) TABS tablet Take 2 tablets by mouth 3 (three) times daily.    Historical Provider, MD  linaclotide Rolan Lipa) 145 MCG CAPS capsule Take 1 capsule (145 mcg total) by mouth daily before breakfast. 01/09/16   Laban Emperor Zehr, PA-C  Magnesium 250 MG TABS Take 250 mg by mouth daily.     Historical Provider, MD  montelukast (SINGULAIR) 10 MG tablet Take 10 mg by mouth at bedtime.    Historical Provider, MD  Multiple Vitamin (MULTIVITAMIN WITH MINERALS) TABS tablet Take 1 tablet by mouth daily.    Historical Provider, MD  pantoprazole (PROTONIX) 40 MG tablet Take 1 tablet (40 mg total) by mouth daily. Take 1 tablet daily for acid reflux and cough 08/14/15   Ripudeep Krystal Eaton, MD  polyethylene glycol (MIRALAX / GLYCOLAX) packet Take 17 g by mouth daily.    Historical Provider, MD  pramipexole (MIRAPEX) 0.125 MG tablet Take 0.125 mg by mouth daily. 11/18/15   Historical Provider, MD  Tamsulosin HCl (FLOMAX) 0.4 MG CAPS Take 0.4 mg by mouth daily. 01/22/12   Laurey Morale, MD    Family History Family History  Problem Relation Age of Onset  . Arthritis Mother   . Anesthesia problems Neg Hx   . Colon cancer Neg Hx     Social History Social History  Substance Use Topics  . Smoking status: Former Smoker    Years: 5.00    Types: Pipe    Quit date: 08/18/1973  . Smokeless tobacco: Never Used     Comment: Cigs in teens--occassional "tried"---Smoked a pipe x 4-5 years.  . Alcohol use 1.2 - 1.8 oz/week    1 - 2 Standard drinks or equivalent, 1 Glasses of wine per week     Allergies   Doxycycline; Doxycycline hyclate; Lactose intolerance (gi); and Lexapro [escitalopram  oxalate]   Review of Systems Review of Systems  Constitutional: Negative for chills and fever.  HENT: Negative for ear pain and sore throat.   Eyes: Negative for pain and visual disturbance.  Respiratory: Negative for cough and shortness of breath.   Cardiovascular: Negative for chest pain, palpitations and leg swelling.  Gastrointestinal: Negative for abdominal pain, blood in stool, nausea and vomiting.  Genitourinary: Negative for dysuria, flank pain and hematuria.  Musculoskeletal: Negative for back pain and neck pain.  Skin: Negative for color change and rash.  Neurological: Positive for dizziness. Negative for seizures, syncope, weakness, numbness and headaches.     Physical Exam Updated Vital Signs BP 148/85 (BP Location: Left Arm)  Pulse 68   Temp 97.5 F (36.4 C) (Oral)   Resp 16   Ht 6' (1.829 m)   Wt 82.7 kg   SpO2 95%   BMI 24.73 kg/m   Physical Exam  Constitutional: He appears well-developed and well-nourished. No distress.  Pleasant, elderly male, lying comfortably in bed.  HENT:  Head: Normocephalic and atraumatic.  Mouth/Throat: Oropharynx is clear and moist.  Eyes: Conjunctivae are normal.  Neck: Normal range of motion.  Cardiovascular: Normal rate, regular rhythm, normal heart sounds and intact distal pulses.   Pulmonary/Chest: Effort normal and breath sounds normal. No respiratory distress.  Abdominal: Soft. There is no tenderness.  Musculoskeletal: Normal range of motion. He exhibits no edema or tenderness.  Neurological: He is alert.  Skin: Skin is warm and dry.  Psychiatric: He has a normal mood and affect.  Nursing note and vitals reviewed.    ED Treatments / Results  Labs (all labs ordered are listed, but only abnormal results are displayed) Labs Reviewed  BASIC METABOLIC PANEL  CBC  URINALYSIS, ROUTINE W REFLEX MICROSCOPIC (NOT AT [facility])  CBG MONITORING, ED    EKG  EKG Interpretation None       Radiology No results  found.  Procedures Procedures (including critical care time)  Medications Ordered in ED Medications - No data to display   Initial Impression / Assessment and Plan / ED Course  I have reviewed the triage vital signs and the nursing notes.  Pertinent labs & imaging results that were available during my care of the patient were reviewed by me and considered in my medical decision making (see chart for details).  Clinical Course    Patient is 75 yo M presenting to ED after he became dizzy following forceful exhalation during PFT today. He reports brief episode of dizziness and lightheadedness that completely resolved. BP noted to be 171/99, causing staff to send him to ED for evaluation. He denies any history of hypertension. VSS in ED and patient has no other complaints. Patient ambulated without difficulty and orthostatic vitals unremarkable. EKG sinus rhythm with no significant changes from last tracing. CBC, BMP, urinalysis unremarkable. Patient likely experienced vagal reaction while performing PFT. Stable for d/c home with instructions to f/u with PCP or return to ED for new or worsening symptoms.  Final Clinical Impressions(s) / ED Diagnoses   Final diagnoses:  Dizziness    New Prescriptions New Prescriptions   No medications on file     Rosilyn Mings II, [place] 01/12/16 1928    Varney Biles, MD 01/13/16 [phone]

## 2016-01-12 NOTE — Telephone Encounter (Signed)
Will route message to PM to make him aware that pt could not complete PFT.

## 2016-01-12 NOTE — ED Triage Notes (Addendum)
Pt presents with c/o hypertension and dizziness. Pt was in the process of performing a pulmonary function test in the hospital and when he took a deep breath, he became dizzy. Respiratory reports that his BP was elevated when they checked it. Pt denies any dizziness at this present time.

## 2016-01-18 ENCOUNTER — Telehealth: Payer: Self-pay | Admitting: *Deleted

## 2016-01-22 DIAGNOSIS — R269 Unspecified abnormalities of gait and mobility: Secondary | ICD-10-CM | POA: Diagnosis not present

## 2016-01-22 DIAGNOSIS — R42 Dizziness and giddiness: Secondary | ICD-10-CM | POA: Diagnosis not present

## 2016-01-22 DIAGNOSIS — G629 Polyneuropathy, unspecified: Secondary | ICD-10-CM | POA: Diagnosis not present

## 2016-02-15 ENCOUNTER — Encounter (HOSPITAL_BASED_OUTPATIENT_CLINIC_OR_DEPARTMENT_OTHER): Payer: Medicare HMO

## 2016-02-27 DIAGNOSIS — Z9181 History of falling: Secondary | ICD-10-CM | POA: Diagnosis not present

## 2016-02-27 DIAGNOSIS — G9340 Encephalopathy, unspecified: Secondary | ICD-10-CM | POA: Diagnosis not present

## 2016-02-27 DIAGNOSIS — R531 Weakness: Secondary | ICD-10-CM | POA: Diagnosis not present

## 2016-02-27 DIAGNOSIS — I10 Essential (primary) hypertension: Secondary | ICD-10-CM | POA: Diagnosis not present

## 2016-02-27 DIAGNOSIS — I639 Cerebral infarction, unspecified: Secondary | ICD-10-CM | POA: Diagnosis not present

## 2016-02-27 DIAGNOSIS — R918 Other nonspecific abnormal finding of lung field: Secondary | ICD-10-CM | POA: Diagnosis not present

## 2016-02-27 DIAGNOSIS — I638 Other cerebral infarction: Secondary | ICD-10-CM | POA: Diagnosis not present

## 2016-02-27 DIAGNOSIS — R471 Dysarthria and anarthria: Secondary | ICD-10-CM | POA: Diagnosis not present

## 2016-02-27 DIAGNOSIS — R296 Repeated falls: Secondary | ICD-10-CM | POA: Diagnosis not present

## 2016-02-27 DIAGNOSIS — G2581 Restless legs syndrome: Secondary | ICD-10-CM | POA: Diagnosis not present

## 2016-02-27 DIAGNOSIS — I4891 Unspecified atrial fibrillation: Secondary | ICD-10-CM | POA: Diagnosis not present

## 2016-02-27 DIAGNOSIS — R42 Dizziness and giddiness: Secondary | ICD-10-CM | POA: Diagnosis not present

## 2016-02-27 DIAGNOSIS — J383 Other diseases of vocal cords: Secondary | ICD-10-CM | POA: Diagnosis not present

## 2016-02-27 DIAGNOSIS — Z8673 Personal history of transient ischemic attack (TIA), and cerebral infarction without residual deficits: Secondary | ICD-10-CM | POA: Diagnosis not present

## 2016-02-27 DIAGNOSIS — R491 Aphonia: Secondary | ICD-10-CM | POA: Diagnosis not present

## 2016-02-27 DIAGNOSIS — R260 Ataxic gait: Secondary | ICD-10-CM | POA: Diagnosis not present

## 2016-02-27 DIAGNOSIS — R262 Difficulty in walking, not elsewhere classified: Secondary | ICD-10-CM | POA: Diagnosis not present

## 2016-02-27 DIAGNOSIS — I480 Paroxysmal atrial fibrillation: Secondary | ICD-10-CM | POA: Diagnosis not present

## 2016-02-27 DIAGNOSIS — Z7901 Long term (current) use of anticoagulants: Secondary | ICD-10-CM | POA: Diagnosis not present

## 2016-02-27 DIAGNOSIS — R488 Other symbolic dysfunctions: Secondary | ICD-10-CM | POA: Diagnosis not present

## 2016-02-27 DIAGNOSIS — I69398 Other sequelae of cerebral infarction: Secondary | ICD-10-CM | POA: Diagnosis not present

## 2016-02-27 DIAGNOSIS — S0990XA Unspecified injury of head, initial encounter: Secondary | ICD-10-CM | POA: Diagnosis not present

## 2016-02-27 DIAGNOSIS — R69 Illness, unspecified: Secondary | ICD-10-CM | POA: Diagnosis not present

## 2016-02-27 DIAGNOSIS — I69393 Ataxia following cerebral infarction: Secondary | ICD-10-CM | POA: Diagnosis not present

## 2016-02-27 DIAGNOSIS — S79912A Unspecified injury of left hip, initial encounter: Secondary | ICD-10-CM | POA: Diagnosis not present

## 2016-02-27 DIAGNOSIS — M6281 Muscle weakness (generalized): Secondary | ICD-10-CM | POA: Diagnosis not present

## 2016-02-27 DIAGNOSIS — Q281 Other malformations of precerebral vessels: Secondary | ICD-10-CM | POA: Diagnosis not present

## 2016-02-27 DIAGNOSIS — E871 Hypo-osmolality and hyponatremia: Secondary | ICD-10-CM | POA: Diagnosis not present

## 2016-02-27 DIAGNOSIS — N400 Enlarged prostate without lower urinary tract symptoms: Secondary | ICD-10-CM | POA: Diagnosis not present

## 2016-02-27 DIAGNOSIS — R270 Ataxia, unspecified: Secondary | ICD-10-CM | POA: Diagnosis not present

## 2016-02-29 DIAGNOSIS — I639 Cerebral infarction, unspecified: Secondary | ICD-10-CM | POA: Diagnosis not present

## 2016-03-02 DIAGNOSIS — M62830 Muscle spasm of back: Secondary | ICD-10-CM | POA: Diagnosis not present

## 2016-03-02 DIAGNOSIS — I69398 Other sequelae of cerebral infarction: Secondary | ICD-10-CM | POA: Diagnosis not present

## 2016-03-02 DIAGNOSIS — R69 Illness, unspecified: Secondary | ICD-10-CM | POA: Diagnosis not present

## 2016-03-02 DIAGNOSIS — G309 Alzheimer's disease, unspecified: Secondary | ICD-10-CM | POA: Diagnosis not present

## 2016-03-02 DIAGNOSIS — R488 Other symbolic dysfunctions: Secondary | ICD-10-CM | POA: Diagnosis not present

## 2016-03-02 DIAGNOSIS — R296 Repeated falls: Secondary | ICD-10-CM | POA: Diagnosis not present

## 2016-03-02 DIAGNOSIS — R531 Weakness: Secondary | ICD-10-CM | POA: Diagnosis not present

## 2016-03-02 DIAGNOSIS — R930 Abnormal findings on diagnostic imaging of skull and head, not elsewhere classified: Secondary | ICD-10-CM | POA: Diagnosis not present

## 2016-03-02 DIAGNOSIS — M62838 Other muscle spasm: Secondary | ICD-10-CM | POA: Diagnosis not present

## 2016-03-02 DIAGNOSIS — R491 Aphonia: Secondary | ICD-10-CM | POA: Diagnosis not present

## 2016-03-02 DIAGNOSIS — M6281 Muscle weakness (generalized): Secondary | ICD-10-CM | POA: Diagnosis not present

## 2016-03-02 DIAGNOSIS — I4891 Unspecified atrial fibrillation: Secondary | ICD-10-CM | POA: Diagnosis not present

## 2016-03-02 DIAGNOSIS — Z8673 Personal history of transient ischemic attack (TIA), and cerebral infarction without residual deficits: Secondary | ICD-10-CM | POA: Diagnosis not present

## 2016-03-02 DIAGNOSIS — I10 Essential (primary) hypertension: Secondary | ICD-10-CM | POA: Diagnosis not present

## 2016-03-02 DIAGNOSIS — G9340 Encephalopathy, unspecified: Secondary | ICD-10-CM | POA: Diagnosis not present

## 2016-03-02 DIAGNOSIS — M9903 Segmental and somatic dysfunction of lumbar region: Secondary | ICD-10-CM | POA: Diagnosis not present

## 2016-03-02 DIAGNOSIS — Z9181 History of falling: Secondary | ICD-10-CM | POA: Diagnosis not present

## 2016-03-02 DIAGNOSIS — R270 Ataxia, unspecified: Secondary | ICD-10-CM | POA: Diagnosis not present

## 2016-03-02 DIAGNOSIS — I639 Cerebral infarction, unspecified: Secondary | ICD-10-CM | POA: Diagnosis not present

## 2016-03-02 DIAGNOSIS — I6789 Other cerebrovascular disease: Secondary | ICD-10-CM | POA: Diagnosis not present

## 2016-03-02 DIAGNOSIS — G2581 Restless legs syndrome: Secondary | ICD-10-CM | POA: Diagnosis not present

## 2016-03-02 DIAGNOSIS — M5124 Other intervertebral disc displacement, thoracic region: Secondary | ICD-10-CM | POA: Diagnosis not present

## 2016-03-02 DIAGNOSIS — Z7901 Long term (current) use of anticoagulants: Secondary | ICD-10-CM | POA: Diagnosis not present

## 2016-03-02 DIAGNOSIS — R262 Difficulty in walking, not elsewhere classified: Secondary | ICD-10-CM | POA: Diagnosis not present

## 2016-03-02 DIAGNOSIS — R471 Dysarthria and anarthria: Secondary | ICD-10-CM | POA: Diagnosis not present

## 2016-03-02 DIAGNOSIS — H6123 Impacted cerumen, bilateral: Secondary | ICD-10-CM | POA: Diagnosis not present

## 2016-03-02 DIAGNOSIS — M9901 Segmental and somatic dysfunction of cervical region: Secondary | ICD-10-CM | POA: Diagnosis not present

## 2016-03-02 DIAGNOSIS — I69393 Ataxia following cerebral infarction: Secondary | ICD-10-CM | POA: Diagnosis not present

## 2016-03-02 DIAGNOSIS — I480 Paroxysmal atrial fibrillation: Secondary | ICD-10-CM | POA: Diagnosis not present

## 2016-03-14 DIAGNOSIS — M9901 Segmental and somatic dysfunction of cervical region: Secondary | ICD-10-CM | POA: Diagnosis not present

## 2016-03-14 DIAGNOSIS — M62830 Muscle spasm of back: Secondary | ICD-10-CM | POA: Diagnosis not present

## 2016-03-14 DIAGNOSIS — M9903 Segmental and somatic dysfunction of lumbar region: Secondary | ICD-10-CM | POA: Diagnosis not present

## 2016-03-14 DIAGNOSIS — M5124 Other intervertebral disc displacement, thoracic region: Secondary | ICD-10-CM | POA: Diagnosis not present

## 2016-03-14 DIAGNOSIS — M62838 Other muscle spasm: Secondary | ICD-10-CM | POA: Diagnosis not present

## 2016-03-16 DIAGNOSIS — I480 Paroxysmal atrial fibrillation: Secondary | ICD-10-CM | POA: Diagnosis not present

## 2016-03-16 DIAGNOSIS — I10 Essential (primary) hypertension: Secondary | ICD-10-CM | POA: Diagnosis not present

## 2016-03-16 DIAGNOSIS — I639 Cerebral infarction, unspecified: Secondary | ICD-10-CM | POA: Diagnosis not present

## 2016-03-20 ENCOUNTER — Encounter: Payer: Medicare HMO | Admitting: Internal Medicine

## 2016-03-20 DIAGNOSIS — M62830 Muscle spasm of back: Secondary | ICD-10-CM | POA: Diagnosis not present

## 2016-03-20 DIAGNOSIS — M9901 Segmental and somatic dysfunction of cervical region: Secondary | ICD-10-CM | POA: Diagnosis not present

## 2016-03-20 DIAGNOSIS — M9903 Segmental and somatic dysfunction of lumbar region: Secondary | ICD-10-CM | POA: Diagnosis not present

## 2016-03-20 DIAGNOSIS — M62838 Other muscle spasm: Secondary | ICD-10-CM | POA: Diagnosis not present

## 2016-03-20 DIAGNOSIS — M5124 Other intervertebral disc displacement, thoracic region: Secondary | ICD-10-CM | POA: Diagnosis not present

## 2016-03-27 DIAGNOSIS — H6123 Impacted cerumen, bilateral: Secondary | ICD-10-CM | POA: Diagnosis not present

## 2016-03-28 DIAGNOSIS — R69 Illness, unspecified: Secondary | ICD-10-CM | POA: Diagnosis not present

## 2016-03-28 DIAGNOSIS — G9340 Encephalopathy, unspecified: Secondary | ICD-10-CM | POA: Diagnosis not present

## 2016-03-28 DIAGNOSIS — G309 Alzheimer's disease, unspecified: Secondary | ICD-10-CM | POA: Diagnosis not present

## 2016-03-28 DIAGNOSIS — I639 Cerebral infarction, unspecified: Secondary | ICD-10-CM | POA: Diagnosis not present

## 2016-03-28 DIAGNOSIS — I6789 Other cerebrovascular disease: Secondary | ICD-10-CM | POA: Diagnosis not present

## 2016-04-13 DIAGNOSIS — I480 Paroxysmal atrial fibrillation: Secondary | ICD-10-CM | POA: Diagnosis not present

## 2016-04-13 DIAGNOSIS — R491 Aphonia: Secondary | ICD-10-CM | POA: Diagnosis not present

## 2016-04-13 DIAGNOSIS — Z8673 Personal history of transient ischemic attack (TIA), and cerebral infarction without residual deficits: Secondary | ICD-10-CM | POA: Diagnosis not present

## 2016-04-13 DIAGNOSIS — I10 Essential (primary) hypertension: Secondary | ICD-10-CM | POA: Diagnosis not present

## 2016-04-13 DIAGNOSIS — R270 Ataxia, unspecified: Secondary | ICD-10-CM | POA: Diagnosis not present

## 2016-04-16 DIAGNOSIS — R491 Aphonia: Secondary | ICD-10-CM | POA: Diagnosis not present

## 2016-04-16 DIAGNOSIS — I480 Paroxysmal atrial fibrillation: Secondary | ICD-10-CM | POA: Diagnosis not present

## 2016-04-16 DIAGNOSIS — R270 Ataxia, unspecified: Secondary | ICD-10-CM | POA: Diagnosis not present

## 2016-04-16 DIAGNOSIS — I10 Essential (primary) hypertension: Secondary | ICD-10-CM | POA: Diagnosis not present

## 2016-04-16 DIAGNOSIS — Z8673 Personal history of transient ischemic attack (TIA), and cerebral infarction without residual deficits: Secondary | ICD-10-CM | POA: Diagnosis not present

## 2016-04-17 DIAGNOSIS — R930 Abnormal findings on diagnostic imaging of skull and head, not elsewhere classified: Secondary | ICD-10-CM | POA: Diagnosis not present

## 2016-04-17 DIAGNOSIS — I639 Cerebral infarction, unspecified: Secondary | ICD-10-CM | POA: Diagnosis not present

## 2016-04-22 DIAGNOSIS — R69 Illness, unspecified: Secondary | ICD-10-CM | POA: Diagnosis not present

## 2016-04-22 DIAGNOSIS — I69393 Ataxia following cerebral infarction: Secondary | ICD-10-CM | POA: Diagnosis not present

## 2016-04-22 DIAGNOSIS — I10 Essential (primary) hypertension: Secondary | ICD-10-CM | POA: Diagnosis not present

## 2016-04-22 DIAGNOSIS — M25561 Pain in right knee: Secondary | ICD-10-CM | POA: Diagnosis not present

## 2016-04-22 DIAGNOSIS — I480 Paroxysmal atrial fibrillation: Secondary | ICD-10-CM | POA: Diagnosis not present

## 2016-04-22 DIAGNOSIS — Z7982 Long term (current) use of aspirin: Secondary | ICD-10-CM | POA: Diagnosis not present

## 2016-04-22 DIAGNOSIS — I69322 Dysarthria following cerebral infarction: Secondary | ICD-10-CM | POA: Diagnosis not present

## 2016-04-22 DIAGNOSIS — I69354 Hemiplegia and hemiparesis following cerebral infarction affecting left non-dominant side: Secondary | ICD-10-CM | POA: Diagnosis not present

## 2016-04-22 DIAGNOSIS — N400 Enlarged prostate without lower urinary tract symptoms: Secondary | ICD-10-CM | POA: Diagnosis not present

## 2016-04-22 DIAGNOSIS — G2581 Restless legs syndrome: Secondary | ICD-10-CM | POA: Diagnosis not present

## 2016-04-22 DIAGNOSIS — N39498 Other specified urinary incontinence: Secondary | ICD-10-CM | POA: Diagnosis not present

## 2016-04-22 DIAGNOSIS — Z9181 History of falling: Secondary | ICD-10-CM | POA: Diagnosis not present

## 2016-04-22 DIAGNOSIS — N401 Enlarged prostate with lower urinary tract symptoms: Secondary | ICD-10-CM | POA: Diagnosis not present

## 2016-04-23 DIAGNOSIS — G2581 Restless legs syndrome: Secondary | ICD-10-CM | POA: Diagnosis not present

## 2016-04-23 DIAGNOSIS — I69322 Dysarthria following cerebral infarction: Secondary | ICD-10-CM | POA: Diagnosis not present

## 2016-04-23 DIAGNOSIS — R69 Illness, unspecified: Secondary | ICD-10-CM | POA: Diagnosis not present

## 2016-04-23 DIAGNOSIS — I69354 Hemiplegia and hemiparesis following cerebral infarction affecting left non-dominant side: Secondary | ICD-10-CM | POA: Diagnosis not present

## 2016-04-23 DIAGNOSIS — Z7982 Long term (current) use of aspirin: Secondary | ICD-10-CM | POA: Diagnosis not present

## 2016-04-23 DIAGNOSIS — I480 Paroxysmal atrial fibrillation: Secondary | ICD-10-CM | POA: Diagnosis not present

## 2016-04-23 DIAGNOSIS — I69393 Ataxia following cerebral infarction: Secondary | ICD-10-CM | POA: Diagnosis not present

## 2016-04-23 DIAGNOSIS — Z9181 History of falling: Secondary | ICD-10-CM | POA: Diagnosis not present

## 2016-04-23 DIAGNOSIS — I10 Essential (primary) hypertension: Secondary | ICD-10-CM | POA: Diagnosis not present

## 2016-04-23 DIAGNOSIS — N400 Enlarged prostate without lower urinary tract symptoms: Secondary | ICD-10-CM | POA: Diagnosis not present

## 2016-04-25 DIAGNOSIS — I69393 Ataxia following cerebral infarction: Secondary | ICD-10-CM | POA: Diagnosis not present

## 2016-04-25 DIAGNOSIS — G2581 Restless legs syndrome: Secondary | ICD-10-CM | POA: Diagnosis not present

## 2016-04-25 DIAGNOSIS — R69 Illness, unspecified: Secondary | ICD-10-CM | POA: Diagnosis not present

## 2016-04-25 DIAGNOSIS — Z7982 Long term (current) use of aspirin: Secondary | ICD-10-CM | POA: Diagnosis not present

## 2016-04-25 DIAGNOSIS — I69354 Hemiplegia and hemiparesis following cerebral infarction affecting left non-dominant side: Secondary | ICD-10-CM | POA: Diagnosis not present

## 2016-04-25 DIAGNOSIS — I10 Essential (primary) hypertension: Secondary | ICD-10-CM | POA: Diagnosis not present

## 2016-04-25 DIAGNOSIS — I69322 Dysarthria following cerebral infarction: Secondary | ICD-10-CM | POA: Diagnosis not present

## 2016-04-25 DIAGNOSIS — Z9181 History of falling: Secondary | ICD-10-CM | POA: Diagnosis not present

## 2016-04-25 DIAGNOSIS — I480 Paroxysmal atrial fibrillation: Secondary | ICD-10-CM | POA: Diagnosis not present

## 2016-04-25 DIAGNOSIS — N400 Enlarged prostate without lower urinary tract symptoms: Secondary | ICD-10-CM | POA: Diagnosis not present

## 2016-04-26 DIAGNOSIS — I69322 Dysarthria following cerebral infarction: Secondary | ICD-10-CM | POA: Diagnosis not present

## 2016-04-26 DIAGNOSIS — I480 Paroxysmal atrial fibrillation: Secondary | ICD-10-CM | POA: Diagnosis not present

## 2016-04-26 DIAGNOSIS — Z9181 History of falling: Secondary | ICD-10-CM | POA: Diagnosis not present

## 2016-04-26 DIAGNOSIS — I69393 Ataxia following cerebral infarction: Secondary | ICD-10-CM | POA: Diagnosis not present

## 2016-04-26 DIAGNOSIS — I10 Essential (primary) hypertension: Secondary | ICD-10-CM | POA: Diagnosis not present

## 2016-04-26 DIAGNOSIS — N400 Enlarged prostate without lower urinary tract symptoms: Secondary | ICD-10-CM | POA: Diagnosis not present

## 2016-04-26 DIAGNOSIS — R69 Illness, unspecified: Secondary | ICD-10-CM | POA: Diagnosis not present

## 2016-04-26 DIAGNOSIS — G2581 Restless legs syndrome: Secondary | ICD-10-CM | POA: Diagnosis not present

## 2016-04-26 DIAGNOSIS — I69354 Hemiplegia and hemiparesis following cerebral infarction affecting left non-dominant side: Secondary | ICD-10-CM | POA: Diagnosis not present

## 2016-04-26 DIAGNOSIS — Z7982 Long term (current) use of aspirin: Secondary | ICD-10-CM | POA: Diagnosis not present

## 2016-04-27 DIAGNOSIS — Z7982 Long term (current) use of aspirin: Secondary | ICD-10-CM | POA: Diagnosis not present

## 2016-04-27 DIAGNOSIS — I480 Paroxysmal atrial fibrillation: Secondary | ICD-10-CM | POA: Diagnosis not present

## 2016-04-27 DIAGNOSIS — I69354 Hemiplegia and hemiparesis following cerebral infarction affecting left non-dominant side: Secondary | ICD-10-CM | POA: Diagnosis not present

## 2016-04-27 DIAGNOSIS — R413 Other amnesia: Secondary | ICD-10-CM | POA: Diagnosis not present

## 2016-04-27 DIAGNOSIS — Z9181 History of falling: Secondary | ICD-10-CM | POA: Diagnosis not present

## 2016-04-27 DIAGNOSIS — N400 Enlarged prostate without lower urinary tract symptoms: Secondary | ICD-10-CM | POA: Diagnosis not present

## 2016-04-27 DIAGNOSIS — R270 Ataxia, unspecified: Secondary | ICD-10-CM | POA: Diagnosis not present

## 2016-04-27 DIAGNOSIS — R69 Illness, unspecified: Secondary | ICD-10-CM | POA: Diagnosis not present

## 2016-04-27 DIAGNOSIS — I69393 Ataxia following cerebral infarction: Secondary | ICD-10-CM | POA: Diagnosis not present

## 2016-04-27 DIAGNOSIS — I639 Cerebral infarction, unspecified: Secondary | ICD-10-CM | POA: Diagnosis not present

## 2016-04-27 DIAGNOSIS — G2581 Restless legs syndrome: Secondary | ICD-10-CM | POA: Diagnosis not present

## 2016-04-27 DIAGNOSIS — I10 Essential (primary) hypertension: Secondary | ICD-10-CM | POA: Diagnosis not present

## 2016-04-27 DIAGNOSIS — R471 Dysarthria and anarthria: Secondary | ICD-10-CM | POA: Diagnosis not present

## 2016-04-27 DIAGNOSIS — I69322 Dysarthria following cerebral infarction: Secondary | ICD-10-CM | POA: Diagnosis not present

## 2016-04-28 DIAGNOSIS — I69354 Hemiplegia and hemiparesis following cerebral infarction affecting left non-dominant side: Secondary | ICD-10-CM | POA: Diagnosis not present

## 2016-04-28 DIAGNOSIS — I10 Essential (primary) hypertension: Secondary | ICD-10-CM | POA: Diagnosis not present

## 2016-04-28 DIAGNOSIS — Z7982 Long term (current) use of aspirin: Secondary | ICD-10-CM | POA: Diagnosis not present

## 2016-04-28 DIAGNOSIS — G2581 Restless legs syndrome: Secondary | ICD-10-CM | POA: Diagnosis not present

## 2016-04-28 DIAGNOSIS — I69393 Ataxia following cerebral infarction: Secondary | ICD-10-CM | POA: Diagnosis not present

## 2016-04-28 DIAGNOSIS — R69 Illness, unspecified: Secondary | ICD-10-CM | POA: Diagnosis not present

## 2016-04-28 DIAGNOSIS — N400 Enlarged prostate without lower urinary tract symptoms: Secondary | ICD-10-CM | POA: Diagnosis not present

## 2016-04-28 DIAGNOSIS — Z9181 History of falling: Secondary | ICD-10-CM | POA: Diagnosis not present

## 2016-04-28 DIAGNOSIS — I480 Paroxysmal atrial fibrillation: Secondary | ICD-10-CM | POA: Diagnosis not present

## 2016-04-28 DIAGNOSIS — I69322 Dysarthria following cerebral infarction: Secondary | ICD-10-CM | POA: Diagnosis not present

## 2016-04-30 DIAGNOSIS — I480 Paroxysmal atrial fibrillation: Secondary | ICD-10-CM | POA: Diagnosis not present

## 2016-04-30 DIAGNOSIS — R69 Illness, unspecified: Secondary | ICD-10-CM | POA: Diagnosis not present

## 2016-04-30 DIAGNOSIS — I69354 Hemiplegia and hemiparesis following cerebral infarction affecting left non-dominant side: Secondary | ICD-10-CM | POA: Diagnosis not present

## 2016-04-30 DIAGNOSIS — I10 Essential (primary) hypertension: Secondary | ICD-10-CM | POA: Diagnosis not present

## 2016-04-30 DIAGNOSIS — N400 Enlarged prostate without lower urinary tract symptoms: Secondary | ICD-10-CM | POA: Diagnosis not present

## 2016-04-30 DIAGNOSIS — G2581 Restless legs syndrome: Secondary | ICD-10-CM | POA: Diagnosis not present

## 2016-04-30 DIAGNOSIS — Z7982 Long term (current) use of aspirin: Secondary | ICD-10-CM | POA: Diagnosis not present

## 2016-04-30 DIAGNOSIS — I69322 Dysarthria following cerebral infarction: Secondary | ICD-10-CM | POA: Diagnosis not present

## 2016-04-30 DIAGNOSIS — Z9181 History of falling: Secondary | ICD-10-CM | POA: Diagnosis not present

## 2016-04-30 DIAGNOSIS — I69393 Ataxia following cerebral infarction: Secondary | ICD-10-CM | POA: Diagnosis not present

## 2016-05-01 DIAGNOSIS — R69 Illness, unspecified: Secondary | ICD-10-CM | POA: Diagnosis not present

## 2016-05-01 DIAGNOSIS — I480 Paroxysmal atrial fibrillation: Secondary | ICD-10-CM | POA: Diagnosis not present

## 2016-05-01 DIAGNOSIS — G2581 Restless legs syndrome: Secondary | ICD-10-CM | POA: Diagnosis not present

## 2016-05-01 DIAGNOSIS — N400 Enlarged prostate without lower urinary tract symptoms: Secondary | ICD-10-CM | POA: Diagnosis not present

## 2016-05-01 DIAGNOSIS — Z9181 History of falling: Secondary | ICD-10-CM | POA: Diagnosis not present

## 2016-05-01 DIAGNOSIS — I10 Essential (primary) hypertension: Secondary | ICD-10-CM | POA: Diagnosis not present

## 2016-05-01 DIAGNOSIS — I69322 Dysarthria following cerebral infarction: Secondary | ICD-10-CM | POA: Diagnosis not present

## 2016-05-01 DIAGNOSIS — Z7982 Long term (current) use of aspirin: Secondary | ICD-10-CM | POA: Diagnosis not present

## 2016-05-01 DIAGNOSIS — I69393 Ataxia following cerebral infarction: Secondary | ICD-10-CM | POA: Diagnosis not present

## 2016-05-01 DIAGNOSIS — I69354 Hemiplegia and hemiparesis following cerebral infarction affecting left non-dominant side: Secondary | ICD-10-CM | POA: Diagnosis not present

## 2016-05-02 DIAGNOSIS — G2581 Restless legs syndrome: Secondary | ICD-10-CM | POA: Diagnosis not present

## 2016-05-02 DIAGNOSIS — R69 Illness, unspecified: Secondary | ICD-10-CM | POA: Diagnosis not present

## 2016-05-02 DIAGNOSIS — I69354 Hemiplegia and hemiparesis following cerebral infarction affecting left non-dominant side: Secondary | ICD-10-CM | POA: Diagnosis not present

## 2016-05-02 DIAGNOSIS — N400 Enlarged prostate without lower urinary tract symptoms: Secondary | ICD-10-CM | POA: Diagnosis not present

## 2016-05-02 DIAGNOSIS — Z7982 Long term (current) use of aspirin: Secondary | ICD-10-CM | POA: Diagnosis not present

## 2016-05-02 DIAGNOSIS — Z9181 History of falling: Secondary | ICD-10-CM | POA: Diagnosis not present

## 2016-05-02 DIAGNOSIS — I480 Paroxysmal atrial fibrillation: Secondary | ICD-10-CM | POA: Diagnosis not present

## 2016-05-02 DIAGNOSIS — I10 Essential (primary) hypertension: Secondary | ICD-10-CM | POA: Diagnosis not present

## 2016-05-02 DIAGNOSIS — I69393 Ataxia following cerebral infarction: Secondary | ICD-10-CM | POA: Diagnosis not present

## 2016-05-02 DIAGNOSIS — I69322 Dysarthria following cerebral infarction: Secondary | ICD-10-CM | POA: Diagnosis not present

## 2016-05-04 DIAGNOSIS — I10 Essential (primary) hypertension: Secondary | ICD-10-CM | POA: Diagnosis not present

## 2016-05-04 DIAGNOSIS — I69393 Ataxia following cerebral infarction: Secondary | ICD-10-CM | POA: Diagnosis not present

## 2016-05-04 DIAGNOSIS — N400 Enlarged prostate without lower urinary tract symptoms: Secondary | ICD-10-CM | POA: Diagnosis not present

## 2016-05-04 DIAGNOSIS — Z7982 Long term (current) use of aspirin: Secondary | ICD-10-CM | POA: Diagnosis not present

## 2016-05-04 DIAGNOSIS — Z9181 History of falling: Secondary | ICD-10-CM | POA: Diagnosis not present

## 2016-05-04 DIAGNOSIS — I480 Paroxysmal atrial fibrillation: Secondary | ICD-10-CM | POA: Diagnosis not present

## 2016-05-04 DIAGNOSIS — G2581 Restless legs syndrome: Secondary | ICD-10-CM | POA: Diagnosis not present

## 2016-05-04 DIAGNOSIS — R69 Illness, unspecified: Secondary | ICD-10-CM | POA: Diagnosis not present

## 2016-05-04 DIAGNOSIS — I69354 Hemiplegia and hemiparesis following cerebral infarction affecting left non-dominant side: Secondary | ICD-10-CM | POA: Diagnosis not present

## 2016-05-04 DIAGNOSIS — I69322 Dysarthria following cerebral infarction: Secondary | ICD-10-CM | POA: Diagnosis not present

## 2016-05-07 DIAGNOSIS — I69322 Dysarthria following cerebral infarction: Secondary | ICD-10-CM | POA: Diagnosis not present

## 2016-05-07 DIAGNOSIS — Z9181 History of falling: Secondary | ICD-10-CM | POA: Diagnosis not present

## 2016-05-07 DIAGNOSIS — I69354 Hemiplegia and hemiparesis following cerebral infarction affecting left non-dominant side: Secondary | ICD-10-CM | POA: Diagnosis not present

## 2016-05-07 DIAGNOSIS — R69 Illness, unspecified: Secondary | ICD-10-CM | POA: Diagnosis not present

## 2016-05-07 DIAGNOSIS — G2581 Restless legs syndrome: Secondary | ICD-10-CM | POA: Diagnosis not present

## 2016-05-07 DIAGNOSIS — I69393 Ataxia following cerebral infarction: Secondary | ICD-10-CM | POA: Diagnosis not present

## 2016-05-07 DIAGNOSIS — Z7982 Long term (current) use of aspirin: Secondary | ICD-10-CM | POA: Diagnosis not present

## 2016-05-07 DIAGNOSIS — I480 Paroxysmal atrial fibrillation: Secondary | ICD-10-CM | POA: Diagnosis not present

## 2016-05-07 DIAGNOSIS — N400 Enlarged prostate without lower urinary tract symptoms: Secondary | ICD-10-CM | POA: Diagnosis not present

## 2016-05-07 DIAGNOSIS — I10 Essential (primary) hypertension: Secondary | ICD-10-CM | POA: Diagnosis not present

## 2016-05-08 DIAGNOSIS — I480 Paroxysmal atrial fibrillation: Secondary | ICD-10-CM | POA: Diagnosis not present

## 2016-05-08 DIAGNOSIS — Z9181 History of falling: Secondary | ICD-10-CM | POA: Diagnosis not present

## 2016-05-08 DIAGNOSIS — Z7982 Long term (current) use of aspirin: Secondary | ICD-10-CM | POA: Diagnosis not present

## 2016-05-08 DIAGNOSIS — G2581 Restless legs syndrome: Secondary | ICD-10-CM | POA: Diagnosis not present

## 2016-05-08 DIAGNOSIS — I69322 Dysarthria following cerebral infarction: Secondary | ICD-10-CM | POA: Diagnosis not present

## 2016-05-08 DIAGNOSIS — I69393 Ataxia following cerebral infarction: Secondary | ICD-10-CM | POA: Diagnosis not present

## 2016-05-08 DIAGNOSIS — I69354 Hemiplegia and hemiparesis following cerebral infarction affecting left non-dominant side: Secondary | ICD-10-CM | POA: Diagnosis not present

## 2016-05-08 DIAGNOSIS — I10 Essential (primary) hypertension: Secondary | ICD-10-CM | POA: Diagnosis not present

## 2016-05-08 DIAGNOSIS — N400 Enlarged prostate without lower urinary tract symptoms: Secondary | ICD-10-CM | POA: Diagnosis not present

## 2016-05-08 DIAGNOSIS — R69 Illness, unspecified: Secondary | ICD-10-CM | POA: Diagnosis not present

## 2016-05-09 DIAGNOSIS — I69354 Hemiplegia and hemiparesis following cerebral infarction affecting left non-dominant side: Secondary | ICD-10-CM | POA: Diagnosis not present

## 2016-05-09 DIAGNOSIS — I10 Essential (primary) hypertension: Secondary | ICD-10-CM | POA: Diagnosis not present

## 2016-05-09 DIAGNOSIS — I69322 Dysarthria following cerebral infarction: Secondary | ICD-10-CM | POA: Diagnosis not present

## 2016-05-09 DIAGNOSIS — I480 Paroxysmal atrial fibrillation: Secondary | ICD-10-CM | POA: Diagnosis not present

## 2016-05-09 DIAGNOSIS — R69 Illness, unspecified: Secondary | ICD-10-CM | POA: Diagnosis not present

## 2016-05-09 DIAGNOSIS — I69393 Ataxia following cerebral infarction: Secondary | ICD-10-CM | POA: Diagnosis not present

## 2016-05-09 DIAGNOSIS — Z7982 Long term (current) use of aspirin: Secondary | ICD-10-CM | POA: Diagnosis not present

## 2016-05-09 DIAGNOSIS — Z9181 History of falling: Secondary | ICD-10-CM | POA: Diagnosis not present

## 2016-05-09 DIAGNOSIS — G2581 Restless legs syndrome: Secondary | ICD-10-CM | POA: Diagnosis not present

## 2016-05-09 DIAGNOSIS — N400 Enlarged prostate without lower urinary tract symptoms: Secondary | ICD-10-CM | POA: Diagnosis not present

## 2016-05-11 DIAGNOSIS — R69 Illness, unspecified: Secondary | ICD-10-CM | POA: Diagnosis not present

## 2016-05-11 DIAGNOSIS — N400 Enlarged prostate without lower urinary tract symptoms: Secondary | ICD-10-CM | POA: Diagnosis not present

## 2016-05-11 DIAGNOSIS — Z9181 History of falling: Secondary | ICD-10-CM | POA: Diagnosis not present

## 2016-05-11 DIAGNOSIS — I480 Paroxysmal atrial fibrillation: Secondary | ICD-10-CM | POA: Diagnosis not present

## 2016-05-11 DIAGNOSIS — Z7982 Long term (current) use of aspirin: Secondary | ICD-10-CM | POA: Diagnosis not present

## 2016-05-11 DIAGNOSIS — G2581 Restless legs syndrome: Secondary | ICD-10-CM | POA: Diagnosis not present

## 2016-05-11 DIAGNOSIS — I69354 Hemiplegia and hemiparesis following cerebral infarction affecting left non-dominant side: Secondary | ICD-10-CM | POA: Diagnosis not present

## 2016-05-11 DIAGNOSIS — I69393 Ataxia following cerebral infarction: Secondary | ICD-10-CM | POA: Diagnosis not present

## 2016-05-11 DIAGNOSIS — I10 Essential (primary) hypertension: Secondary | ICD-10-CM | POA: Diagnosis not present

## 2016-05-11 DIAGNOSIS — I69322 Dysarthria following cerebral infarction: Secondary | ICD-10-CM | POA: Diagnosis not present

## 2016-05-14 DIAGNOSIS — Z7982 Long term (current) use of aspirin: Secondary | ICD-10-CM | POA: Diagnosis not present

## 2016-05-14 DIAGNOSIS — I480 Paroxysmal atrial fibrillation: Secondary | ICD-10-CM | POA: Diagnosis not present

## 2016-05-14 DIAGNOSIS — I69393 Ataxia following cerebral infarction: Secondary | ICD-10-CM | POA: Diagnosis not present

## 2016-05-14 DIAGNOSIS — N400 Enlarged prostate without lower urinary tract symptoms: Secondary | ICD-10-CM | POA: Diagnosis not present

## 2016-05-14 DIAGNOSIS — I10 Essential (primary) hypertension: Secondary | ICD-10-CM | POA: Diagnosis not present

## 2016-05-14 DIAGNOSIS — R69 Illness, unspecified: Secondary | ICD-10-CM | POA: Diagnosis not present

## 2016-05-14 DIAGNOSIS — Z9181 History of falling: Secondary | ICD-10-CM | POA: Diagnosis not present

## 2016-05-14 DIAGNOSIS — G2581 Restless legs syndrome: Secondary | ICD-10-CM | POA: Diagnosis not present

## 2016-05-14 DIAGNOSIS — I69354 Hemiplegia and hemiparesis following cerebral infarction affecting left non-dominant side: Secondary | ICD-10-CM | POA: Diagnosis not present

## 2016-05-14 DIAGNOSIS — I69322 Dysarthria following cerebral infarction: Secondary | ICD-10-CM | POA: Diagnosis not present

## 2016-05-15 DIAGNOSIS — I69322 Dysarthria following cerebral infarction: Secondary | ICD-10-CM | POA: Diagnosis not present

## 2016-05-15 DIAGNOSIS — Z7982 Long term (current) use of aspirin: Secondary | ICD-10-CM | POA: Diagnosis not present

## 2016-05-15 DIAGNOSIS — N400 Enlarged prostate without lower urinary tract symptoms: Secondary | ICD-10-CM | POA: Diagnosis not present

## 2016-05-15 DIAGNOSIS — I480 Paroxysmal atrial fibrillation: Secondary | ICD-10-CM | POA: Diagnosis not present

## 2016-05-15 DIAGNOSIS — G2581 Restless legs syndrome: Secondary | ICD-10-CM | POA: Diagnosis not present

## 2016-05-15 DIAGNOSIS — I10 Essential (primary) hypertension: Secondary | ICD-10-CM | POA: Diagnosis not present

## 2016-05-15 DIAGNOSIS — Z9181 History of falling: Secondary | ICD-10-CM | POA: Diagnosis not present

## 2016-05-15 DIAGNOSIS — R69 Illness, unspecified: Secondary | ICD-10-CM | POA: Diagnosis not present

## 2016-05-15 DIAGNOSIS — I69354 Hemiplegia and hemiparesis following cerebral infarction affecting left non-dominant side: Secondary | ICD-10-CM | POA: Diagnosis not present

## 2016-05-15 DIAGNOSIS — I69393 Ataxia following cerebral infarction: Secondary | ICD-10-CM | POA: Diagnosis not present

## 2016-05-16 DIAGNOSIS — Z7982 Long term (current) use of aspirin: Secondary | ICD-10-CM | POA: Diagnosis not present

## 2016-05-16 DIAGNOSIS — I69354 Hemiplegia and hemiparesis following cerebral infarction affecting left non-dominant side: Secondary | ICD-10-CM | POA: Diagnosis not present

## 2016-05-16 DIAGNOSIS — I69393 Ataxia following cerebral infarction: Secondary | ICD-10-CM | POA: Diagnosis not present

## 2016-05-16 DIAGNOSIS — I10 Essential (primary) hypertension: Secondary | ICD-10-CM | POA: Diagnosis not present

## 2016-05-16 DIAGNOSIS — Z9181 History of falling: Secondary | ICD-10-CM | POA: Diagnosis not present

## 2016-05-16 DIAGNOSIS — G2581 Restless legs syndrome: Secondary | ICD-10-CM | POA: Diagnosis not present

## 2016-05-16 DIAGNOSIS — N400 Enlarged prostate without lower urinary tract symptoms: Secondary | ICD-10-CM | POA: Diagnosis not present

## 2016-05-16 DIAGNOSIS — R69 Illness, unspecified: Secondary | ICD-10-CM | POA: Diagnosis not present

## 2016-05-16 DIAGNOSIS — I69322 Dysarthria following cerebral infarction: Secondary | ICD-10-CM | POA: Diagnosis not present

## 2016-05-16 DIAGNOSIS — I480 Paroxysmal atrial fibrillation: Secondary | ICD-10-CM | POA: Diagnosis not present

## 2016-05-17 DIAGNOSIS — G2581 Restless legs syndrome: Secondary | ICD-10-CM | POA: Diagnosis not present

## 2016-05-17 DIAGNOSIS — R110 Nausea: Secondary | ICD-10-CM | POA: Diagnosis not present

## 2016-05-17 DIAGNOSIS — I639 Cerebral infarction, unspecified: Secondary | ICD-10-CM | POA: Diagnosis not present

## 2016-05-17 DIAGNOSIS — I10 Essential (primary) hypertension: Secondary | ICD-10-CM | POA: Diagnosis not present

## 2016-05-17 DIAGNOSIS — R69 Illness, unspecified: Secondary | ICD-10-CM | POA: Diagnosis not present

## 2016-05-17 DIAGNOSIS — R55 Syncope and collapse: Secondary | ICD-10-CM | POA: Diagnosis not present

## 2016-05-17 DIAGNOSIS — R9082 White matter disease, unspecified: Secondary | ICD-10-CM | POA: Diagnosis not present

## 2016-05-17 DIAGNOSIS — R001 Bradycardia, unspecified: Secondary | ICD-10-CM | POA: Diagnosis not present

## 2016-05-17 DIAGNOSIS — K219 Gastro-esophageal reflux disease without esophagitis: Secondary | ICD-10-CM | POA: Diagnosis not present

## 2016-05-17 DIAGNOSIS — J439 Emphysema, unspecified: Secondary | ICD-10-CM | POA: Diagnosis not present

## 2016-05-17 DIAGNOSIS — I480 Paroxysmal atrial fibrillation: Secondary | ICD-10-CM | POA: Diagnosis not present

## 2016-05-17 DIAGNOSIS — I4891 Unspecified atrial fibrillation: Secondary | ICD-10-CM | POA: Diagnosis not present

## 2016-05-17 DIAGNOSIS — R4182 Altered mental status, unspecified: Secondary | ICD-10-CM | POA: Diagnosis not present

## 2016-05-17 DIAGNOSIS — N3941 Urge incontinence: Secondary | ICD-10-CM | POA: Diagnosis not present

## 2016-05-17 DIAGNOSIS — N401 Enlarged prostate with lower urinary tract symptoms: Secondary | ICD-10-CM | POA: Diagnosis not present

## 2016-05-17 DIAGNOSIS — J9811 Atelectasis: Secondary | ICD-10-CM | POA: Diagnosis not present

## 2016-05-18 DIAGNOSIS — Z9181 History of falling: Secondary | ICD-10-CM | POA: Diagnosis not present

## 2016-05-18 DIAGNOSIS — R55 Syncope and collapse: Secondary | ICD-10-CM | POA: Diagnosis not present

## 2016-05-18 DIAGNOSIS — N400 Enlarged prostate without lower urinary tract symptoms: Secondary | ICD-10-CM | POA: Diagnosis not present

## 2016-05-18 DIAGNOSIS — I4891 Unspecified atrial fibrillation: Secondary | ICD-10-CM | POA: Diagnosis not present

## 2016-05-18 DIAGNOSIS — I639 Cerebral infarction, unspecified: Secondary | ICD-10-CM | POA: Diagnosis not present

## 2016-05-18 DIAGNOSIS — I69322 Dysarthria following cerebral infarction: Secondary | ICD-10-CM | POA: Diagnosis not present

## 2016-05-18 DIAGNOSIS — I69354 Hemiplegia and hemiparesis following cerebral infarction affecting left non-dominant side: Secondary | ICD-10-CM | POA: Diagnosis not present

## 2016-05-18 DIAGNOSIS — Z7982 Long term (current) use of aspirin: Secondary | ICD-10-CM | POA: Diagnosis not present

## 2016-05-18 DIAGNOSIS — R69 Illness, unspecified: Secondary | ICD-10-CM | POA: Diagnosis not present

## 2016-05-18 DIAGNOSIS — I480 Paroxysmal atrial fibrillation: Secondary | ICD-10-CM | POA: Diagnosis not present

## 2016-05-18 DIAGNOSIS — I10 Essential (primary) hypertension: Secondary | ICD-10-CM | POA: Diagnosis not present

## 2016-05-18 DIAGNOSIS — I69393 Ataxia following cerebral infarction: Secondary | ICD-10-CM | POA: Diagnosis not present

## 2016-05-18 DIAGNOSIS — G2581 Restless legs syndrome: Secondary | ICD-10-CM | POA: Diagnosis not present

## 2016-05-21 DIAGNOSIS — G2581 Restless legs syndrome: Secondary | ICD-10-CM | POA: Diagnosis not present

## 2016-05-21 DIAGNOSIS — N400 Enlarged prostate without lower urinary tract symptoms: Secondary | ICD-10-CM | POA: Diagnosis not present

## 2016-05-21 DIAGNOSIS — R69 Illness, unspecified: Secondary | ICD-10-CM | POA: Diagnosis not present

## 2016-05-21 DIAGNOSIS — I69322 Dysarthria following cerebral infarction: Secondary | ICD-10-CM | POA: Diagnosis not present

## 2016-05-21 DIAGNOSIS — I480 Paroxysmal atrial fibrillation: Secondary | ICD-10-CM | POA: Diagnosis not present

## 2016-05-21 DIAGNOSIS — I69354 Hemiplegia and hemiparesis following cerebral infarction affecting left non-dominant side: Secondary | ICD-10-CM | POA: Diagnosis not present

## 2016-05-21 DIAGNOSIS — Z9181 History of falling: Secondary | ICD-10-CM | POA: Diagnosis not present

## 2016-05-21 DIAGNOSIS — I10 Essential (primary) hypertension: Secondary | ICD-10-CM | POA: Diagnosis not present

## 2016-05-21 DIAGNOSIS — Z7982 Long term (current) use of aspirin: Secondary | ICD-10-CM | POA: Diagnosis not present

## 2016-05-21 DIAGNOSIS — I69393 Ataxia following cerebral infarction: Secondary | ICD-10-CM | POA: Diagnosis not present

## 2016-05-22 DIAGNOSIS — I69354 Hemiplegia and hemiparesis following cerebral infarction affecting left non-dominant side: Secondary | ICD-10-CM | POA: Diagnosis not present

## 2016-05-22 DIAGNOSIS — Z7982 Long term (current) use of aspirin: Secondary | ICD-10-CM | POA: Diagnosis not present

## 2016-05-22 DIAGNOSIS — I69322 Dysarthria following cerebral infarction: Secondary | ICD-10-CM | POA: Diagnosis not present

## 2016-05-22 DIAGNOSIS — I69393 Ataxia following cerebral infarction: Secondary | ICD-10-CM | POA: Diagnosis not present

## 2016-05-22 DIAGNOSIS — I480 Paroxysmal atrial fibrillation: Secondary | ICD-10-CM | POA: Diagnosis not present

## 2016-05-22 DIAGNOSIS — Z9181 History of falling: Secondary | ICD-10-CM | POA: Diagnosis not present

## 2016-05-22 DIAGNOSIS — I10 Essential (primary) hypertension: Secondary | ICD-10-CM | POA: Diagnosis not present

## 2016-05-22 DIAGNOSIS — R69 Illness, unspecified: Secondary | ICD-10-CM | POA: Diagnosis not present

## 2016-05-22 DIAGNOSIS — G2581 Restless legs syndrome: Secondary | ICD-10-CM | POA: Diagnosis not present

## 2016-05-22 DIAGNOSIS — N400 Enlarged prostate without lower urinary tract symptoms: Secondary | ICD-10-CM | POA: Diagnosis not present

## 2016-05-23 DIAGNOSIS — Z9181 History of falling: Secondary | ICD-10-CM | POA: Diagnosis not present

## 2016-05-23 DIAGNOSIS — G2581 Restless legs syndrome: Secondary | ICD-10-CM | POA: Diagnosis not present

## 2016-05-23 DIAGNOSIS — Z7982 Long term (current) use of aspirin: Secondary | ICD-10-CM | POA: Diagnosis not present

## 2016-05-23 DIAGNOSIS — I69322 Dysarthria following cerebral infarction: Secondary | ICD-10-CM | POA: Diagnosis not present

## 2016-05-23 DIAGNOSIS — R69 Illness, unspecified: Secondary | ICD-10-CM | POA: Diagnosis not present

## 2016-05-23 DIAGNOSIS — I69393 Ataxia following cerebral infarction: Secondary | ICD-10-CM | POA: Diagnosis not present

## 2016-05-23 DIAGNOSIS — I69354 Hemiplegia and hemiparesis following cerebral infarction affecting left non-dominant side: Secondary | ICD-10-CM | POA: Diagnosis not present

## 2016-05-23 DIAGNOSIS — N400 Enlarged prostate without lower urinary tract symptoms: Secondary | ICD-10-CM | POA: Diagnosis not present

## 2016-05-23 DIAGNOSIS — I480 Paroxysmal atrial fibrillation: Secondary | ICD-10-CM | POA: Diagnosis not present

## 2016-05-23 DIAGNOSIS — I10 Essential (primary) hypertension: Secondary | ICD-10-CM | POA: Diagnosis not present

## 2016-05-25 DIAGNOSIS — Z7982 Long term (current) use of aspirin: Secondary | ICD-10-CM | POA: Diagnosis not present

## 2016-05-25 DIAGNOSIS — I480 Paroxysmal atrial fibrillation: Secondary | ICD-10-CM | POA: Diagnosis not present

## 2016-05-25 DIAGNOSIS — N400 Enlarged prostate without lower urinary tract symptoms: Secondary | ICD-10-CM | POA: Diagnosis not present

## 2016-05-25 DIAGNOSIS — I69354 Hemiplegia and hemiparesis following cerebral infarction affecting left non-dominant side: Secondary | ICD-10-CM | POA: Diagnosis not present

## 2016-05-25 DIAGNOSIS — Z9181 History of falling: Secondary | ICD-10-CM | POA: Diagnosis not present

## 2016-05-25 DIAGNOSIS — I69393 Ataxia following cerebral infarction: Secondary | ICD-10-CM | POA: Diagnosis not present

## 2016-05-25 DIAGNOSIS — I10 Essential (primary) hypertension: Secondary | ICD-10-CM | POA: Diagnosis not present

## 2016-05-25 DIAGNOSIS — I69322 Dysarthria following cerebral infarction: Secondary | ICD-10-CM | POA: Diagnosis not present

## 2016-05-25 DIAGNOSIS — R69 Illness, unspecified: Secondary | ICD-10-CM | POA: Diagnosis not present

## 2016-05-25 DIAGNOSIS — G2581 Restless legs syndrome: Secondary | ICD-10-CM | POA: Diagnosis not present

## 2016-05-28 DIAGNOSIS — R69 Illness, unspecified: Secondary | ICD-10-CM | POA: Diagnosis not present

## 2016-05-28 DIAGNOSIS — I10 Essential (primary) hypertension: Secondary | ICD-10-CM | POA: Diagnosis not present

## 2016-05-28 DIAGNOSIS — I69393 Ataxia following cerebral infarction: Secondary | ICD-10-CM | POA: Diagnosis not present

## 2016-05-28 DIAGNOSIS — Z9181 History of falling: Secondary | ICD-10-CM | POA: Diagnosis not present

## 2016-05-28 DIAGNOSIS — N400 Enlarged prostate without lower urinary tract symptoms: Secondary | ICD-10-CM | POA: Diagnosis not present

## 2016-05-28 DIAGNOSIS — I69322 Dysarthria following cerebral infarction: Secondary | ICD-10-CM | POA: Diagnosis not present

## 2016-05-28 DIAGNOSIS — G2581 Restless legs syndrome: Secondary | ICD-10-CM | POA: Diagnosis not present

## 2016-05-28 DIAGNOSIS — I69354 Hemiplegia and hemiparesis following cerebral infarction affecting left non-dominant side: Secondary | ICD-10-CM | POA: Diagnosis not present

## 2016-05-28 DIAGNOSIS — Z7982 Long term (current) use of aspirin: Secondary | ICD-10-CM | POA: Diagnosis not present

## 2016-05-28 DIAGNOSIS — I480 Paroxysmal atrial fibrillation: Secondary | ICD-10-CM | POA: Diagnosis not present

## 2016-05-30 DIAGNOSIS — R413 Other amnesia: Secondary | ICD-10-CM | POA: Diagnosis not present

## 2016-05-30 DIAGNOSIS — I69354 Hemiplegia and hemiparesis following cerebral infarction affecting left non-dominant side: Secondary | ICD-10-CM | POA: Diagnosis not present

## 2016-05-30 DIAGNOSIS — R69 Illness, unspecified: Secondary | ICD-10-CM | POA: Diagnosis not present

## 2016-05-30 DIAGNOSIS — I480 Paroxysmal atrial fibrillation: Secondary | ICD-10-CM | POA: Diagnosis not present

## 2016-05-30 DIAGNOSIS — I69322 Dysarthria following cerebral infarction: Secondary | ICD-10-CM | POA: Diagnosis not present

## 2016-05-30 DIAGNOSIS — Z9181 History of falling: Secondary | ICD-10-CM | POA: Diagnosis not present

## 2016-05-30 DIAGNOSIS — I69393 Ataxia following cerebral infarction: Secondary | ICD-10-CM | POA: Diagnosis not present

## 2016-05-30 DIAGNOSIS — M7060 Trochanteric bursitis, unspecified hip: Secondary | ICD-10-CM | POA: Diagnosis not present

## 2016-05-30 DIAGNOSIS — I639 Cerebral infarction, unspecified: Secondary | ICD-10-CM | POA: Diagnosis not present

## 2016-05-30 DIAGNOSIS — I10 Essential (primary) hypertension: Secondary | ICD-10-CM | POA: Diagnosis not present

## 2016-05-30 DIAGNOSIS — Z7982 Long term (current) use of aspirin: Secondary | ICD-10-CM | POA: Diagnosis not present

## 2016-05-30 DIAGNOSIS — G2581 Restless legs syndrome: Secondary | ICD-10-CM | POA: Diagnosis not present

## 2016-05-30 DIAGNOSIS — R55 Syncope and collapse: Secondary | ICD-10-CM | POA: Diagnosis not present

## 2016-05-30 DIAGNOSIS — N400 Enlarged prostate without lower urinary tract symptoms: Secondary | ICD-10-CM | POA: Diagnosis not present

## 2016-05-31 DIAGNOSIS — I69322 Dysarthria following cerebral infarction: Secondary | ICD-10-CM | POA: Diagnosis not present

## 2016-05-31 DIAGNOSIS — R69 Illness, unspecified: Secondary | ICD-10-CM | POA: Diagnosis not present

## 2016-05-31 DIAGNOSIS — N400 Enlarged prostate without lower urinary tract symptoms: Secondary | ICD-10-CM | POA: Diagnosis not present

## 2016-05-31 DIAGNOSIS — Z9181 History of falling: Secondary | ICD-10-CM | POA: Diagnosis not present

## 2016-05-31 DIAGNOSIS — Z7982 Long term (current) use of aspirin: Secondary | ICD-10-CM | POA: Diagnosis not present

## 2016-05-31 DIAGNOSIS — I10 Essential (primary) hypertension: Secondary | ICD-10-CM | POA: Diagnosis not present

## 2016-05-31 DIAGNOSIS — I69354 Hemiplegia and hemiparesis following cerebral infarction affecting left non-dominant side: Secondary | ICD-10-CM | POA: Diagnosis not present

## 2016-05-31 DIAGNOSIS — I480 Paroxysmal atrial fibrillation: Secondary | ICD-10-CM | POA: Diagnosis not present

## 2016-05-31 DIAGNOSIS — I69393 Ataxia following cerebral infarction: Secondary | ICD-10-CM | POA: Diagnosis not present

## 2016-05-31 DIAGNOSIS — G2581 Restless legs syndrome: Secondary | ICD-10-CM | POA: Diagnosis not present

## 2016-06-01 DIAGNOSIS — R69 Illness, unspecified: Secondary | ICD-10-CM | POA: Diagnosis not present

## 2016-06-01 DIAGNOSIS — I69322 Dysarthria following cerebral infarction: Secondary | ICD-10-CM | POA: Diagnosis not present

## 2016-06-01 DIAGNOSIS — I69393 Ataxia following cerebral infarction: Secondary | ICD-10-CM | POA: Diagnosis not present

## 2016-06-01 DIAGNOSIS — I10 Essential (primary) hypertension: Secondary | ICD-10-CM | POA: Diagnosis not present

## 2016-06-01 DIAGNOSIS — I480 Paroxysmal atrial fibrillation: Secondary | ICD-10-CM | POA: Diagnosis not present

## 2016-06-01 DIAGNOSIS — I69354 Hemiplegia and hemiparesis following cerebral infarction affecting left non-dominant side: Secondary | ICD-10-CM | POA: Diagnosis not present

## 2016-06-01 DIAGNOSIS — G2581 Restless legs syndrome: Secondary | ICD-10-CM | POA: Diagnosis not present

## 2016-06-01 DIAGNOSIS — N400 Enlarged prostate without lower urinary tract symptoms: Secondary | ICD-10-CM | POA: Diagnosis not present

## 2016-06-01 DIAGNOSIS — Z7982 Long term (current) use of aspirin: Secondary | ICD-10-CM | POA: Diagnosis not present

## 2016-06-01 DIAGNOSIS — Z9181 History of falling: Secondary | ICD-10-CM | POA: Diagnosis not present

## 2016-06-04 DIAGNOSIS — N400 Enlarged prostate without lower urinary tract symptoms: Secondary | ICD-10-CM | POA: Diagnosis not present

## 2016-06-04 DIAGNOSIS — Z9181 History of falling: Secondary | ICD-10-CM | POA: Diagnosis not present

## 2016-06-04 DIAGNOSIS — I480 Paroxysmal atrial fibrillation: Secondary | ICD-10-CM | POA: Diagnosis not present

## 2016-06-04 DIAGNOSIS — I69322 Dysarthria following cerebral infarction: Secondary | ICD-10-CM | POA: Diagnosis not present

## 2016-06-04 DIAGNOSIS — I69354 Hemiplegia and hemiparesis following cerebral infarction affecting left non-dominant side: Secondary | ICD-10-CM | POA: Diagnosis not present

## 2016-06-04 DIAGNOSIS — G2581 Restless legs syndrome: Secondary | ICD-10-CM | POA: Diagnosis not present

## 2016-06-04 DIAGNOSIS — I69393 Ataxia following cerebral infarction: Secondary | ICD-10-CM | POA: Diagnosis not present

## 2016-06-04 DIAGNOSIS — R69 Illness, unspecified: Secondary | ICD-10-CM | POA: Diagnosis not present

## 2016-06-04 DIAGNOSIS — I10 Essential (primary) hypertension: Secondary | ICD-10-CM | POA: Diagnosis not present

## 2016-06-04 DIAGNOSIS — Z7982 Long term (current) use of aspirin: Secondary | ICD-10-CM | POA: Diagnosis not present

## 2016-06-06 DIAGNOSIS — Z9181 History of falling: Secondary | ICD-10-CM | POA: Diagnosis not present

## 2016-06-06 DIAGNOSIS — N400 Enlarged prostate without lower urinary tract symptoms: Secondary | ICD-10-CM | POA: Diagnosis not present

## 2016-06-06 DIAGNOSIS — G2581 Restless legs syndrome: Secondary | ICD-10-CM | POA: Diagnosis not present

## 2016-06-06 DIAGNOSIS — I69393 Ataxia following cerebral infarction: Secondary | ICD-10-CM | POA: Diagnosis not present

## 2016-06-06 DIAGNOSIS — I480 Paroxysmal atrial fibrillation: Secondary | ICD-10-CM | POA: Diagnosis not present

## 2016-06-06 DIAGNOSIS — I10 Essential (primary) hypertension: Secondary | ICD-10-CM | POA: Diagnosis not present

## 2016-06-06 DIAGNOSIS — R69 Illness, unspecified: Secondary | ICD-10-CM | POA: Diagnosis not present

## 2016-06-06 DIAGNOSIS — Z7982 Long term (current) use of aspirin: Secondary | ICD-10-CM | POA: Diagnosis not present

## 2016-06-06 DIAGNOSIS — I69322 Dysarthria following cerebral infarction: Secondary | ICD-10-CM | POA: Diagnosis not present

## 2016-06-06 DIAGNOSIS — I69354 Hemiplegia and hemiparesis following cerebral infarction affecting left non-dominant side: Secondary | ICD-10-CM | POA: Diagnosis not present

## 2016-06-07 DIAGNOSIS — G2581 Restless legs syndrome: Secondary | ICD-10-CM | POA: Diagnosis not present

## 2016-06-07 DIAGNOSIS — R69 Illness, unspecified: Secondary | ICD-10-CM | POA: Diagnosis not present

## 2016-06-07 DIAGNOSIS — Z9181 History of falling: Secondary | ICD-10-CM | POA: Diagnosis not present

## 2016-06-07 DIAGNOSIS — I10 Essential (primary) hypertension: Secondary | ICD-10-CM | POA: Diagnosis not present

## 2016-06-07 DIAGNOSIS — Z7982 Long term (current) use of aspirin: Secondary | ICD-10-CM | POA: Diagnosis not present

## 2016-06-07 DIAGNOSIS — I480 Paroxysmal atrial fibrillation: Secondary | ICD-10-CM | POA: Diagnosis not present

## 2016-06-07 DIAGNOSIS — I69322 Dysarthria following cerebral infarction: Secondary | ICD-10-CM | POA: Diagnosis not present

## 2016-06-07 DIAGNOSIS — I69393 Ataxia following cerebral infarction: Secondary | ICD-10-CM | POA: Diagnosis not present

## 2016-06-07 DIAGNOSIS — I69354 Hemiplegia and hemiparesis following cerebral infarction affecting left non-dominant side: Secondary | ICD-10-CM | POA: Diagnosis not present

## 2016-06-07 DIAGNOSIS — N400 Enlarged prostate without lower urinary tract symptoms: Secondary | ICD-10-CM | POA: Diagnosis not present

## 2016-06-08 DIAGNOSIS — Z7982 Long term (current) use of aspirin: Secondary | ICD-10-CM | POA: Diagnosis not present

## 2016-06-08 DIAGNOSIS — I69393 Ataxia following cerebral infarction: Secondary | ICD-10-CM | POA: Diagnosis not present

## 2016-06-08 DIAGNOSIS — G2581 Restless legs syndrome: Secondary | ICD-10-CM | POA: Diagnosis not present

## 2016-06-08 DIAGNOSIS — Z9181 History of falling: Secondary | ICD-10-CM | POA: Diagnosis not present

## 2016-06-08 DIAGNOSIS — I480 Paroxysmal atrial fibrillation: Secondary | ICD-10-CM | POA: Diagnosis not present

## 2016-06-08 DIAGNOSIS — I10 Essential (primary) hypertension: Secondary | ICD-10-CM | POA: Diagnosis not present

## 2016-06-08 DIAGNOSIS — I69354 Hemiplegia and hemiparesis following cerebral infarction affecting left non-dominant side: Secondary | ICD-10-CM | POA: Diagnosis not present

## 2016-06-08 DIAGNOSIS — I69322 Dysarthria following cerebral infarction: Secondary | ICD-10-CM | POA: Diagnosis not present

## 2016-06-08 DIAGNOSIS — R69 Illness, unspecified: Secondary | ICD-10-CM | POA: Diagnosis not present

## 2016-06-08 DIAGNOSIS — N400 Enlarged prostate without lower urinary tract symptoms: Secondary | ICD-10-CM | POA: Diagnosis not present

## 2016-06-11 DIAGNOSIS — M9903 Segmental and somatic dysfunction of lumbar region: Secondary | ICD-10-CM | POA: Diagnosis not present

## 2016-06-11 DIAGNOSIS — M62838 Other muscle spasm: Secondary | ICD-10-CM | POA: Diagnosis not present

## 2016-06-11 DIAGNOSIS — M9901 Segmental and somatic dysfunction of cervical region: Secondary | ICD-10-CM | POA: Diagnosis not present

## 2016-06-11 DIAGNOSIS — M5124 Other intervertebral disc displacement, thoracic region: Secondary | ICD-10-CM | POA: Diagnosis not present

## 2016-06-11 DIAGNOSIS — M62830 Muscle spasm of back: Secondary | ICD-10-CM | POA: Diagnosis not present

## 2016-06-12 DIAGNOSIS — Z9181 History of falling: Secondary | ICD-10-CM | POA: Diagnosis not present

## 2016-06-12 DIAGNOSIS — N400 Enlarged prostate without lower urinary tract symptoms: Secondary | ICD-10-CM | POA: Diagnosis not present

## 2016-06-12 DIAGNOSIS — G2581 Restless legs syndrome: Secondary | ICD-10-CM | POA: Diagnosis not present

## 2016-06-12 DIAGNOSIS — I69354 Hemiplegia and hemiparesis following cerebral infarction affecting left non-dominant side: Secondary | ICD-10-CM | POA: Diagnosis not present

## 2016-06-12 DIAGNOSIS — I10 Essential (primary) hypertension: Secondary | ICD-10-CM | POA: Diagnosis not present

## 2016-06-12 DIAGNOSIS — I480 Paroxysmal atrial fibrillation: Secondary | ICD-10-CM | POA: Diagnosis not present

## 2016-06-12 DIAGNOSIS — R69 Illness, unspecified: Secondary | ICD-10-CM | POA: Diagnosis not present

## 2016-06-12 DIAGNOSIS — I69322 Dysarthria following cerebral infarction: Secondary | ICD-10-CM | POA: Diagnosis not present

## 2016-06-12 DIAGNOSIS — Z7982 Long term (current) use of aspirin: Secondary | ICD-10-CM | POA: Diagnosis not present

## 2016-06-12 DIAGNOSIS — I69393 Ataxia following cerebral infarction: Secondary | ICD-10-CM | POA: Diagnosis not present

## 2016-06-13 DIAGNOSIS — G2581 Restless legs syndrome: Secondary | ICD-10-CM | POA: Diagnosis not present

## 2016-06-13 DIAGNOSIS — Z9181 History of falling: Secondary | ICD-10-CM | POA: Diagnosis not present

## 2016-06-13 DIAGNOSIS — R69 Illness, unspecified: Secondary | ICD-10-CM | POA: Diagnosis not present

## 2016-06-13 DIAGNOSIS — R32 Unspecified urinary incontinence: Secondary | ICD-10-CM | POA: Diagnosis not present

## 2016-06-13 DIAGNOSIS — N400 Enlarged prostate without lower urinary tract symptoms: Secondary | ICD-10-CM | POA: Diagnosis not present

## 2016-06-13 DIAGNOSIS — I480 Paroxysmal atrial fibrillation: Secondary | ICD-10-CM | POA: Diagnosis not present

## 2016-06-13 DIAGNOSIS — Z7982 Long term (current) use of aspirin: Secondary | ICD-10-CM | POA: Diagnosis not present

## 2016-06-13 DIAGNOSIS — I10 Essential (primary) hypertension: Secondary | ICD-10-CM | POA: Diagnosis not present

## 2016-06-13 DIAGNOSIS — I69322 Dysarthria following cerebral infarction: Secondary | ICD-10-CM | POA: Diagnosis not present

## 2016-06-13 DIAGNOSIS — I69393 Ataxia following cerebral infarction: Secondary | ICD-10-CM | POA: Diagnosis not present

## 2016-06-13 DIAGNOSIS — I69354 Hemiplegia and hemiparesis following cerebral infarction affecting left non-dominant side: Secondary | ICD-10-CM | POA: Diagnosis not present

## 2016-06-14 DIAGNOSIS — I10 Essential (primary) hypertension: Secondary | ICD-10-CM | POA: Diagnosis not present

## 2016-06-14 DIAGNOSIS — N400 Enlarged prostate without lower urinary tract symptoms: Secondary | ICD-10-CM | POA: Diagnosis not present

## 2016-06-14 DIAGNOSIS — I69322 Dysarthria following cerebral infarction: Secondary | ICD-10-CM | POA: Diagnosis not present

## 2016-06-14 DIAGNOSIS — I480 Paroxysmal atrial fibrillation: Secondary | ICD-10-CM | POA: Diagnosis not present

## 2016-06-14 DIAGNOSIS — Z9181 History of falling: Secondary | ICD-10-CM | POA: Diagnosis not present

## 2016-06-14 DIAGNOSIS — R69 Illness, unspecified: Secondary | ICD-10-CM | POA: Diagnosis not present

## 2016-06-14 DIAGNOSIS — G2581 Restless legs syndrome: Secondary | ICD-10-CM | POA: Diagnosis not present

## 2016-06-14 DIAGNOSIS — Z7982 Long term (current) use of aspirin: Secondary | ICD-10-CM | POA: Diagnosis not present

## 2016-06-14 DIAGNOSIS — I69354 Hemiplegia and hemiparesis following cerebral infarction affecting left non-dominant side: Secondary | ICD-10-CM | POA: Diagnosis not present

## 2016-06-14 DIAGNOSIS — I69393 Ataxia following cerebral infarction: Secondary | ICD-10-CM | POA: Diagnosis not present

## 2016-06-15 DIAGNOSIS — N400 Enlarged prostate without lower urinary tract symptoms: Secondary | ICD-10-CM | POA: Diagnosis not present

## 2016-06-15 DIAGNOSIS — Z9181 History of falling: Secondary | ICD-10-CM | POA: Diagnosis not present

## 2016-06-15 DIAGNOSIS — I480 Paroxysmal atrial fibrillation: Secondary | ICD-10-CM | POA: Diagnosis not present

## 2016-06-15 DIAGNOSIS — Z7982 Long term (current) use of aspirin: Secondary | ICD-10-CM | POA: Diagnosis not present

## 2016-06-15 DIAGNOSIS — I69354 Hemiplegia and hemiparesis following cerebral infarction affecting left non-dominant side: Secondary | ICD-10-CM | POA: Diagnosis not present

## 2016-06-15 DIAGNOSIS — I69322 Dysarthria following cerebral infarction: Secondary | ICD-10-CM | POA: Diagnosis not present

## 2016-06-15 DIAGNOSIS — G2581 Restless legs syndrome: Secondary | ICD-10-CM | POA: Diagnosis not present

## 2016-06-15 DIAGNOSIS — I69393 Ataxia following cerebral infarction: Secondary | ICD-10-CM | POA: Diagnosis not present

## 2016-06-15 DIAGNOSIS — R69 Illness, unspecified: Secondary | ICD-10-CM | POA: Diagnosis not present

## 2016-06-15 DIAGNOSIS — I10 Essential (primary) hypertension: Secondary | ICD-10-CM | POA: Diagnosis not present

## 2016-06-18 DIAGNOSIS — I69393 Ataxia following cerebral infarction: Secondary | ICD-10-CM | POA: Diagnosis not present

## 2016-06-18 DIAGNOSIS — I480 Paroxysmal atrial fibrillation: Secondary | ICD-10-CM | POA: Diagnosis not present

## 2016-06-18 DIAGNOSIS — Z9181 History of falling: Secondary | ICD-10-CM | POA: Diagnosis not present

## 2016-06-18 DIAGNOSIS — I69322 Dysarthria following cerebral infarction: Secondary | ICD-10-CM | POA: Diagnosis not present

## 2016-06-18 DIAGNOSIS — N400 Enlarged prostate without lower urinary tract symptoms: Secondary | ICD-10-CM | POA: Diagnosis not present

## 2016-06-18 DIAGNOSIS — Z7982 Long term (current) use of aspirin: Secondary | ICD-10-CM | POA: Diagnosis not present

## 2016-06-18 DIAGNOSIS — R69 Illness, unspecified: Secondary | ICD-10-CM | POA: Diagnosis not present

## 2016-06-18 DIAGNOSIS — I69354 Hemiplegia and hemiparesis following cerebral infarction affecting left non-dominant side: Secondary | ICD-10-CM | POA: Diagnosis not present

## 2016-06-18 DIAGNOSIS — I10 Essential (primary) hypertension: Secondary | ICD-10-CM | POA: Diagnosis not present

## 2016-06-18 DIAGNOSIS — G2581 Restless legs syndrome: Secondary | ICD-10-CM | POA: Diagnosis not present

## 2016-06-19 DIAGNOSIS — Z7982 Long term (current) use of aspirin: Secondary | ICD-10-CM | POA: Diagnosis not present

## 2016-06-19 DIAGNOSIS — I10 Essential (primary) hypertension: Secondary | ICD-10-CM | POA: Diagnosis not present

## 2016-06-19 DIAGNOSIS — G2581 Restless legs syndrome: Secondary | ICD-10-CM | POA: Diagnosis not present

## 2016-06-19 DIAGNOSIS — I69393 Ataxia following cerebral infarction: Secondary | ICD-10-CM | POA: Diagnosis not present

## 2016-06-19 DIAGNOSIS — N400 Enlarged prostate without lower urinary tract symptoms: Secondary | ICD-10-CM | POA: Diagnosis not present

## 2016-06-19 DIAGNOSIS — I69354 Hemiplegia and hemiparesis following cerebral infarction affecting left non-dominant side: Secondary | ICD-10-CM | POA: Diagnosis not present

## 2016-06-19 DIAGNOSIS — R32 Unspecified urinary incontinence: Secondary | ICD-10-CM | POA: Diagnosis not present

## 2016-06-19 DIAGNOSIS — Z9181 History of falling: Secondary | ICD-10-CM | POA: Diagnosis not present

## 2016-06-19 DIAGNOSIS — R69 Illness, unspecified: Secondary | ICD-10-CM | POA: Diagnosis not present

## 2016-06-19 DIAGNOSIS — I480 Paroxysmal atrial fibrillation: Secondary | ICD-10-CM | POA: Diagnosis not present

## 2016-06-19 DIAGNOSIS — I69322 Dysarthria following cerebral infarction: Secondary | ICD-10-CM | POA: Diagnosis not present

## 2016-06-20 DIAGNOSIS — I69354 Hemiplegia and hemiparesis following cerebral infarction affecting left non-dominant side: Secondary | ICD-10-CM | POA: Diagnosis not present

## 2016-06-20 DIAGNOSIS — I69322 Dysarthria following cerebral infarction: Secondary | ICD-10-CM | POA: Diagnosis not present

## 2016-06-20 DIAGNOSIS — Z7982 Long term (current) use of aspirin: Secondary | ICD-10-CM | POA: Diagnosis not present

## 2016-06-20 DIAGNOSIS — R55 Syncope and collapse: Secondary | ICD-10-CM | POA: Diagnosis not present

## 2016-06-20 DIAGNOSIS — R69 Illness, unspecified: Secondary | ICD-10-CM | POA: Diagnosis not present

## 2016-06-20 DIAGNOSIS — N400 Enlarged prostate without lower urinary tract symptoms: Secondary | ICD-10-CM | POA: Diagnosis not present

## 2016-06-20 DIAGNOSIS — I69393 Ataxia following cerebral infarction: Secondary | ICD-10-CM | POA: Diagnosis not present

## 2016-06-20 DIAGNOSIS — G2581 Restless legs syndrome: Secondary | ICD-10-CM | POA: Diagnosis not present

## 2016-06-20 DIAGNOSIS — Z9181 History of falling: Secondary | ICD-10-CM | POA: Diagnosis not present

## 2016-06-20 DIAGNOSIS — I480 Paroxysmal atrial fibrillation: Secondary | ICD-10-CM | POA: Diagnosis not present

## 2016-06-20 DIAGNOSIS — I10 Essential (primary) hypertension: Secondary | ICD-10-CM | POA: Diagnosis not present

## 2016-06-21 DIAGNOSIS — I480 Paroxysmal atrial fibrillation: Secondary | ICD-10-CM | POA: Diagnosis not present

## 2016-06-21 DIAGNOSIS — M25561 Pain in right knee: Secondary | ICD-10-CM | POA: Diagnosis not present

## 2016-06-21 DIAGNOSIS — I10 Essential (primary) hypertension: Secondary | ICD-10-CM | POA: Diagnosis not present

## 2016-06-21 DIAGNOSIS — I69393 Ataxia following cerebral infarction: Secondary | ICD-10-CM | POA: Diagnosis not present

## 2016-06-21 DIAGNOSIS — R69 Illness, unspecified: Secondary | ICD-10-CM | POA: Diagnosis not present

## 2016-06-21 DIAGNOSIS — G2581 Restless legs syndrome: Secondary | ICD-10-CM | POA: Diagnosis not present

## 2016-06-21 DIAGNOSIS — N401 Enlarged prostate with lower urinary tract symptoms: Secondary | ICD-10-CM | POA: Diagnosis not present

## 2016-06-21 DIAGNOSIS — I69322 Dysarthria following cerebral infarction: Secondary | ICD-10-CM | POA: Diagnosis not present

## 2016-06-21 DIAGNOSIS — I69354 Hemiplegia and hemiparesis following cerebral infarction affecting left non-dominant side: Secondary | ICD-10-CM | POA: Diagnosis not present

## 2016-06-21 DIAGNOSIS — N39498 Other specified urinary incontinence: Secondary | ICD-10-CM | POA: Diagnosis not present

## 2016-06-22 DIAGNOSIS — G2581 Restless legs syndrome: Secondary | ICD-10-CM | POA: Diagnosis not present

## 2016-06-22 DIAGNOSIS — I6789 Other cerebrovascular disease: Secondary | ICD-10-CM | POA: Diagnosis not present

## 2016-06-22 DIAGNOSIS — N39498 Other specified urinary incontinence: Secondary | ICD-10-CM | POA: Diagnosis not present

## 2016-06-22 DIAGNOSIS — M2390 Unspecified internal derangement of unspecified knee: Secondary | ICD-10-CM | POA: Diagnosis not present

## 2016-06-22 DIAGNOSIS — M76899 Other specified enthesopathies of unspecified lower limb, excluding foot: Secondary | ICD-10-CM | POA: Diagnosis not present

## 2016-06-22 DIAGNOSIS — M659 Synovitis and tenosynovitis, unspecified: Secondary | ICD-10-CM | POA: Diagnosis not present

## 2016-06-22 DIAGNOSIS — M792 Neuralgia and neuritis, unspecified: Secondary | ICD-10-CM | POA: Diagnosis not present

## 2016-06-22 DIAGNOSIS — M25561 Pain in right knee: Secondary | ICD-10-CM | POA: Diagnosis not present

## 2016-06-22 DIAGNOSIS — R413 Other amnesia: Secondary | ICD-10-CM | POA: Diagnosis not present

## 2016-06-22 DIAGNOSIS — R69 Illness, unspecified: Secondary | ICD-10-CM | POA: Diagnosis not present

## 2016-06-22 DIAGNOSIS — N401 Enlarged prostate with lower urinary tract symptoms: Secondary | ICD-10-CM | POA: Diagnosis not present

## 2016-06-22 DIAGNOSIS — I10 Essential (primary) hypertension: Secondary | ICD-10-CM | POA: Diagnosis not present

## 2016-06-22 DIAGNOSIS — I69322 Dysarthria following cerebral infarction: Secondary | ICD-10-CM | POA: Diagnosis not present

## 2016-06-22 DIAGNOSIS — I480 Paroxysmal atrial fibrillation: Secondary | ICD-10-CM | POA: Diagnosis not present

## 2016-06-22 DIAGNOSIS — I69393 Ataxia following cerebral infarction: Secondary | ICD-10-CM | POA: Diagnosis not present

## 2016-06-22 DIAGNOSIS — I69354 Hemiplegia and hemiparesis following cerebral infarction affecting left non-dominant side: Secondary | ICD-10-CM | POA: Diagnosis not present

## 2016-06-25 DIAGNOSIS — M25561 Pain in right knee: Secondary | ICD-10-CM | POA: Diagnosis not present

## 2016-06-25 DIAGNOSIS — G2581 Restless legs syndrome: Secondary | ICD-10-CM | POA: Diagnosis not present

## 2016-06-25 DIAGNOSIS — I10 Essential (primary) hypertension: Secondary | ICD-10-CM | POA: Diagnosis not present

## 2016-06-25 DIAGNOSIS — I69322 Dysarthria following cerebral infarction: Secondary | ICD-10-CM | POA: Diagnosis not present

## 2016-06-25 DIAGNOSIS — I69354 Hemiplegia and hemiparesis following cerebral infarction affecting left non-dominant side: Secondary | ICD-10-CM | POA: Diagnosis not present

## 2016-06-25 DIAGNOSIS — I480 Paroxysmal atrial fibrillation: Secondary | ICD-10-CM | POA: Diagnosis not present

## 2016-06-25 DIAGNOSIS — R69 Illness, unspecified: Secondary | ICD-10-CM | POA: Diagnosis not present

## 2016-06-25 DIAGNOSIS — N401 Enlarged prostate with lower urinary tract symptoms: Secondary | ICD-10-CM | POA: Diagnosis not present

## 2016-06-25 DIAGNOSIS — N39498 Other specified urinary incontinence: Secondary | ICD-10-CM | POA: Diagnosis not present

## 2016-06-25 DIAGNOSIS — I69393 Ataxia following cerebral infarction: Secondary | ICD-10-CM | POA: Diagnosis not present

## 2016-06-26 DIAGNOSIS — I639 Cerebral infarction, unspecified: Secondary | ICD-10-CM | POA: Diagnosis not present

## 2016-06-26 DIAGNOSIS — I10 Essential (primary) hypertension: Secondary | ICD-10-CM | POA: Diagnosis not present

## 2016-06-26 DIAGNOSIS — I480 Paroxysmal atrial fibrillation: Secondary | ICD-10-CM | POA: Diagnosis not present

## 2016-06-27 DIAGNOSIS — I480 Paroxysmal atrial fibrillation: Secondary | ICD-10-CM | POA: Diagnosis not present

## 2016-06-27 DIAGNOSIS — I69354 Hemiplegia and hemiparesis following cerebral infarction affecting left non-dominant side: Secondary | ICD-10-CM | POA: Diagnosis not present

## 2016-06-27 DIAGNOSIS — I639 Cerebral infarction, unspecified: Secondary | ICD-10-CM | POA: Diagnosis not present

## 2016-06-27 DIAGNOSIS — I69393 Ataxia following cerebral infarction: Secondary | ICD-10-CM | POA: Diagnosis not present

## 2016-06-27 DIAGNOSIS — I10 Essential (primary) hypertension: Secondary | ICD-10-CM | POA: Diagnosis not present

## 2016-06-27 DIAGNOSIS — Z9181 History of falling: Secondary | ICD-10-CM | POA: Diagnosis not present

## 2016-06-27 DIAGNOSIS — N401 Enlarged prostate with lower urinary tract symptoms: Secondary | ICD-10-CM | POA: Diagnosis not present

## 2016-06-27 DIAGNOSIS — G2581 Restless legs syndrome: Secondary | ICD-10-CM | POA: Diagnosis not present

## 2016-06-27 DIAGNOSIS — R69 Illness, unspecified: Secondary | ICD-10-CM | POA: Diagnosis not present

## 2016-06-27 DIAGNOSIS — G4700 Insomnia, unspecified: Secondary | ICD-10-CM | POA: Diagnosis not present

## 2016-06-27 DIAGNOSIS — N39498 Other specified urinary incontinence: Secondary | ICD-10-CM | POA: Diagnosis not present

## 2016-06-27 DIAGNOSIS — M25569 Pain in unspecified knee: Secondary | ICD-10-CM | POA: Diagnosis not present

## 2016-06-27 DIAGNOSIS — M25561 Pain in right knee: Secondary | ICD-10-CM | POA: Diagnosis not present

## 2016-06-27 DIAGNOSIS — I69322 Dysarthria following cerebral infarction: Secondary | ICD-10-CM | POA: Diagnosis not present

## 2016-06-28 DIAGNOSIS — I480 Paroxysmal atrial fibrillation: Secondary | ICD-10-CM | POA: Diagnosis not present

## 2016-06-28 DIAGNOSIS — I10 Essential (primary) hypertension: Secondary | ICD-10-CM | POA: Diagnosis not present

## 2016-06-28 DIAGNOSIS — I69393 Ataxia following cerebral infarction: Secondary | ICD-10-CM | POA: Diagnosis not present

## 2016-06-28 DIAGNOSIS — G2581 Restless legs syndrome: Secondary | ICD-10-CM | POA: Diagnosis not present

## 2016-06-28 DIAGNOSIS — M25561 Pain in right knee: Secondary | ICD-10-CM | POA: Diagnosis not present

## 2016-06-28 DIAGNOSIS — N401 Enlarged prostate with lower urinary tract symptoms: Secondary | ICD-10-CM | POA: Diagnosis not present

## 2016-06-28 DIAGNOSIS — I69354 Hemiplegia and hemiparesis following cerebral infarction affecting left non-dominant side: Secondary | ICD-10-CM | POA: Diagnosis not present

## 2016-06-28 DIAGNOSIS — R69 Illness, unspecified: Secondary | ICD-10-CM | POA: Diagnosis not present

## 2016-06-28 DIAGNOSIS — N39498 Other specified urinary incontinence: Secondary | ICD-10-CM | POA: Diagnosis not present

## 2016-06-28 DIAGNOSIS — I69322 Dysarthria following cerebral infarction: Secondary | ICD-10-CM | POA: Diagnosis not present

## 2016-06-29 DIAGNOSIS — I69354 Hemiplegia and hemiparesis following cerebral infarction affecting left non-dominant side: Secondary | ICD-10-CM | POA: Diagnosis not present

## 2016-06-29 DIAGNOSIS — I69322 Dysarthria following cerebral infarction: Secondary | ICD-10-CM | POA: Diagnosis not present

## 2016-06-29 DIAGNOSIS — G2581 Restless legs syndrome: Secondary | ICD-10-CM | POA: Diagnosis not present

## 2016-06-29 DIAGNOSIS — I10 Essential (primary) hypertension: Secondary | ICD-10-CM | POA: Diagnosis not present

## 2016-06-29 DIAGNOSIS — I480 Paroxysmal atrial fibrillation: Secondary | ICD-10-CM | POA: Diagnosis not present

## 2016-06-29 DIAGNOSIS — N39498 Other specified urinary incontinence: Secondary | ICD-10-CM | POA: Diagnosis not present

## 2016-06-29 DIAGNOSIS — R69 Illness, unspecified: Secondary | ICD-10-CM | POA: Diagnosis not present

## 2016-06-29 DIAGNOSIS — N401 Enlarged prostate with lower urinary tract symptoms: Secondary | ICD-10-CM | POA: Diagnosis not present

## 2016-06-29 DIAGNOSIS — M25561 Pain in right knee: Secondary | ICD-10-CM | POA: Diagnosis not present

## 2016-06-29 DIAGNOSIS — I69393 Ataxia following cerebral infarction: Secondary | ICD-10-CM | POA: Diagnosis not present

## 2016-07-02 DIAGNOSIS — N401 Enlarged prostate with lower urinary tract symptoms: Secondary | ICD-10-CM | POA: Diagnosis not present

## 2016-07-02 DIAGNOSIS — M25562 Pain in left knee: Secondary | ICD-10-CM | POA: Diagnosis not present

## 2016-07-02 DIAGNOSIS — I69354 Hemiplegia and hemiparesis following cerebral infarction affecting left non-dominant side: Secondary | ICD-10-CM | POA: Diagnosis not present

## 2016-07-02 DIAGNOSIS — I480 Paroxysmal atrial fibrillation: Secondary | ICD-10-CM | POA: Diagnosis not present

## 2016-07-02 DIAGNOSIS — I10 Essential (primary) hypertension: Secondary | ICD-10-CM | POA: Diagnosis not present

## 2016-07-02 DIAGNOSIS — I69322 Dysarthria following cerebral infarction: Secondary | ICD-10-CM | POA: Diagnosis not present

## 2016-07-02 DIAGNOSIS — M25561 Pain in right knee: Secondary | ICD-10-CM | POA: Diagnosis not present

## 2016-07-02 DIAGNOSIS — N39498 Other specified urinary incontinence: Secondary | ICD-10-CM | POA: Diagnosis not present

## 2016-07-02 DIAGNOSIS — R69 Illness, unspecified: Secondary | ICD-10-CM | POA: Diagnosis not present

## 2016-07-02 DIAGNOSIS — I69393 Ataxia following cerebral infarction: Secondary | ICD-10-CM | POA: Diagnosis not present

## 2016-07-02 DIAGNOSIS — G2581 Restless legs syndrome: Secondary | ICD-10-CM | POA: Diagnosis not present

## 2016-07-04 DIAGNOSIS — N39498 Other specified urinary incontinence: Secondary | ICD-10-CM | POA: Diagnosis not present

## 2016-07-04 DIAGNOSIS — I480 Paroxysmal atrial fibrillation: Secondary | ICD-10-CM | POA: Diagnosis not present

## 2016-07-04 DIAGNOSIS — I69393 Ataxia following cerebral infarction: Secondary | ICD-10-CM | POA: Diagnosis not present

## 2016-07-04 DIAGNOSIS — R69 Illness, unspecified: Secondary | ICD-10-CM | POA: Diagnosis not present

## 2016-07-04 DIAGNOSIS — I69354 Hemiplegia and hemiparesis following cerebral infarction affecting left non-dominant side: Secondary | ICD-10-CM | POA: Diagnosis not present

## 2016-07-04 DIAGNOSIS — M25561 Pain in right knee: Secondary | ICD-10-CM | POA: Diagnosis not present

## 2016-07-04 DIAGNOSIS — I69322 Dysarthria following cerebral infarction: Secondary | ICD-10-CM | POA: Diagnosis not present

## 2016-07-04 DIAGNOSIS — N401 Enlarged prostate with lower urinary tract symptoms: Secondary | ICD-10-CM | POA: Diagnosis not present

## 2016-07-04 DIAGNOSIS — G2581 Restless legs syndrome: Secondary | ICD-10-CM | POA: Diagnosis not present

## 2016-07-04 DIAGNOSIS — I10 Essential (primary) hypertension: Secondary | ICD-10-CM | POA: Diagnosis not present

## 2016-07-06 DIAGNOSIS — N39498 Other specified urinary incontinence: Secondary | ICD-10-CM | POA: Diagnosis not present

## 2016-07-06 DIAGNOSIS — I10 Essential (primary) hypertension: Secondary | ICD-10-CM | POA: Diagnosis not present

## 2016-07-06 DIAGNOSIS — I69322 Dysarthria following cerebral infarction: Secondary | ICD-10-CM | POA: Diagnosis not present

## 2016-07-06 DIAGNOSIS — M25561 Pain in right knee: Secondary | ICD-10-CM | POA: Diagnosis not present

## 2016-07-06 DIAGNOSIS — R69 Illness, unspecified: Secondary | ICD-10-CM | POA: Diagnosis not present

## 2016-07-06 DIAGNOSIS — G2581 Restless legs syndrome: Secondary | ICD-10-CM | POA: Diagnosis not present

## 2016-07-06 DIAGNOSIS — N401 Enlarged prostate with lower urinary tract symptoms: Secondary | ICD-10-CM | POA: Diagnosis not present

## 2016-07-06 DIAGNOSIS — I69393 Ataxia following cerebral infarction: Secondary | ICD-10-CM | POA: Diagnosis not present

## 2016-07-06 DIAGNOSIS — I480 Paroxysmal atrial fibrillation: Secondary | ICD-10-CM | POA: Diagnosis not present

## 2016-07-06 DIAGNOSIS — I69354 Hemiplegia and hemiparesis following cerebral infarction affecting left non-dominant side: Secondary | ICD-10-CM | POA: Diagnosis not present

## 2016-07-09 DIAGNOSIS — M25561 Pain in right knee: Secondary | ICD-10-CM | POA: Diagnosis not present

## 2016-07-09 DIAGNOSIS — I69393 Ataxia following cerebral infarction: Secondary | ICD-10-CM | POA: Diagnosis not present

## 2016-07-09 DIAGNOSIS — R69 Illness, unspecified: Secondary | ICD-10-CM | POA: Diagnosis not present

## 2016-07-09 DIAGNOSIS — I69322 Dysarthria following cerebral infarction: Secondary | ICD-10-CM | POA: Diagnosis not present

## 2016-07-09 DIAGNOSIS — G2581 Restless legs syndrome: Secondary | ICD-10-CM | POA: Diagnosis not present

## 2016-07-09 DIAGNOSIS — I10 Essential (primary) hypertension: Secondary | ICD-10-CM | POA: Diagnosis not present

## 2016-07-09 DIAGNOSIS — I69354 Hemiplegia and hemiparesis following cerebral infarction affecting left non-dominant side: Secondary | ICD-10-CM | POA: Diagnosis not present

## 2016-07-09 DIAGNOSIS — N39498 Other specified urinary incontinence: Secondary | ICD-10-CM | POA: Diagnosis not present

## 2016-07-09 DIAGNOSIS — I480 Paroxysmal atrial fibrillation: Secondary | ICD-10-CM | POA: Diagnosis not present

## 2016-07-09 DIAGNOSIS — N401 Enlarged prostate with lower urinary tract symptoms: Secondary | ICD-10-CM | POA: Diagnosis not present

## 2016-07-11 DIAGNOSIS — G2581 Restless legs syndrome: Secondary | ICD-10-CM | POA: Diagnosis not present

## 2016-07-11 DIAGNOSIS — M25561 Pain in right knee: Secondary | ICD-10-CM | POA: Diagnosis not present

## 2016-07-11 DIAGNOSIS — R69 Illness, unspecified: Secondary | ICD-10-CM | POA: Diagnosis not present

## 2016-07-11 DIAGNOSIS — I69322 Dysarthria following cerebral infarction: Secondary | ICD-10-CM | POA: Diagnosis not present

## 2016-07-11 DIAGNOSIS — I69393 Ataxia following cerebral infarction: Secondary | ICD-10-CM | POA: Diagnosis not present

## 2016-07-11 DIAGNOSIS — I10 Essential (primary) hypertension: Secondary | ICD-10-CM | POA: Diagnosis not present

## 2016-07-11 DIAGNOSIS — I480 Paroxysmal atrial fibrillation: Secondary | ICD-10-CM | POA: Diagnosis not present

## 2016-07-11 DIAGNOSIS — N39498 Other specified urinary incontinence: Secondary | ICD-10-CM | POA: Diagnosis not present

## 2016-07-11 DIAGNOSIS — N401 Enlarged prostate with lower urinary tract symptoms: Secondary | ICD-10-CM | POA: Diagnosis not present

## 2016-07-11 DIAGNOSIS — I69354 Hemiplegia and hemiparesis following cerebral infarction affecting left non-dominant side: Secondary | ICD-10-CM | POA: Diagnosis not present

## 2016-07-13 DIAGNOSIS — N401 Enlarged prostate with lower urinary tract symptoms: Secondary | ICD-10-CM | POA: Diagnosis not present

## 2016-07-13 DIAGNOSIS — M62830 Muscle spasm of back: Secondary | ICD-10-CM | POA: Diagnosis not present

## 2016-07-13 DIAGNOSIS — N39498 Other specified urinary incontinence: Secondary | ICD-10-CM | POA: Diagnosis not present

## 2016-07-13 DIAGNOSIS — I10 Essential (primary) hypertension: Secondary | ICD-10-CM | POA: Diagnosis not present

## 2016-07-13 DIAGNOSIS — G2581 Restless legs syndrome: Secondary | ICD-10-CM | POA: Diagnosis not present

## 2016-07-13 DIAGNOSIS — M62838 Other muscle spasm: Secondary | ICD-10-CM | POA: Diagnosis not present

## 2016-07-13 DIAGNOSIS — I69393 Ataxia following cerebral infarction: Secondary | ICD-10-CM | POA: Diagnosis not present

## 2016-07-13 DIAGNOSIS — M25561 Pain in right knee: Secondary | ICD-10-CM | POA: Diagnosis not present

## 2016-07-13 DIAGNOSIS — I69322 Dysarthria following cerebral infarction: Secondary | ICD-10-CM | POA: Diagnosis not present

## 2016-07-13 DIAGNOSIS — I69354 Hemiplegia and hemiparesis following cerebral infarction affecting left non-dominant side: Secondary | ICD-10-CM | POA: Diagnosis not present

## 2016-07-13 DIAGNOSIS — M5124 Other intervertebral disc displacement, thoracic region: Secondary | ICD-10-CM | POA: Diagnosis not present

## 2016-07-13 DIAGNOSIS — M9903 Segmental and somatic dysfunction of lumbar region: Secondary | ICD-10-CM | POA: Diagnosis not present

## 2016-07-13 DIAGNOSIS — R69 Illness, unspecified: Secondary | ICD-10-CM | POA: Diagnosis not present

## 2016-07-13 DIAGNOSIS — M9901 Segmental and somatic dysfunction of cervical region: Secondary | ICD-10-CM | POA: Diagnosis not present

## 2016-07-13 DIAGNOSIS — I480 Paroxysmal atrial fibrillation: Secondary | ICD-10-CM | POA: Diagnosis not present

## 2016-07-16 DIAGNOSIS — I69322 Dysarthria following cerebral infarction: Secondary | ICD-10-CM | POA: Diagnosis not present

## 2016-07-16 DIAGNOSIS — M25561 Pain in right knee: Secondary | ICD-10-CM | POA: Diagnosis not present

## 2016-07-16 DIAGNOSIS — I69354 Hemiplegia and hemiparesis following cerebral infarction affecting left non-dominant side: Secondary | ICD-10-CM | POA: Diagnosis not present

## 2016-07-16 DIAGNOSIS — R69 Illness, unspecified: Secondary | ICD-10-CM | POA: Diagnosis not present

## 2016-07-16 DIAGNOSIS — I10 Essential (primary) hypertension: Secondary | ICD-10-CM | POA: Diagnosis not present

## 2016-07-16 DIAGNOSIS — N39498 Other specified urinary incontinence: Secondary | ICD-10-CM | POA: Diagnosis not present

## 2016-07-16 DIAGNOSIS — G2581 Restless legs syndrome: Secondary | ICD-10-CM | POA: Diagnosis not present

## 2016-07-16 DIAGNOSIS — N401 Enlarged prostate with lower urinary tract symptoms: Secondary | ICD-10-CM | POA: Diagnosis not present

## 2016-07-16 DIAGNOSIS — I480 Paroxysmal atrial fibrillation: Secondary | ICD-10-CM | POA: Diagnosis not present

## 2016-07-16 DIAGNOSIS — I69393 Ataxia following cerebral infarction: Secondary | ICD-10-CM | POA: Diagnosis not present

## 2016-07-20 DIAGNOSIS — I69393 Ataxia following cerebral infarction: Secondary | ICD-10-CM | POA: Diagnosis not present

## 2016-07-20 DIAGNOSIS — G2581 Restless legs syndrome: Secondary | ICD-10-CM | POA: Diagnosis not present

## 2016-07-20 DIAGNOSIS — I69322 Dysarthria following cerebral infarction: Secondary | ICD-10-CM | POA: Diagnosis not present

## 2016-07-20 DIAGNOSIS — N401 Enlarged prostate with lower urinary tract symptoms: Secondary | ICD-10-CM | POA: Diagnosis not present

## 2016-07-20 DIAGNOSIS — I480 Paroxysmal atrial fibrillation: Secondary | ICD-10-CM | POA: Diagnosis not present

## 2016-07-20 DIAGNOSIS — R69 Illness, unspecified: Secondary | ICD-10-CM | POA: Diagnosis not present

## 2016-07-20 DIAGNOSIS — N39498 Other specified urinary incontinence: Secondary | ICD-10-CM | POA: Diagnosis not present

## 2016-07-20 DIAGNOSIS — I69354 Hemiplegia and hemiparesis following cerebral infarction affecting left non-dominant side: Secondary | ICD-10-CM | POA: Diagnosis not present

## 2016-07-20 DIAGNOSIS — M25561 Pain in right knee: Secondary | ICD-10-CM | POA: Diagnosis not present

## 2016-07-20 DIAGNOSIS — I10 Essential (primary) hypertension: Secondary | ICD-10-CM | POA: Diagnosis not present

## 2016-07-23 DIAGNOSIS — L239 Allergic contact dermatitis, unspecified cause: Secondary | ICD-10-CM | POA: Diagnosis not present

## 2016-07-24 DIAGNOSIS — R270 Ataxia, unspecified: Secondary | ICD-10-CM | POA: Diagnosis not present

## 2016-07-24 DIAGNOSIS — R69 Illness, unspecified: Secondary | ICD-10-CM | POA: Diagnosis not present

## 2016-07-24 DIAGNOSIS — I639 Cerebral infarction, unspecified: Secondary | ICD-10-CM | POA: Diagnosis not present

## 2016-07-24 DIAGNOSIS — G2581 Restless legs syndrome: Secondary | ICD-10-CM | POA: Diagnosis not present

## 2016-07-24 DIAGNOSIS — G4700 Insomnia, unspecified: Secondary | ICD-10-CM | POA: Diagnosis not present

## 2016-07-25 DIAGNOSIS — I69322 Dysarthria following cerebral infarction: Secondary | ICD-10-CM | POA: Diagnosis not present

## 2016-07-25 DIAGNOSIS — I69393 Ataxia following cerebral infarction: Secondary | ICD-10-CM | POA: Diagnosis not present

## 2016-07-25 DIAGNOSIS — N401 Enlarged prostate with lower urinary tract symptoms: Secondary | ICD-10-CM | POA: Diagnosis not present

## 2016-07-25 DIAGNOSIS — R69 Illness, unspecified: Secondary | ICD-10-CM | POA: Diagnosis not present

## 2016-07-25 DIAGNOSIS — I10 Essential (primary) hypertension: Secondary | ICD-10-CM | POA: Diagnosis not present

## 2016-07-25 DIAGNOSIS — M25561 Pain in right knee: Secondary | ICD-10-CM | POA: Diagnosis not present

## 2016-07-25 DIAGNOSIS — I69354 Hemiplegia and hemiparesis following cerebral infarction affecting left non-dominant side: Secondary | ICD-10-CM | POA: Diagnosis not present

## 2016-07-25 DIAGNOSIS — N39498 Other specified urinary incontinence: Secondary | ICD-10-CM | POA: Diagnosis not present

## 2016-07-25 DIAGNOSIS — I480 Paroxysmal atrial fibrillation: Secondary | ICD-10-CM | POA: Diagnosis not present

## 2016-07-25 DIAGNOSIS — G2581 Restless legs syndrome: Secondary | ICD-10-CM | POA: Diagnosis not present

## 2016-08-09 DIAGNOSIS — I10 Essential (primary) hypertension: Secondary | ICD-10-CM | POA: Diagnosis not present

## 2016-08-09 DIAGNOSIS — I69354 Hemiplegia and hemiparesis following cerebral infarction affecting left non-dominant side: Secondary | ICD-10-CM | POA: Diagnosis not present

## 2016-08-09 DIAGNOSIS — N401 Enlarged prostate with lower urinary tract symptoms: Secondary | ICD-10-CM | POA: Diagnosis not present

## 2016-08-09 DIAGNOSIS — R69 Illness, unspecified: Secondary | ICD-10-CM | POA: Diagnosis not present

## 2016-08-09 DIAGNOSIS — I480 Paroxysmal atrial fibrillation: Secondary | ICD-10-CM | POA: Diagnosis not present

## 2016-08-09 DIAGNOSIS — I69322 Dysarthria following cerebral infarction: Secondary | ICD-10-CM | POA: Diagnosis not present

## 2016-08-09 DIAGNOSIS — M25561 Pain in right knee: Secondary | ICD-10-CM | POA: Diagnosis not present

## 2016-08-09 DIAGNOSIS — G2581 Restless legs syndrome: Secondary | ICD-10-CM | POA: Diagnosis not present

## 2016-08-09 DIAGNOSIS — I69393 Ataxia following cerebral infarction: Secondary | ICD-10-CM | POA: Diagnosis not present

## 2016-08-09 DIAGNOSIS — N39498 Other specified urinary incontinence: Secondary | ICD-10-CM | POA: Diagnosis not present

## 2016-08-21 DIAGNOSIS — I6782 Cerebral ischemia: Secondary | ICD-10-CM | POA: Diagnosis not present

## 2016-08-21 DIAGNOSIS — G2581 Restless legs syndrome: Secondary | ICD-10-CM | POA: Diagnosis not present

## 2016-08-21 DIAGNOSIS — I480 Paroxysmal atrial fibrillation: Secondary | ICD-10-CM | POA: Diagnosis not present

## 2016-08-21 DIAGNOSIS — R42 Dizziness and giddiness: Secondary | ICD-10-CM | POA: Diagnosis not present

## 2016-08-21 DIAGNOSIS — Z8673 Personal history of transient ischemic attack (TIA), and cerebral infarction without residual deficits: Secondary | ICD-10-CM | POA: Diagnosis not present

## 2016-08-21 DIAGNOSIS — R55 Syncope and collapse: Secondary | ICD-10-CM | POA: Diagnosis not present

## 2016-08-21 DIAGNOSIS — R110 Nausea: Secondary | ICD-10-CM | POA: Diagnosis not present

## 2016-08-21 DIAGNOSIS — R0902 Hypoxemia: Secondary | ICD-10-CM | POA: Diagnosis not present

## 2016-08-21 DIAGNOSIS — R918 Other nonspecific abnormal finding of lung field: Secondary | ICD-10-CM | POA: Diagnosis not present

## 2016-08-21 DIAGNOSIS — E871 Hypo-osmolality and hyponatremia: Secondary | ICD-10-CM | POA: Diagnosis not present

## 2016-08-22 DIAGNOSIS — K219 Gastro-esophageal reflux disease without esophagitis: Secondary | ICD-10-CM | POA: Diagnosis not present

## 2016-08-22 DIAGNOSIS — E871 Hypo-osmolality and hyponatremia: Secondary | ICD-10-CM | POA: Diagnosis not present

## 2016-08-22 DIAGNOSIS — I10 Essential (primary) hypertension: Secondary | ICD-10-CM | POA: Diagnosis not present

## 2016-08-22 DIAGNOSIS — E861 Hypovolemia: Secondary | ICD-10-CM | POA: Diagnosis not present

## 2016-08-22 DIAGNOSIS — J189 Pneumonia, unspecified organism: Secondary | ICD-10-CM | POA: Diagnosis not present

## 2016-08-22 DIAGNOSIS — G2581 Restless legs syndrome: Secondary | ICD-10-CM | POA: Diagnosis not present

## 2016-08-22 DIAGNOSIS — R55 Syncope and collapse: Secondary | ICD-10-CM | POA: Diagnosis not present

## 2016-08-22 DIAGNOSIS — I480 Paroxysmal atrial fibrillation: Secondary | ICD-10-CM | POA: Diagnosis not present

## 2016-08-22 DIAGNOSIS — R69 Illness, unspecified: Secondary | ICD-10-CM | POA: Diagnosis not present

## 2016-08-22 DIAGNOSIS — J129 Viral pneumonia, unspecified: Secondary | ICD-10-CM | POA: Diagnosis not present

## 2016-08-22 DIAGNOSIS — Z8673 Personal history of transient ischemic attack (TIA), and cerebral infarction without residual deficits: Secondary | ICD-10-CM | POA: Diagnosis not present

## 2016-08-22 DIAGNOSIS — I4891 Unspecified atrial fibrillation: Secondary | ICD-10-CM | POA: Diagnosis not present

## 2016-08-22 DIAGNOSIS — R0902 Hypoxemia: Secondary | ICD-10-CM | POA: Diagnosis not present

## 2016-08-25 DIAGNOSIS — G2581 Restless legs syndrome: Secondary | ICD-10-CM | POA: Diagnosis not present

## 2016-08-25 DIAGNOSIS — M1990 Unspecified osteoarthritis, unspecified site: Secondary | ICD-10-CM | POA: Diagnosis not present

## 2016-08-25 DIAGNOSIS — R69 Illness, unspecified: Secondary | ICD-10-CM | POA: Diagnosis not present

## 2016-08-25 DIAGNOSIS — I480 Paroxysmal atrial fibrillation: Secondary | ICD-10-CM | POA: Diagnosis not present

## 2016-08-25 DIAGNOSIS — E782 Mixed hyperlipidemia: Secondary | ICD-10-CM | POA: Diagnosis not present

## 2016-08-25 DIAGNOSIS — K219 Gastro-esophageal reflux disease without esophagitis: Secondary | ICD-10-CM | POA: Diagnosis not present

## 2016-08-25 DIAGNOSIS — I10 Essential (primary) hypertension: Secondary | ICD-10-CM | POA: Diagnosis not present

## 2016-08-25 DIAGNOSIS — R7303 Prediabetes: Secondary | ICD-10-CM | POA: Diagnosis not present

## 2016-08-25 DIAGNOSIS — N400 Enlarged prostate without lower urinary tract symptoms: Secondary | ICD-10-CM | POA: Diagnosis not present

## 2016-08-28 DIAGNOSIS — M1990 Unspecified osteoarthritis, unspecified site: Secondary | ICD-10-CM | POA: Diagnosis not present

## 2016-08-28 DIAGNOSIS — R7303 Prediabetes: Secondary | ICD-10-CM | POA: Diagnosis not present

## 2016-08-28 DIAGNOSIS — K219 Gastro-esophageal reflux disease without esophagitis: Secondary | ICD-10-CM | POA: Diagnosis not present

## 2016-08-28 DIAGNOSIS — G2581 Restless legs syndrome: Secondary | ICD-10-CM | POA: Diagnosis not present

## 2016-08-28 DIAGNOSIS — I480 Paroxysmal atrial fibrillation: Secondary | ICD-10-CM | POA: Diagnosis not present

## 2016-08-28 DIAGNOSIS — E782 Mixed hyperlipidemia: Secondary | ICD-10-CM | POA: Diagnosis not present

## 2016-08-28 DIAGNOSIS — R69 Illness, unspecified: Secondary | ICD-10-CM | POA: Diagnosis not present

## 2016-08-28 DIAGNOSIS — N400 Enlarged prostate without lower urinary tract symptoms: Secondary | ICD-10-CM | POA: Diagnosis not present

## 2016-08-28 DIAGNOSIS — I10 Essential (primary) hypertension: Secondary | ICD-10-CM | POA: Diagnosis not present

## 2016-08-29 DIAGNOSIS — G2581 Restless legs syndrome: Secondary | ICD-10-CM | POA: Diagnosis not present

## 2016-08-29 DIAGNOSIS — N400 Enlarged prostate without lower urinary tract symptoms: Secondary | ICD-10-CM | POA: Diagnosis not present

## 2016-08-29 DIAGNOSIS — R55 Syncope and collapse: Secondary | ICD-10-CM | POA: Diagnosis not present

## 2016-08-31 DIAGNOSIS — I480 Paroxysmal atrial fibrillation: Secondary | ICD-10-CM | POA: Diagnosis not present

## 2016-08-31 DIAGNOSIS — E782 Mixed hyperlipidemia: Secondary | ICD-10-CM | POA: Diagnosis not present

## 2016-08-31 DIAGNOSIS — R7303 Prediabetes: Secondary | ICD-10-CM | POA: Diagnosis not present

## 2016-08-31 DIAGNOSIS — M1990 Unspecified osteoarthritis, unspecified site: Secondary | ICD-10-CM | POA: Diagnosis not present

## 2016-08-31 DIAGNOSIS — K219 Gastro-esophageal reflux disease without esophagitis: Secondary | ICD-10-CM | POA: Diagnosis not present

## 2016-08-31 DIAGNOSIS — I10 Essential (primary) hypertension: Secondary | ICD-10-CM | POA: Diagnosis not present

## 2016-08-31 DIAGNOSIS — R69 Illness, unspecified: Secondary | ICD-10-CM | POA: Diagnosis not present

## 2016-08-31 DIAGNOSIS — N400 Enlarged prostate without lower urinary tract symptoms: Secondary | ICD-10-CM | POA: Diagnosis not present

## 2016-08-31 DIAGNOSIS — G2581 Restless legs syndrome: Secondary | ICD-10-CM | POA: Diagnosis not present

## 2016-09-03 DIAGNOSIS — E782 Mixed hyperlipidemia: Secondary | ICD-10-CM | POA: Diagnosis not present

## 2016-09-03 DIAGNOSIS — K219 Gastro-esophageal reflux disease without esophagitis: Secondary | ICD-10-CM | POA: Diagnosis not present

## 2016-09-03 DIAGNOSIS — G2581 Restless legs syndrome: Secondary | ICD-10-CM | POA: Diagnosis not present

## 2016-09-03 DIAGNOSIS — I10 Essential (primary) hypertension: Secondary | ICD-10-CM | POA: Diagnosis not present

## 2016-09-03 DIAGNOSIS — I480 Paroxysmal atrial fibrillation: Secondary | ICD-10-CM | POA: Diagnosis not present

## 2016-09-03 DIAGNOSIS — R69 Illness, unspecified: Secondary | ICD-10-CM | POA: Diagnosis not present

## 2016-09-03 DIAGNOSIS — M1990 Unspecified osteoarthritis, unspecified site: Secondary | ICD-10-CM | POA: Diagnosis not present

## 2016-09-03 DIAGNOSIS — N400 Enlarged prostate without lower urinary tract symptoms: Secondary | ICD-10-CM | POA: Diagnosis not present

## 2016-09-03 DIAGNOSIS — R7303 Prediabetes: Secondary | ICD-10-CM | POA: Diagnosis not present

## 2016-09-04 DIAGNOSIS — G4719 Other hypersomnia: Secondary | ICD-10-CM | POA: Diagnosis not present

## 2016-09-04 DIAGNOSIS — R69 Illness, unspecified: Secondary | ICD-10-CM | POA: Diagnosis not present

## 2016-09-04 DIAGNOSIS — R5383 Other fatigue: Secondary | ICD-10-CM | POA: Diagnosis not present

## 2016-09-04 DIAGNOSIS — G2581 Restless legs syndrome: Secondary | ICD-10-CM | POA: Diagnosis not present

## 2016-09-06 DIAGNOSIS — K219 Gastro-esophageal reflux disease without esophagitis: Secondary | ICD-10-CM | POA: Diagnosis not present

## 2016-09-06 DIAGNOSIS — N400 Enlarged prostate without lower urinary tract symptoms: Secondary | ICD-10-CM | POA: Diagnosis not present

## 2016-09-06 DIAGNOSIS — R69 Illness, unspecified: Secondary | ICD-10-CM | POA: Diagnosis not present

## 2016-09-06 DIAGNOSIS — G2581 Restless legs syndrome: Secondary | ICD-10-CM | POA: Diagnosis not present

## 2016-09-06 DIAGNOSIS — I10 Essential (primary) hypertension: Secondary | ICD-10-CM | POA: Diagnosis not present

## 2016-09-06 DIAGNOSIS — E782 Mixed hyperlipidemia: Secondary | ICD-10-CM | POA: Diagnosis not present

## 2016-09-06 DIAGNOSIS — R7303 Prediabetes: Secondary | ICD-10-CM | POA: Diagnosis not present

## 2016-09-06 DIAGNOSIS — I480 Paroxysmal atrial fibrillation: Secondary | ICD-10-CM | POA: Diagnosis not present

## 2016-09-06 DIAGNOSIS — M1990 Unspecified osteoarthritis, unspecified site: Secondary | ICD-10-CM | POA: Diagnosis not present

## 2016-09-10 DIAGNOSIS — N400 Enlarged prostate without lower urinary tract symptoms: Secondary | ICD-10-CM | POA: Diagnosis not present

## 2016-09-10 DIAGNOSIS — G2581 Restless legs syndrome: Secondary | ICD-10-CM | POA: Diagnosis not present

## 2016-09-10 DIAGNOSIS — K219 Gastro-esophageal reflux disease without esophagitis: Secondary | ICD-10-CM | POA: Diagnosis not present

## 2016-09-10 DIAGNOSIS — R69 Illness, unspecified: Secondary | ICD-10-CM | POA: Diagnosis not present

## 2016-09-10 DIAGNOSIS — I10 Essential (primary) hypertension: Secondary | ICD-10-CM | POA: Diagnosis not present

## 2016-09-10 DIAGNOSIS — M1990 Unspecified osteoarthritis, unspecified site: Secondary | ICD-10-CM | POA: Diagnosis not present

## 2016-09-10 DIAGNOSIS — R7303 Prediabetes: Secondary | ICD-10-CM | POA: Diagnosis not present

## 2016-09-10 DIAGNOSIS — E782 Mixed hyperlipidemia: Secondary | ICD-10-CM | POA: Diagnosis not present

## 2016-09-10 DIAGNOSIS — I480 Paroxysmal atrial fibrillation: Secondary | ICD-10-CM | POA: Diagnosis not present

## 2016-09-11 DIAGNOSIS — N400 Enlarged prostate without lower urinary tract symptoms: Secondary | ICD-10-CM | POA: Diagnosis not present

## 2016-09-11 DIAGNOSIS — R7303 Prediabetes: Secondary | ICD-10-CM | POA: Diagnosis not present

## 2016-09-11 DIAGNOSIS — R32 Unspecified urinary incontinence: Secondary | ICD-10-CM | POA: Diagnosis not present

## 2016-09-11 DIAGNOSIS — E782 Mixed hyperlipidemia: Secondary | ICD-10-CM | POA: Diagnosis not present

## 2016-09-11 DIAGNOSIS — I10 Essential (primary) hypertension: Secondary | ICD-10-CM | POA: Diagnosis not present

## 2016-09-11 DIAGNOSIS — M1990 Unspecified osteoarthritis, unspecified site: Secondary | ICD-10-CM | POA: Diagnosis not present

## 2016-09-11 DIAGNOSIS — R69 Illness, unspecified: Secondary | ICD-10-CM | POA: Diagnosis not present

## 2016-09-11 DIAGNOSIS — I480 Paroxysmal atrial fibrillation: Secondary | ICD-10-CM | POA: Diagnosis not present

## 2016-09-11 DIAGNOSIS — G2581 Restless legs syndrome: Secondary | ICD-10-CM | POA: Diagnosis not present

## 2016-09-11 DIAGNOSIS — K219 Gastro-esophageal reflux disease without esophagitis: Secondary | ICD-10-CM | POA: Diagnosis not present

## 2016-09-13 DIAGNOSIS — R69 Illness, unspecified: Secondary | ICD-10-CM | POA: Diagnosis not present

## 2016-09-13 DIAGNOSIS — N400 Enlarged prostate without lower urinary tract symptoms: Secondary | ICD-10-CM | POA: Diagnosis not present

## 2016-09-13 DIAGNOSIS — I480 Paroxysmal atrial fibrillation: Secondary | ICD-10-CM | POA: Diagnosis not present

## 2016-09-13 DIAGNOSIS — I10 Essential (primary) hypertension: Secondary | ICD-10-CM | POA: Diagnosis not present

## 2016-09-13 DIAGNOSIS — K219 Gastro-esophageal reflux disease without esophagitis: Secondary | ICD-10-CM | POA: Diagnosis not present

## 2016-09-13 DIAGNOSIS — R7303 Prediabetes: Secondary | ICD-10-CM | POA: Diagnosis not present

## 2016-09-13 DIAGNOSIS — G2581 Restless legs syndrome: Secondary | ICD-10-CM | POA: Diagnosis not present

## 2016-09-13 DIAGNOSIS — M1990 Unspecified osteoarthritis, unspecified site: Secondary | ICD-10-CM | POA: Diagnosis not present

## 2016-09-13 DIAGNOSIS — E782 Mixed hyperlipidemia: Secondary | ICD-10-CM | POA: Diagnosis not present

## 2016-09-17 DIAGNOSIS — R69 Illness, unspecified: Secondary | ICD-10-CM | POA: Diagnosis not present

## 2016-09-17 DIAGNOSIS — I10 Essential (primary) hypertension: Secondary | ICD-10-CM | POA: Diagnosis not present

## 2016-09-17 DIAGNOSIS — R7303 Prediabetes: Secondary | ICD-10-CM | POA: Diagnosis not present

## 2016-09-17 DIAGNOSIS — M1990 Unspecified osteoarthritis, unspecified site: Secondary | ICD-10-CM | POA: Diagnosis not present

## 2016-09-17 DIAGNOSIS — N400 Enlarged prostate without lower urinary tract symptoms: Secondary | ICD-10-CM | POA: Diagnosis not present

## 2016-09-17 DIAGNOSIS — I480 Paroxysmal atrial fibrillation: Secondary | ICD-10-CM | POA: Diagnosis not present

## 2016-09-17 DIAGNOSIS — E782 Mixed hyperlipidemia: Secondary | ICD-10-CM | POA: Diagnosis not present

## 2016-09-17 DIAGNOSIS — K219 Gastro-esophageal reflux disease without esophagitis: Secondary | ICD-10-CM | POA: Diagnosis not present

## 2016-09-17 DIAGNOSIS — G2581 Restless legs syndrome: Secondary | ICD-10-CM | POA: Diagnosis not present

## 2016-09-18 DIAGNOSIS — N3941 Urge incontinence: Secondary | ICD-10-CM | POA: Diagnosis not present

## 2016-09-18 DIAGNOSIS — R32 Unspecified urinary incontinence: Secondary | ICD-10-CM | POA: Diagnosis not present

## 2016-09-21 DIAGNOSIS — R7303 Prediabetes: Secondary | ICD-10-CM | POA: Diagnosis not present

## 2016-09-21 DIAGNOSIS — I480 Paroxysmal atrial fibrillation: Secondary | ICD-10-CM | POA: Diagnosis not present

## 2016-09-21 DIAGNOSIS — M1990 Unspecified osteoarthritis, unspecified site: Secondary | ICD-10-CM | POA: Diagnosis not present

## 2016-09-21 DIAGNOSIS — K219 Gastro-esophageal reflux disease without esophagitis: Secondary | ICD-10-CM | POA: Diagnosis not present

## 2016-09-21 DIAGNOSIS — I10 Essential (primary) hypertension: Secondary | ICD-10-CM | POA: Diagnosis not present

## 2016-09-21 DIAGNOSIS — G2581 Restless legs syndrome: Secondary | ICD-10-CM | POA: Diagnosis not present

## 2016-09-21 DIAGNOSIS — R69 Illness, unspecified: Secondary | ICD-10-CM | POA: Diagnosis not present

## 2016-09-21 DIAGNOSIS — E782 Mixed hyperlipidemia: Secondary | ICD-10-CM | POA: Diagnosis not present

## 2016-09-21 DIAGNOSIS — N400 Enlarged prostate without lower urinary tract symptoms: Secondary | ICD-10-CM | POA: Diagnosis not present

## 2016-09-24 DIAGNOSIS — K219 Gastro-esophageal reflux disease without esophagitis: Secondary | ICD-10-CM | POA: Diagnosis not present

## 2016-09-24 DIAGNOSIS — G2581 Restless legs syndrome: Secondary | ICD-10-CM | POA: Diagnosis not present

## 2016-09-24 DIAGNOSIS — R69 Illness, unspecified: Secondary | ICD-10-CM | POA: Diagnosis not present

## 2016-09-24 DIAGNOSIS — N400 Enlarged prostate without lower urinary tract symptoms: Secondary | ICD-10-CM | POA: Diagnosis not present

## 2016-09-24 DIAGNOSIS — I10 Essential (primary) hypertension: Secondary | ICD-10-CM | POA: Diagnosis not present

## 2016-09-24 DIAGNOSIS — R7303 Prediabetes: Secondary | ICD-10-CM | POA: Diagnosis not present

## 2016-09-24 DIAGNOSIS — M1990 Unspecified osteoarthritis, unspecified site: Secondary | ICD-10-CM | POA: Diagnosis not present

## 2016-09-24 DIAGNOSIS — I480 Paroxysmal atrial fibrillation: Secondary | ICD-10-CM | POA: Diagnosis not present

## 2016-09-24 DIAGNOSIS — E782 Mixed hyperlipidemia: Secondary | ICD-10-CM | POA: Diagnosis not present

## 2016-10-11 DIAGNOSIS — N400 Enlarged prostate without lower urinary tract symptoms: Secondary | ICD-10-CM | POA: Diagnosis not present

## 2016-10-11 DIAGNOSIS — K219 Gastro-esophageal reflux disease without esophagitis: Secondary | ICD-10-CM | POA: Diagnosis not present

## 2016-10-11 DIAGNOSIS — I480 Paroxysmal atrial fibrillation: Secondary | ICD-10-CM | POA: Diagnosis not present

## 2016-10-11 DIAGNOSIS — I10 Essential (primary) hypertension: Secondary | ICD-10-CM | POA: Diagnosis not present

## 2016-10-11 DIAGNOSIS — M1990 Unspecified osteoarthritis, unspecified site: Secondary | ICD-10-CM | POA: Diagnosis not present

## 2016-10-11 DIAGNOSIS — G2581 Restless legs syndrome: Secondary | ICD-10-CM | POA: Diagnosis not present

## 2016-10-11 DIAGNOSIS — E782 Mixed hyperlipidemia: Secondary | ICD-10-CM | POA: Diagnosis not present

## 2016-10-11 DIAGNOSIS — R69 Illness, unspecified: Secondary | ICD-10-CM | POA: Diagnosis not present

## 2016-10-11 DIAGNOSIS — R7303 Prediabetes: Secondary | ICD-10-CM | POA: Diagnosis not present

## 2016-11-01 DIAGNOSIS — R270 Ataxia, unspecified: Secondary | ICD-10-CM | POA: Diagnosis not present

## 2016-11-01 DIAGNOSIS — G8929 Other chronic pain: Secondary | ICD-10-CM | POA: Diagnosis not present

## 2016-11-01 DIAGNOSIS — G2581 Restless legs syndrome: Secondary | ICD-10-CM | POA: Diagnosis not present

## 2016-11-01 DIAGNOSIS — M79673 Pain in unspecified foot: Secondary | ICD-10-CM | POA: Diagnosis not present

## 2016-11-08 DIAGNOSIS — D225 Melanocytic nevi of trunk: Secondary | ICD-10-CM | POA: Diagnosis not present

## 2016-11-08 DIAGNOSIS — L918 Other hypertrophic disorders of the skin: Secondary | ICD-10-CM | POA: Diagnosis not present

## 2016-11-08 DIAGNOSIS — L820 Inflamed seborrheic keratosis: Secondary | ICD-10-CM | POA: Diagnosis not present

## 2016-11-08 DIAGNOSIS — B079 Viral wart, unspecified: Secondary | ICD-10-CM | POA: Diagnosis not present

## 2016-11-08 DIAGNOSIS — L821 Other seborrheic keratosis: Secondary | ICD-10-CM | POA: Diagnosis not present

## 2016-11-08 DIAGNOSIS — L814 Other melanin hyperpigmentation: Secondary | ICD-10-CM | POA: Diagnosis not present

## 2016-11-08 DIAGNOSIS — L570 Actinic keratosis: Secondary | ICD-10-CM | POA: Diagnosis not present

## 2016-11-14 DIAGNOSIS — R32 Unspecified urinary incontinence: Secondary | ICD-10-CM | POA: Diagnosis not present

## 2016-11-15 DIAGNOSIS — M25519 Pain in unspecified shoulder: Secondary | ICD-10-CM | POA: Diagnosis not present

## 2016-11-15 DIAGNOSIS — N400 Enlarged prostate without lower urinary tract symptoms: Secondary | ICD-10-CM | POA: Diagnosis not present

## 2016-11-15 DIAGNOSIS — Z23 Encounter for immunization: Secondary | ICD-10-CM | POA: Diagnosis not present

## 2016-11-15 DIAGNOSIS — M79673 Pain in unspecified foot: Secondary | ICD-10-CM | POA: Diagnosis not present

## 2016-11-23 DIAGNOSIS — R32 Unspecified urinary incontinence: Secondary | ICD-10-CM | POA: Diagnosis not present

## 2016-11-23 DIAGNOSIS — N400 Enlarged prostate without lower urinary tract symptoms: Secondary | ICD-10-CM | POA: Diagnosis not present

## 2016-11-23 DIAGNOSIS — Z23 Encounter for immunization: Secondary | ICD-10-CM | POA: Diagnosis not present

## 2016-11-23 DIAGNOSIS — Z0000 Encounter for general adult medical examination without abnormal findings: Secondary | ICD-10-CM | POA: Diagnosis not present

## 2016-11-23 DIAGNOSIS — G2581 Restless legs syndrome: Secondary | ICD-10-CM | POA: Diagnosis not present

## 2016-11-23 DIAGNOSIS — Z9181 History of falling: Secondary | ICD-10-CM | POA: Diagnosis not present

## 2016-11-23 DIAGNOSIS — I480 Paroxysmal atrial fibrillation: Secondary | ICD-10-CM | POA: Diagnosis not present

## 2016-11-23 DIAGNOSIS — R69 Illness, unspecified: Secondary | ICD-10-CM | POA: Diagnosis not present

## 2016-11-23 DIAGNOSIS — I639 Cerebral infarction, unspecified: Secondary | ICD-10-CM | POA: Diagnosis not present

## 2016-11-30 DIAGNOSIS — Z0000 Encounter for general adult medical examination without abnormal findings: Secondary | ICD-10-CM | POA: Diagnosis not present

## 2016-11-30 DIAGNOSIS — R32 Unspecified urinary incontinence: Secondary | ICD-10-CM | POA: Diagnosis not present

## 2016-11-30 DIAGNOSIS — I639 Cerebral infarction, unspecified: Secondary | ICD-10-CM | POA: Diagnosis not present

## 2016-12-06 DIAGNOSIS — Z23 Encounter for immunization: Secondary | ICD-10-CM | POA: Diagnosis not present

## 2016-12-26 DIAGNOSIS — R32 Unspecified urinary incontinence: Secondary | ICD-10-CM | POA: Diagnosis not present

## 2016-12-26 DIAGNOSIS — I639 Cerebral infarction, unspecified: Secondary | ICD-10-CM | POA: Diagnosis not present

## 2016-12-26 DIAGNOSIS — I480 Paroxysmal atrial fibrillation: Secondary | ICD-10-CM | POA: Diagnosis not present

## 2016-12-26 DIAGNOSIS — R55 Syncope and collapse: Secondary | ICD-10-CM | POA: Diagnosis not present

## 2017-04-03 IMAGING — CR DG ABDOMEN ACUTE W/ 1V CHEST
4 series · 4 of 4 positions shown · non-contrast
Comparison: Chest radiograph 03/19/2014

CLINICAL DATA: Onset of weakness, dizziness and near syncope
following eating fish for dinner 1 hr ago, nausea, former smoker

EXAM:
DG ABDOMEN ACUTE W/ 1V CHEST

[w chest pa]
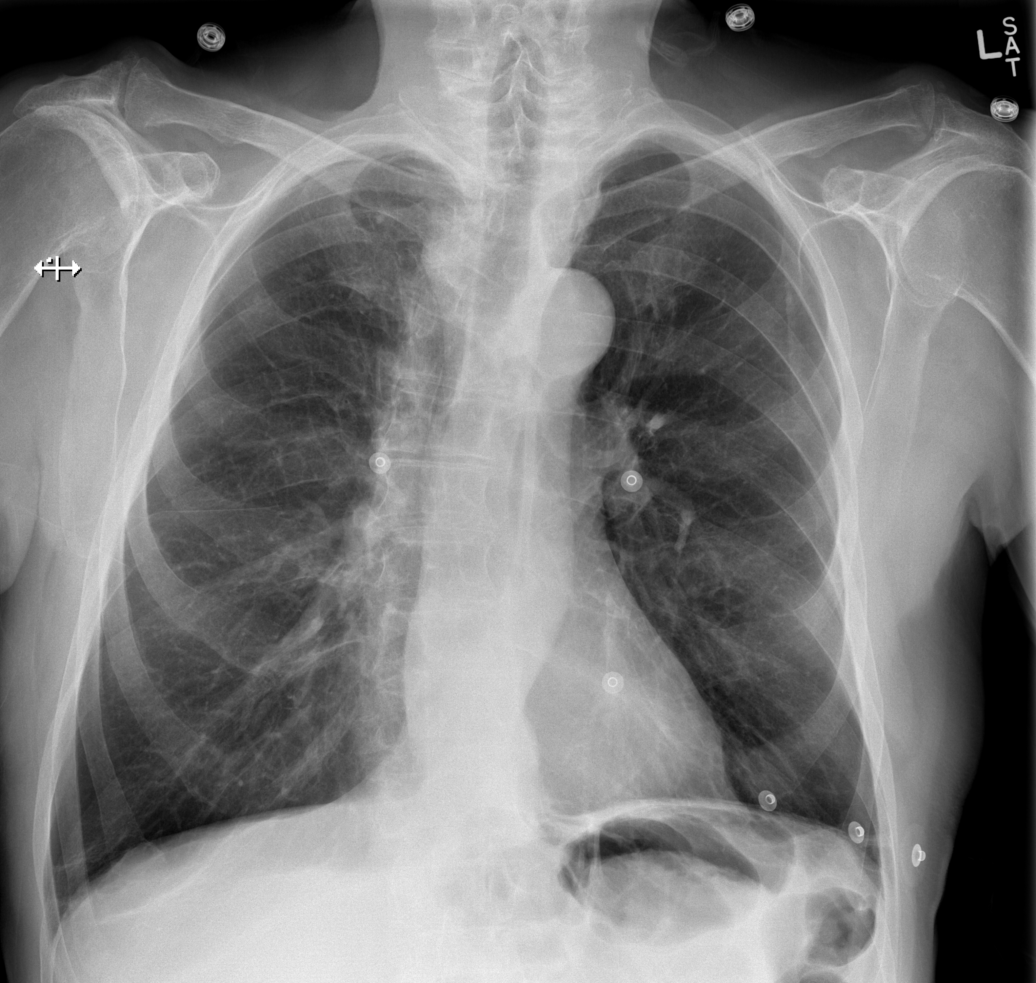

[w abdomen upright]
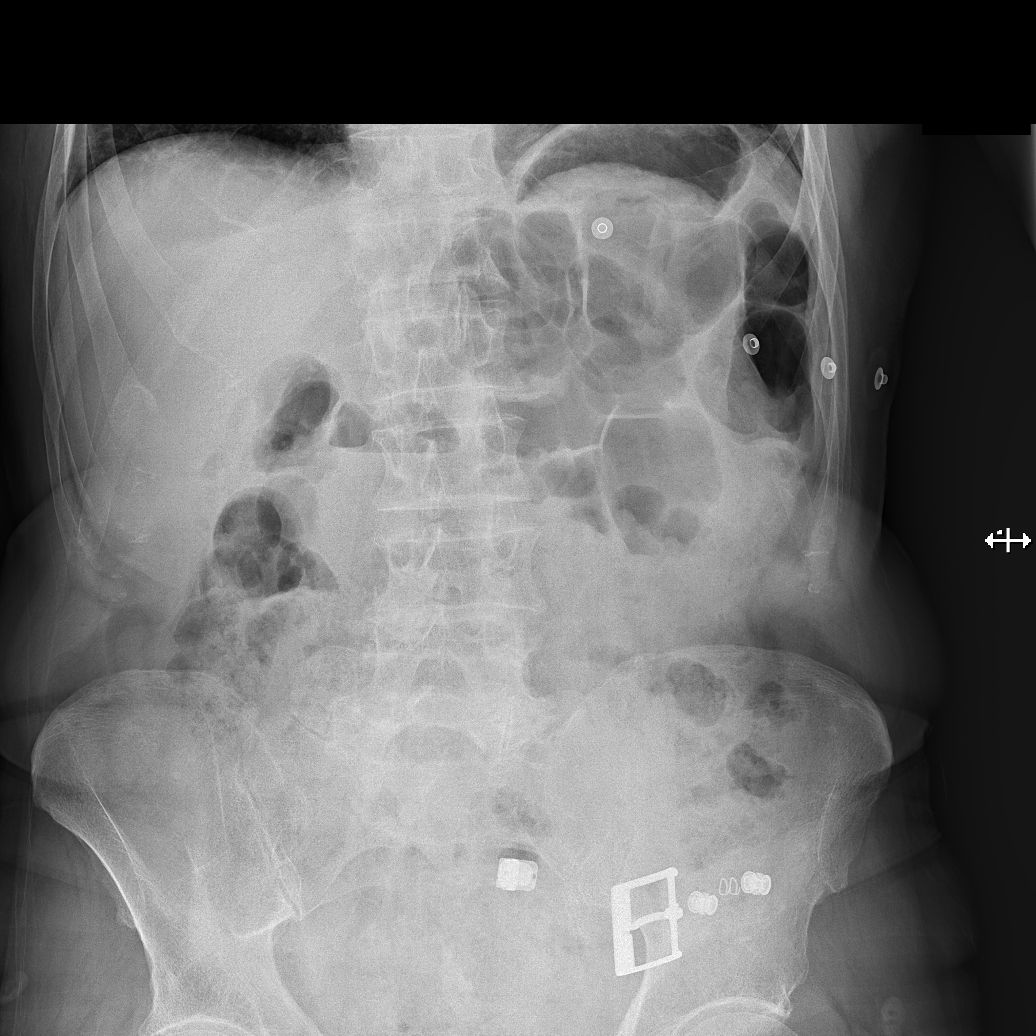

[t abdomen supine (1 of 2)]
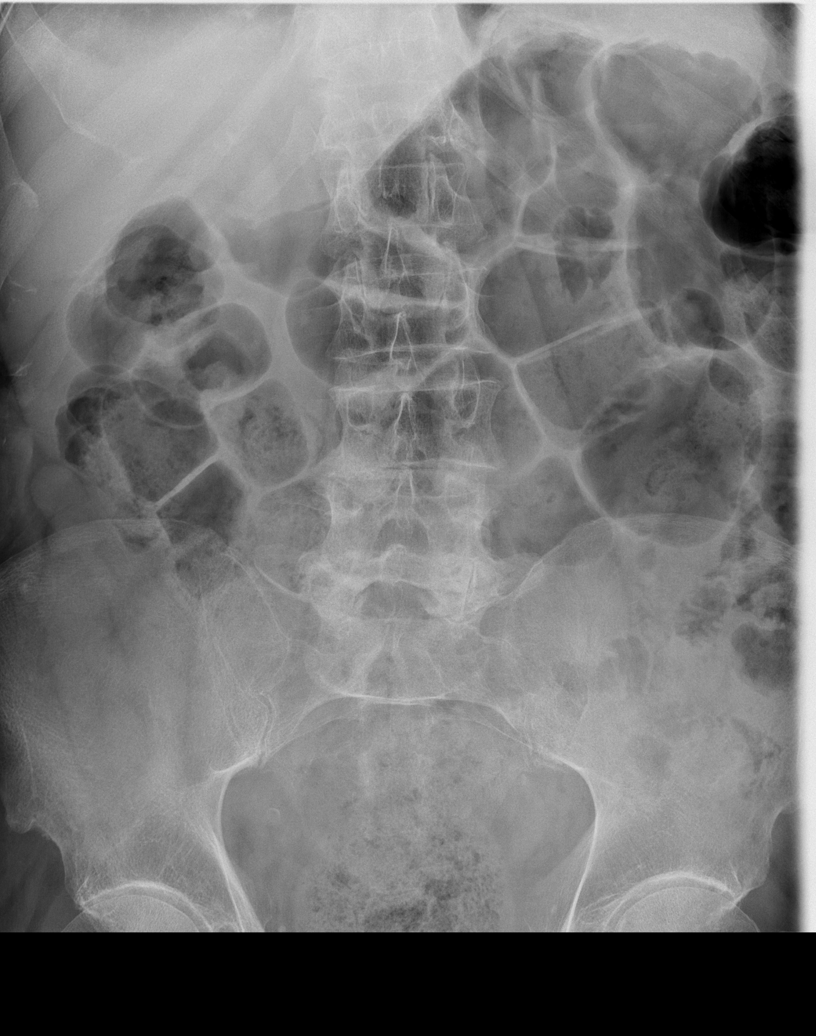

[t abdomen supine (2 of 2)]
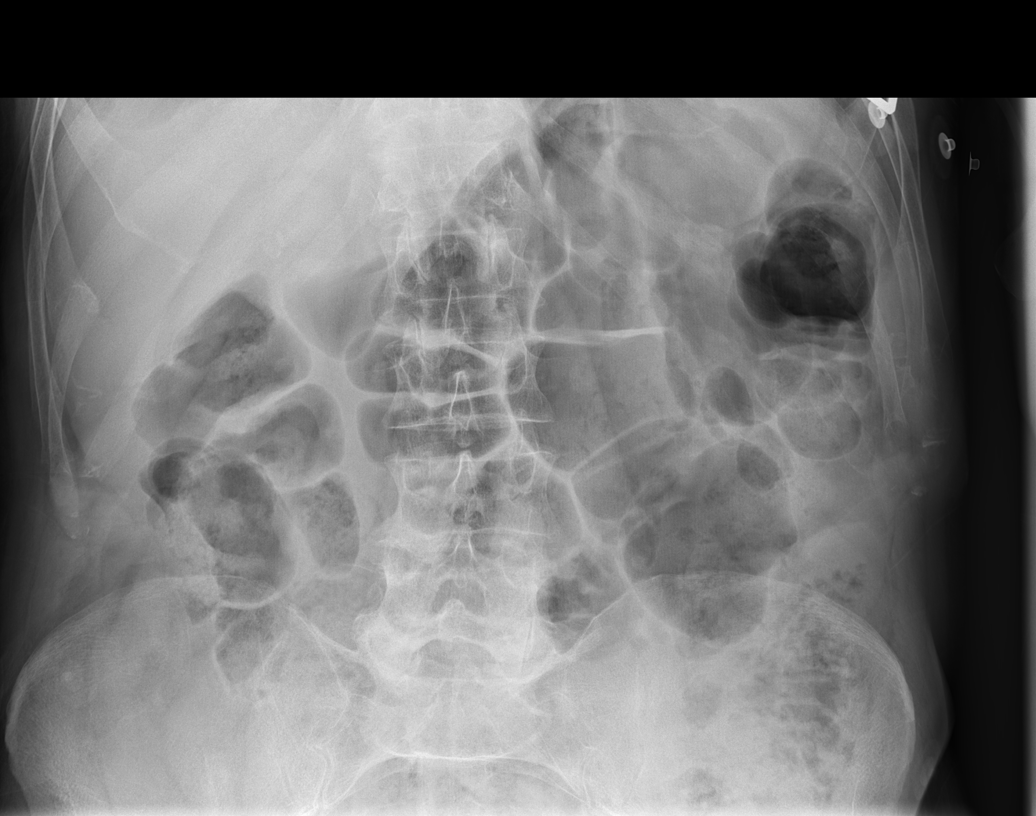

[4 of 4 positions shown; findings below may reference images not displayed]

FINDINGS: Normal heart size, mediastinal contours, and pulmonary vascularity.

Emphysematous changes without infiltrate, pleural effusion or
pneumothorax.

Bowel gas pattern normal.

No bowel dilatation, bowel wall thickening or free intraperitoneal
air.

Slight prominent stool in rectum.

Bones demineralized.

No urine tract calcification.
IMPRESSION: COPD changes.

No acute abdominal findings.

## 2018-08-15 IMAGING — DX DG CHEST 2V
2 series · 2 of 2 positions shown · non-contrast
Comparison: 08/13/2015, 03/19/2014 and 03/10/2013 chest x-ray.

CLINICAL DATA: 75-year-old male with chronic cough. Initial
encounter.

EXAM:
CHEST  2 VIEW

[chest pa]
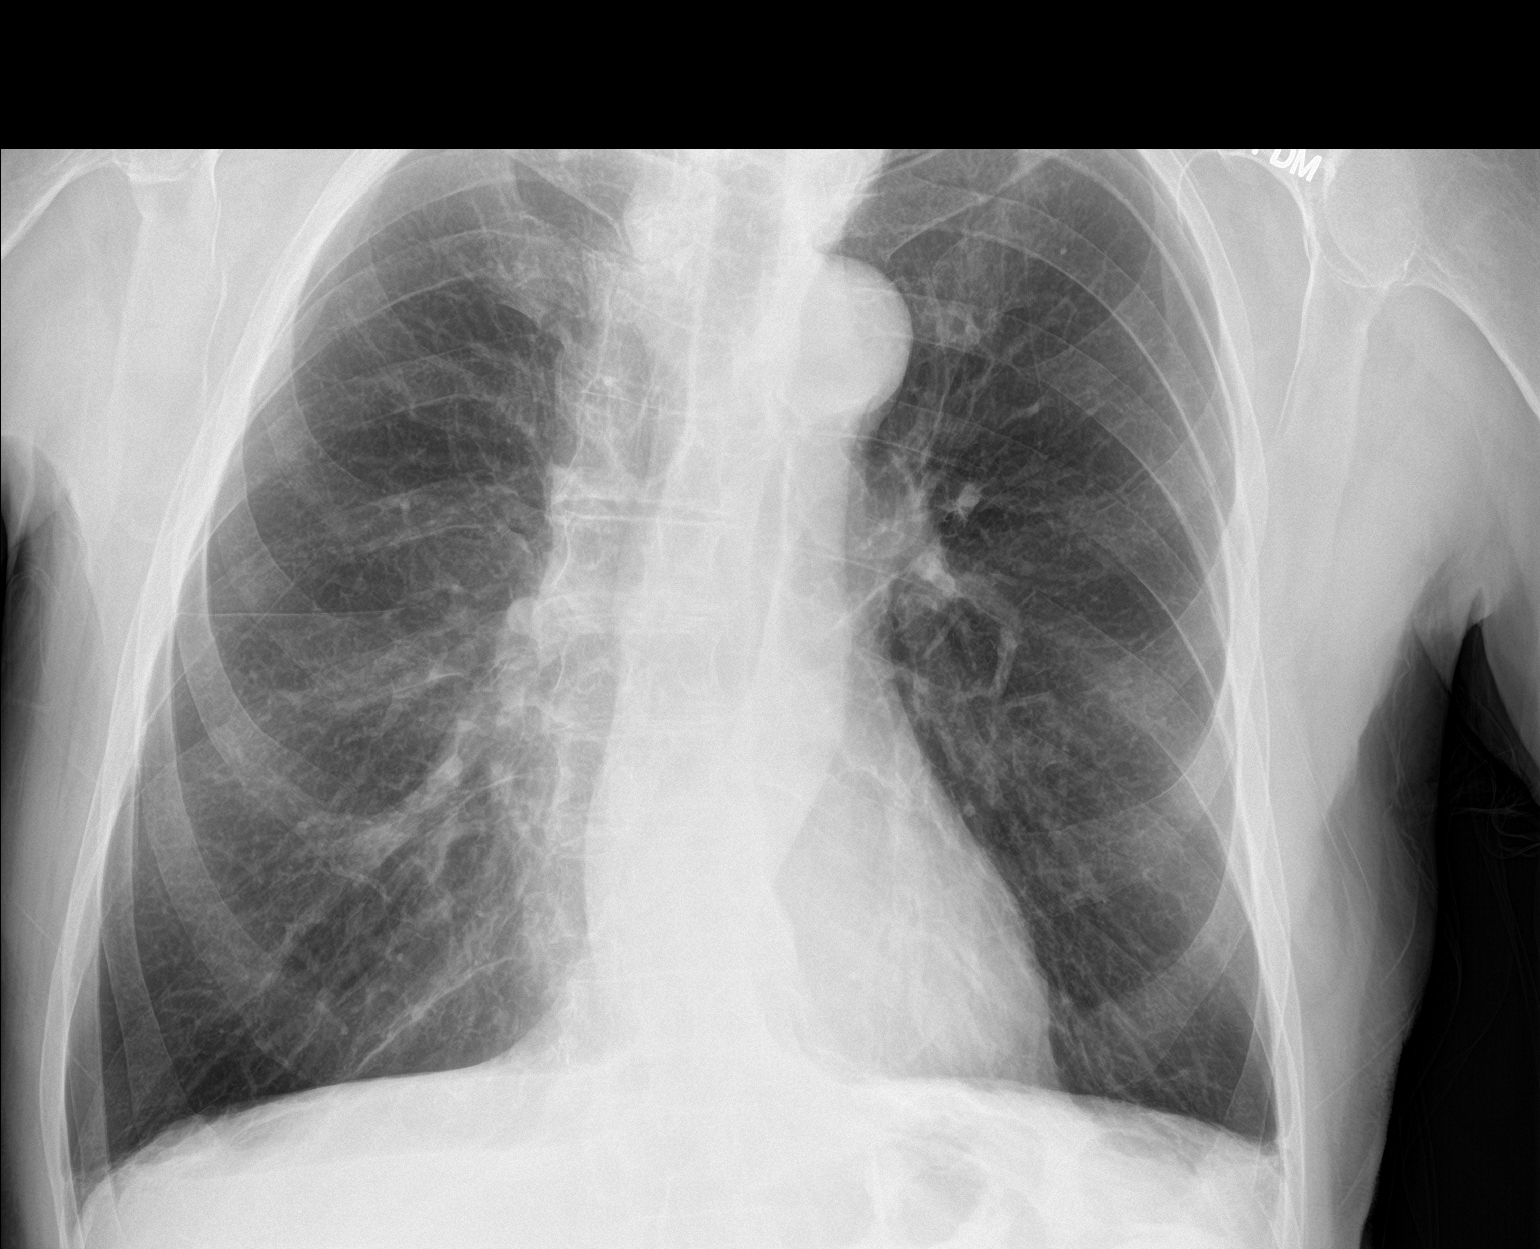

[chest lat]
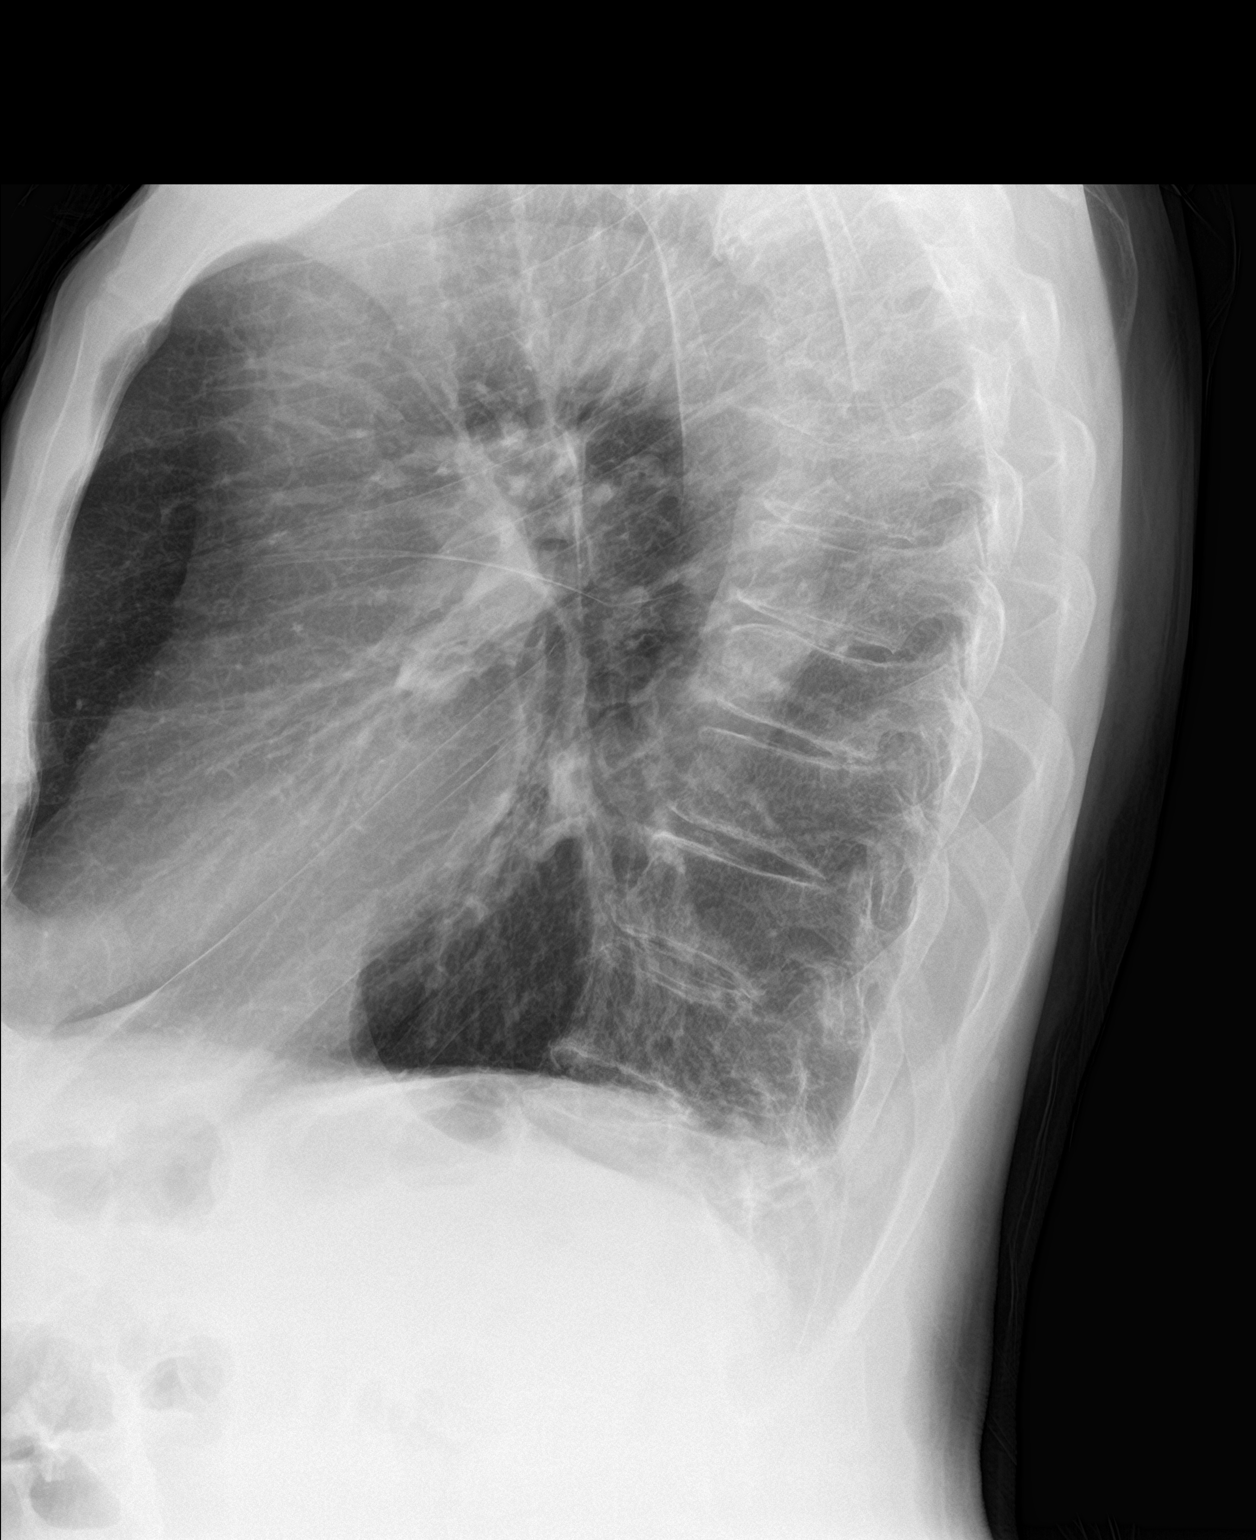

[2 of 2 positions shown; findings below may reference images not displayed]

FINDINGS: Bibasilar scarring stable. Minimal nodularity right lung apex
unchanged.

No infiltrate, congestive heart failure or pneumothorax.

Scoliosis thoracic spine convex to the right with mild distortion of
the mediastinal structures appearing similar to the prior exam.

No plain film evidence pulmonary malignancy. If further delineation
for chronic cough is clinically desired then CT imaging may be
considered.

Slightly tortuous aorta.  Heart size within normal limits.

Mild thoracic kyphosis and degenerative changes.
IMPRESSION: Stable chronic changes without acute abnormality noted. Please see
above discussion.
